# Patient Record
Sex: Female | Born: 1950 | Race: White | Hispanic: No | Marital: Married | Smoking: Former smoker
Health system: Southern US, Community
[De-identification: ages and names within clinical notes are randomized; demographics above are authoritative.]

## PROBLEM LIST (undated history)

## (undated) DIAGNOSIS — K649 Unspecified hemorrhoids: Secondary | ICD-10-CM

## (undated) DIAGNOSIS — K635 Polyp of colon: Secondary | ICD-10-CM

## (undated) DIAGNOSIS — E042 Nontoxic multinodular goiter: Secondary | ICD-10-CM

## (undated) DIAGNOSIS — F32A Depression, unspecified: Secondary | ICD-10-CM

## (undated) DIAGNOSIS — F419 Anxiety disorder, unspecified: Secondary | ICD-10-CM

## (undated) DIAGNOSIS — M159 Polyosteoarthritis, unspecified: Secondary | ICD-10-CM

## (undated) DIAGNOSIS — J449 Chronic obstructive pulmonary disease, unspecified: Secondary | ICD-10-CM

## (undated) DIAGNOSIS — E785 Hyperlipidemia, unspecified: Secondary | ICD-10-CM

## (undated) DIAGNOSIS — D649 Anemia, unspecified: Secondary | ICD-10-CM

## (undated) DIAGNOSIS — F329 Major depressive disorder, single episode, unspecified: Secondary | ICD-10-CM

## (undated) DIAGNOSIS — K5909 Other constipation: Secondary | ICD-10-CM

## (undated) DIAGNOSIS — K219 Gastro-esophageal reflux disease without esophagitis: Secondary | ICD-10-CM

## (undated) DIAGNOSIS — T4145XA Adverse effect of unspecified anesthetic, initial encounter: Secondary | ICD-10-CM

## (undated) DIAGNOSIS — M5030 Other cervical disc degeneration, unspecified cervical region: Secondary | ICD-10-CM

## (undated) DIAGNOSIS — Z720 Tobacco use: Secondary | ICD-10-CM

## (undated) DIAGNOSIS — N6019 Diffuse cystic mastopathy of unspecified breast: Secondary | ICD-10-CM

## (undated) DIAGNOSIS — Z9889 Other specified postprocedural states: Secondary | ICD-10-CM

## (undated) DIAGNOSIS — E039 Hypothyroidism, unspecified: Secondary | ICD-10-CM

## (undated) DIAGNOSIS — K469 Unspecified abdominal hernia without obstruction or gangrene: Secondary | ICD-10-CM

## (undated) DIAGNOSIS — N809 Endometriosis, unspecified: Secondary | ICD-10-CM

## (undated) DIAGNOSIS — T8859XA Other complications of anesthesia, initial encounter: Secondary | ICD-10-CM

## (undated) DIAGNOSIS — G43909 Migraine, unspecified, not intractable, without status migrainosus: Secondary | ICD-10-CM

## (undated) DIAGNOSIS — I10 Essential (primary) hypertension: Secondary | ICD-10-CM

## (undated) HISTORY — DX: Depression, unspecified: F32.A

## (undated) HISTORY — DX: Anxiety disorder, unspecified: F41.9

## (undated) HISTORY — DX: Hyperlipidemia, unspecified: E78.5

## (undated) HISTORY — PX: CARPAL TUNNEL RELEASE: SHX101

## (undated) HISTORY — DX: Essential (primary) hypertension: I10

## (undated) HISTORY — DX: Endometriosis, unspecified: N80.9

## (undated) HISTORY — DX: Migraine, unspecified, not intractable, without status migrainosus: G43.909

## (undated) HISTORY — DX: Polyosteoarthritis, unspecified: M15.9

## (undated) HISTORY — DX: Polyp of colon: K63.5

## (undated) HISTORY — PX: COLONOSCOPY: SHX174

## (undated) HISTORY — DX: Tobacco use: Z72.0

## (undated) HISTORY — PX: POLYPECTOMY: SHX149

## (undated) HISTORY — PX: APPENDECTOMY: SHX54

## (undated) HISTORY — PX: FOOT SURGERY: SHX648

## (undated) HISTORY — PX: HERNIA REPAIR: SHX51

## (undated) HISTORY — DX: Unspecified hemorrhoids: K64.9

## (undated) HISTORY — DX: Gastro-esophageal reflux disease without esophagitis: K21.9

## (undated) HISTORY — DX: Nontoxic multinodular goiter: E04.2

## (undated) HISTORY — PX: TONSILLECTOMY: SUR1361

## (undated) HISTORY — DX: Major depressive disorder, single episode, unspecified: F32.9

## (undated) HISTORY — DX: Diffuse cystic mastopathy of unspecified breast: N60.19

## (undated) HISTORY — DX: Unspecified abdominal hernia without obstruction or gangrene: K46.9

## (undated) HISTORY — PX: ROTATOR CUFF REPAIR: SHX139

## (undated) HISTORY — PX: ANTERIOR CERVICAL DECOMP/DISCECTOMY FUSION: SHX1161

## (undated) HISTORY — PX: ESOPHAGOGASTRODUODENOSCOPY: SHX1529

## (undated) HISTORY — PX: OTHER SURGICAL HISTORY: SHX169

## (undated) HISTORY — PX: ABDOMINAL HYSTERECTOMY: SHX81

## (undated) HISTORY — DX: Other constipation: K59.09

## (undated) HISTORY — DX: Hypothyroidism, unspecified: E03.9

## (undated) HISTORY — PX: TRIGGER FINGER RELEASE: SHX641

## (undated) HISTORY — DX: Other cervical disc degeneration, unspecified cervical region: M50.30

## (undated) HISTORY — DX: Anemia, unspecified: D64.9

## (undated) SURGERY — Surgical Case
Anesthesia: *Unknown

---
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
## 2005-04-17 ENCOUNTER — Encounter: Payer: Self-pay | Admitting: General Practice

## 2005-05-08 ENCOUNTER — Encounter: Payer: Self-pay | Admitting: General Practice

## 2005-06-13 ENCOUNTER — Ambulatory Visit: Payer: Self-pay | Admitting: Internal Medicine

## 2006-02-24 ENCOUNTER — Ambulatory Visit: Payer: Self-pay | Admitting: Otolaryngology

## 2006-04-06 ENCOUNTER — Ambulatory Visit: Payer: Self-pay | Admitting: General Practice

## 2006-04-21 ENCOUNTER — Encounter: Payer: Self-pay | Admitting: General Practice

## 2006-05-08 ENCOUNTER — Encounter: Payer: Self-pay | Admitting: General Practice

## 2006-06-15 ENCOUNTER — Ambulatory Visit: Payer: Self-pay | Admitting: Internal Medicine

## 2006-11-09 ENCOUNTER — Ambulatory Visit: Payer: Self-pay | Admitting: Internal Medicine

## 2007-06-22 ENCOUNTER — Ambulatory Visit: Payer: Self-pay | Admitting: Internal Medicine

## 2007-11-23 ENCOUNTER — Ambulatory Visit: Payer: Self-pay | Admitting: Internal Medicine

## 2008-06-29 ENCOUNTER — Ambulatory Visit: Payer: Self-pay | Admitting: Internal Medicine

## 2009-07-02 ENCOUNTER — Ambulatory Visit: Payer: Self-pay | Admitting: Internal Medicine

## 2010-02-11 ENCOUNTER — Ambulatory Visit: Payer: Self-pay | Admitting: Unknown Physician Specialty

## 2010-06-18 ENCOUNTER — Ambulatory Visit: Payer: Self-pay | Admitting: Otolaryngology

## 2010-07-04 ENCOUNTER — Ambulatory Visit: Payer: Self-pay | Admitting: Internal Medicine

## 2010-11-17 ENCOUNTER — Inpatient Hospital Stay: Payer: Self-pay | Admitting: Surgery

## 2010-11-23 ENCOUNTER — Emergency Department: Payer: Self-pay | Admitting: Emergency Medicine

## 2011-04-04 ENCOUNTER — Ambulatory Visit: Payer: Self-pay | Admitting: Internal Medicine

## 2011-07-10 ENCOUNTER — Ambulatory Visit: Payer: Self-pay | Admitting: Internal Medicine

## 2011-11-27 ENCOUNTER — Ambulatory Visit: Payer: Self-pay | Admitting: Internal Medicine

## 2012-01-21 HISTORY — PX: JOINT REPLACEMENT: SHX530

## 2012-07-14 ENCOUNTER — Ambulatory Visit: Payer: Self-pay | Admitting: Internal Medicine

## 2013-06-15 ENCOUNTER — Ambulatory Visit: Payer: Self-pay | Admitting: Internal Medicine

## 2013-07-15 ENCOUNTER — Ambulatory Visit: Payer: Self-pay | Admitting: Internal Medicine

## 2013-12-27 ENCOUNTER — Ambulatory Visit: Payer: Self-pay | Admitting: Cardiovascular Disease

## 2014-01-17 ENCOUNTER — Encounter: Payer: Self-pay | Admitting: Cardiovascular Disease

## 2014-01-17 ENCOUNTER — Ambulatory Visit (INDEPENDENT_AMBULATORY_CARE_PROVIDER_SITE_OTHER): Payer: BC Managed Care – PPO | Admitting: Cardiovascular Disease

## 2014-01-17 VITALS — BP 131/85 | HR 58 | Ht 60.0 in | Wt 148.2 lb

## 2014-01-17 DIAGNOSIS — I10 Essential (primary) hypertension: Secondary | ICD-10-CM

## 2014-01-17 DIAGNOSIS — R000 Tachycardia, unspecified: Secondary | ICD-10-CM

## 2014-01-17 DIAGNOSIS — F17200 Nicotine dependence, unspecified, uncomplicated: Secondary | ICD-10-CM

## 2014-01-17 DIAGNOSIS — E785 Hyperlipidemia, unspecified: Secondary | ICD-10-CM

## 2014-01-17 DIAGNOSIS — Z720 Tobacco use: Secondary | ICD-10-CM | POA: Insufficient documentation

## 2014-01-17 DIAGNOSIS — R0789 Other chest pain: Secondary | ICD-10-CM | POA: Insufficient documentation

## 2014-01-17 DIAGNOSIS — R079 Chest pain, unspecified: Secondary | ICD-10-CM

## 2014-01-17 NOTE — Progress Notes (Signed)
Primary care physician: Dr. Doy Hutching  HPI  This is a pleasant 63 year old female who is self-referred for evaluation of chest pain. She has no previous cardiac history. She reports having a normal stress test 2 years ago. She has multiple chronic medical conditions that include hypertension, hyperlipidemia, gastroesophageal reflux disease, tobacco use and COPD. She reports recurrent sporadic episodes of chest discomfort over the last 2 years. Had initial episode happened at night and woke her up from sleep. The discomfort was substernal and lasted for less than 1 minute. Since then, she had multiple brief episodes similar in nature. The pain is described as sharp with no radiation. It has no aggravating factors. All the episodes happened at rest and not with physical activities. She does complain of exertional dyspnea and fatigue and had attributed this to prolonged history of smoking. She has no family history of premature coronary artery disease. She works in a bank.  No Known Allergies   No current outpatient prescriptions on file prior to visit.   No current facility-administered medications on file prior to visit.     Past Medical History  Diagnosis Date  . Nontoxic multinodular goiter   . Osteoarthrosis involving, or with mention of more than one site, but not specified as generalized, multiple sites   . Hypothyroidism   . Chronic airway obstruction, not elsewhere classified   . Tobacco use   . Essential hypertension, benign   . Hyperlipidemia      Past Surgical History  Procedure Laterality Date  . Abdominal hysterectomy    . Hernia repair    . Rotator cuff repair Left   . Carpal tunnel release Bilateral   . Trigger finger release    . Thumb surgery       Family History  Problem Relation Age of Onset  . Hypertension Mother   . Clotting disorder Mother   . Hypertension Father      History   Social History  . Marital Status: Married    Spouse Name: N/A   Number of Children: N/A  . Years of Education: N/A   Occupational History  . Not on file.   Social History Main Topics  . Smoking status: Current Every Day Smoker -- 0.25 packs/day for 40 years    Types: Cigarettes  . Smokeless tobacco: Not on file  . Alcohol Use: Yes     Comment: occassional  . Drug Use: No  . Sexual Activity: Not on file   Other Topics Concern  . Not on file   Social History Narrative  . No narrative on file     ROS A 10 point review of system was performed. It is negative other than that mentioned in the history of present illness.   PHYSICAL EXAM   BP 131/85  Pulse 58  Ht 5' (1.524 m)  Wt 148 lb 4 oz (67.246 kg)  BMI 28.95 kg/m2 Constitutional: She is oriented to person, place, and time. She appears well-developed and well-nourished. No distress.  HENT: No nasal discharge.  Head: Normocephalic and atraumatic.  Eyes: Pupils are equal and round. No discharge.  Neck: Normal range of motion. Neck supple. No JVD present. No thyromegaly present.  Cardiovascular: Normal rate, regular rhythm, normal heart sounds. Exam reveals no gallop and no friction rub. No murmur heard.  Pulmonary/Chest: Effort normal and breath sounds normal. No stridor. No respiratory distress. She has no wheezes. She has no rales. She exhibits no tenderness.  Abdominal: Soft. Bowel sounds are normal. She exhibits  no distension. There is no tenderness. There is no rebound and no guarding.  Musculoskeletal: Normal range of motion. She exhibits no edema and no tenderness.  Neurological: She is alert and oriented to person, place, and time. Coordination normal.  Skin: Skin is warm and dry. No rash noted. She is not diaphoretic. No erythema. No pallor.  Psychiatric: She has a normal mood and affect. Her behavior is normal. Judgment and thought content normal.     EKG: Sinus bradycardia with no significant ST or T wave changes.   ASSESSMENT AND PLAN

## 2014-01-17 NOTE — Assessment & Plan Note (Signed)
She is on pravastatin 40 mg once daily.

## 2014-01-17 NOTE — Assessment & Plan Note (Signed)
I discussed smoking cessation. 

## 2014-01-17 NOTE — Assessment & Plan Note (Signed)
The chest pain is overall atypical and has been sporadic over the last 2 years. Physical exam is unremarkable and baseline EKG is normal. Associated symptoms include exertional dyspnea which is more concerning. She has multiple risk factors for coronary artery disease. I recommend evaluation with a treadmill stress test. I discussed with the patient the importance of lifestyle changes in order to decrease the chance of future coronary artery disease and cardiovascular events. We discussed the importance of controlling risk factors, healthy diet , smoking cessation as well as regular exercise. I also explained to him that a normal stress test does not rule out atherosclerosis.

## 2014-01-17 NOTE — Assessment & Plan Note (Signed)
Blood pressure is controlled on losartan. 

## 2014-01-17 NOTE — Patient Instructions (Signed)
Your physician has requested that you have an exercise tolerance test. For further information please visit [email or web address]. Please also follow instruction sheet, as given.  Follow up as needed.

## 2014-02-02 ENCOUNTER — Encounter: Payer: BC Managed Care – PPO | Admitting: Cardiovascular Disease

## 2014-06-26 DIAGNOSIS — F419 Anxiety disorder, unspecified: Secondary | ICD-10-CM

## 2014-06-26 DIAGNOSIS — E042 Nontoxic multinodular goiter: Secondary | ICD-10-CM | POA: Insufficient documentation

## 2014-06-26 DIAGNOSIS — J449 Chronic obstructive pulmonary disease, unspecified: Secondary | ICD-10-CM | POA: Insufficient documentation

## 2014-06-26 DIAGNOSIS — F329 Major depressive disorder, single episode, unspecified: Secondary | ICD-10-CM | POA: Insufficient documentation

## 2014-06-26 DIAGNOSIS — M5030 Other cervical disc degeneration, unspecified cervical region: Secondary | ICD-10-CM | POA: Insufficient documentation

## 2014-06-26 DIAGNOSIS — E039 Hypothyroidism, unspecified: Secondary | ICD-10-CM | POA: Insufficient documentation

## 2014-06-26 DIAGNOSIS — M1990 Unspecified osteoarthritis, unspecified site: Secondary | ICD-10-CM | POA: Insufficient documentation

## 2014-09-21 ENCOUNTER — Ambulatory Visit: Payer: Self-pay | Admitting: Internal Medicine

## 2014-12-10 ENCOUNTER — Ambulatory Visit: Payer: Self-pay | Admitting: Physician Assistant

## 2015-02-23 DIAGNOSIS — K219 Gastro-esophageal reflux disease without esophagitis: Secondary | ICD-10-CM | POA: Insufficient documentation

## 2015-03-26 ENCOUNTER — Ambulatory Visit
Admit: 2015-03-26 | Disposition: A | Discharge status: Home / Self Care | Payer: Self-pay | Attending: Unknown Physician Specialty | Admitting: Unknown Physician Specialty

## 2015-08-09 ENCOUNTER — Ambulatory Visit: Payer: BLUE CROSS/BLUE SHIELD | Admitting: Urology

## 2015-08-22 ENCOUNTER — Ambulatory Visit: Payer: BLUE CROSS/BLUE SHIELD | Admitting: Urology

## 2015-08-27 ENCOUNTER — Ambulatory Visit: Payer: Self-pay | Admitting: Obstetrics and Gynecology

## 2015-08-28 ENCOUNTER — Ambulatory Visit: Payer: Self-pay | Admitting: Obstetrics and Gynecology

## 2016-01-15 ENCOUNTER — Other Ambulatory Visit: Payer: Self-pay | Admitting: Internal Medicine

## 2016-01-15 DIAGNOSIS — Z1231 Encounter for screening mammogram for malignant neoplasm of breast: Secondary | ICD-10-CM

## 2016-01-17 ENCOUNTER — Ambulatory Visit
Admission: RE | Admit: 2016-01-17 | Discharge: 2016-01-17 | Disposition: A | Discharge status: Home / Self Care | Payer: BLUE CROSS/BLUE SHIELD | Source: Ambulatory Visit | Attending: Internal Medicine | Admitting: Internal Medicine

## 2016-01-17 DIAGNOSIS — Z1231 Encounter for screening mammogram for malignant neoplasm of breast: Secondary | ICD-10-CM

## 2016-04-01 ENCOUNTER — Other Ambulatory Visit: Payer: Self-pay | Admitting: Internal Medicine

## 2016-04-01 DIAGNOSIS — R1084 Generalized abdominal pain: Secondary | ICD-10-CM

## 2016-04-08 ENCOUNTER — Ambulatory Visit: Payer: BLUE CROSS/BLUE SHIELD

## 2016-12-12 ENCOUNTER — Other Ambulatory Visit: Payer: Self-pay | Admitting: Internal Medicine

## 2016-12-12 DIAGNOSIS — Z1231 Encounter for screening mammogram for malignant neoplasm of breast: Secondary | ICD-10-CM

## 2017-01-19 ENCOUNTER — Ambulatory Visit
Admission: RE | Admit: 2017-01-19 | Discharge: 2017-01-19 | Disposition: A | Discharge status: Home / Self Care | Payer: BLUE CROSS/BLUE SHIELD | Source: Ambulatory Visit | Attending: Internal Medicine | Admitting: Internal Medicine

## 2017-01-19 DIAGNOSIS — Z1231 Encounter for screening mammogram for malignant neoplasm of breast: Secondary | ICD-10-CM | POA: Insufficient documentation

## 2017-02-06 ENCOUNTER — Other Ambulatory Visit: Payer: Self-pay | Admitting: Internal Medicine

## 2017-02-06 DIAGNOSIS — R1310 Dysphagia, unspecified: Secondary | ICD-10-CM

## 2017-02-06 DIAGNOSIS — K219 Gastro-esophageal reflux disease without esophagitis: Secondary | ICD-10-CM

## 2017-02-11 ENCOUNTER — Other Ambulatory Visit: Payer: Self-pay | Admitting: Internal Medicine

## 2017-02-11 DIAGNOSIS — R1084 Generalized abdominal pain: Secondary | ICD-10-CM

## 2017-02-25 ENCOUNTER — Ambulatory Visit
Admission: RE | Admit: 2017-02-25 | Discharge: 2017-02-25 | Disposition: A | Discharge status: Home / Self Care | Payer: BLUE CROSS/BLUE SHIELD | Source: Ambulatory Visit | Attending: Internal Medicine | Admitting: Internal Medicine

## 2017-02-25 DIAGNOSIS — K760 Fatty (change of) liver, not elsewhere classified: Secondary | ICD-10-CM | POA: Insufficient documentation

## 2017-02-25 DIAGNOSIS — M5136 Other intervertebral disc degeneration, lumbar region: Secondary | ICD-10-CM | POA: Diagnosis not present

## 2017-02-25 DIAGNOSIS — R1084 Generalized abdominal pain: Secondary | ICD-10-CM | POA: Insufficient documentation

## 2017-02-25 LAB — POCT I-STAT CREATININE: CREATININE: 0.6 mg/dL (ref 0.44–1.00)

## 2017-02-25 MED ORDER — IOPAMIDOL (ISOVUE-300) INJECTION 61%
100.0000 mL | Freq: Once | INTRAVENOUS | Status: AC | PRN
  Administered 2017-02-25: 100 mL via INTRAVENOUS

## 2017-02-26 ENCOUNTER — Ambulatory Visit: Payer: BLUE CROSS/BLUE SHIELD

## 2017-03-17 DIAGNOSIS — Z9889 Other specified postprocedural states: Secondary | ICD-10-CM

## 2017-03-17 HISTORY — DX: Other specified postprocedural states: Z98.890

## 2017-03-24 ENCOUNTER — Ambulatory Visit
Admission: RE | Admit: 2017-03-24 | Discharge: 2017-03-24 | Disposition: A | Discharge status: Home / Self Care | Payer: BLUE CROSS/BLUE SHIELD | Source: Ambulatory Visit | Attending: Internal Medicine | Admitting: Internal Medicine

## 2017-03-24 DIAGNOSIS — R1310 Dysphagia, unspecified: Secondary | ICD-10-CM

## 2017-03-24 DIAGNOSIS — K5909 Other constipation: Secondary | ICD-10-CM | POA: Diagnosis not present

## 2017-03-24 DIAGNOSIS — K449 Diaphragmatic hernia without obstruction or gangrene: Secondary | ICD-10-CM | POA: Insufficient documentation

## 2017-03-24 DIAGNOSIS — R1084 Generalized abdominal pain: Secondary | ICD-10-CM | POA: Insufficient documentation

## 2017-03-24 DIAGNOSIS — K219 Gastro-esophageal reflux disease without esophagitis: Secondary | ICD-10-CM

## 2017-04-03 ENCOUNTER — Encounter
Admission: RE | Disposition: A | Discharge status: Home / Self Care | Payer: Self-pay | Source: Ambulatory Visit | Attending: Unknown Physician Specialty

## 2017-04-03 ENCOUNTER — Encounter: Payer: Self-pay | Admitting: *Deleted

## 2017-04-03 ENCOUNTER — Ambulatory Visit: Payer: BLUE CROSS/BLUE SHIELD | Admitting: Anesthesiology

## 2017-04-03 ENCOUNTER — Ambulatory Visit
Admission: RE | Admit: 2017-04-03 | Discharge: 2017-04-03 | Disposition: A | Discharge status: Home / Self Care | Payer: BLUE CROSS/BLUE SHIELD | Source: Ambulatory Visit | Attending: Unknown Physician Specialty | Admitting: Unknown Physician Specialty

## 2017-04-03 DIAGNOSIS — Z9049 Acquired absence of other specified parts of digestive tract: Secondary | ICD-10-CM | POA: Insufficient documentation

## 2017-04-03 DIAGNOSIS — E785 Hyperlipidemia, unspecified: Secondary | ICD-10-CM | POA: Diagnosis not present

## 2017-04-03 DIAGNOSIS — Z79890 Hormone replacement therapy (postmenopausal): Secondary | ICD-10-CM | POA: Diagnosis not present

## 2017-04-03 DIAGNOSIS — Z79899 Other long term (current) drug therapy: Secondary | ICD-10-CM | POA: Insufficient documentation

## 2017-04-03 DIAGNOSIS — K219 Gastro-esophageal reflux disease without esophagitis: Secondary | ICD-10-CM | POA: Diagnosis not present

## 2017-04-03 DIAGNOSIS — I10 Essential (primary) hypertension: Secondary | ICD-10-CM | POA: Diagnosis not present

## 2017-04-03 DIAGNOSIS — F329 Major depressive disorder, single episode, unspecified: Secondary | ICD-10-CM | POA: Diagnosis not present

## 2017-04-03 DIAGNOSIS — R1310 Dysphagia, unspecified: Secondary | ICD-10-CM | POA: Diagnosis present

## 2017-04-03 DIAGNOSIS — Z87891 Personal history of nicotine dependence: Secondary | ICD-10-CM | POA: Insufficient documentation

## 2017-04-03 DIAGNOSIS — F419 Anxiety disorder, unspecified: Secondary | ICD-10-CM | POA: Insufficient documentation

## 2017-04-03 HISTORY — PX: ESOPHAGOGASTRODUODENOSCOPY (EGD) WITH PROPOFOL: SHX5813

## 2017-04-03 HISTORY — DX: Other specified postprocedural states: Z98.890

## 2017-04-03 SURGERY — ESOPHAGOGASTRODUODENOSCOPY (EGD) WITH PROPOFOL
Anesthesia: General

## 2017-04-03 MED ORDER — PROPOFOL 10 MG/ML IV BOLUS
INTRAVENOUS | Status: AC
  Filled 2017-04-03: qty 20

## 2017-04-03 MED ORDER — GENTAMICIN SULFATE 40 MG/ML IJ SOLN
70.0000 mg | Freq: Once | INTRAVENOUS | Status: AC
  Administered 2017-04-03: 70 mg via INTRAVENOUS
  Filled 2017-04-03: qty 1.75

## 2017-04-03 MED ORDER — SODIUM CHLORIDE 0.9 % IV SOLN: INTRAVENOUS | Status: DC

## 2017-04-03 MED ORDER — VANCOMYCIN HCL 500 MG IV SOLR
500.0000 mg | Freq: Once | INTRAVENOUS | Status: AC
  Administered 2017-04-03: 500 mg via INTRAVENOUS
  Filled 2017-04-03: qty 500

## 2017-04-03 MED ORDER — PROPOFOL 10 MG/ML IV BOLUS
INTRAVENOUS | Status: DC | PRN
  Administered 2017-04-03 (×2): 30 mg via INTRAVENOUS
  Administered 2017-04-03: 80 mg via INTRAVENOUS
  Administered 2017-04-03 (×2): 20 mg via INTRAVENOUS

## 2017-04-03 MED ORDER — SODIUM CHLORIDE 0.9 % IV SOLN
INTRAVENOUS | Status: DC
  Administered 2017-04-03: 14:00:00 via INTRAVENOUS

## 2017-04-03 NOTE — Transfer of Care (Signed)
Immediate Anesthesia Transfer of Care Note  Patient: Janet Osborne  Procedure(s) Performed: Procedure(s): ESOPHAGOGASTRODUODENOSCOPY (EGD) WITH PROPOFOL (N/A)  Patient Location: PACU and Endoscopy Unit  Anesthesia Type:General  Level of Consciousness: drowsy and patient cooperative  Airway & Oxygen Therapy: Patient Spontanous Breathing and Patient connected to nasal cannula oxygen  Post-op Assessment: Report given to RN and Post -op Vital signs reviewed and stable  Post vital signs: Reviewed and stable  Last Vitals:  Vitals:   04/03/17 1110 04/03/17 1400  BP: (!) 105/42 (!) 98/53  Pulse: (!) 55 (!) 55  Resp: 18 16  Temp: (!) 35.8 C     Last Pain:  Vitals:   04/03/17 1110  TempSrc: Tympanic         Complications: No apparent anesthesia complications

## 2017-04-03 NOTE — Anesthesia Preprocedure Evaluation (Signed)
Anesthesia Evaluation  Patient identified by MRN, date of birth, ID band Patient awake    Reviewed: Allergy & Precautions, NPO status , Patient's Chart, lab work & pertinent test results  History of Anesthesia Complications Negative for: history of anesthetic complications  Airway Mallampati: II  TM Distance: >3 FB Neck ROM: Full    Dental no notable dental hx.    Pulmonary neg sleep apnea, neg COPD, former smoker,    breath sounds clear to auscultation- rhonchi (-) wheezing      Cardiovascular hypertension, Pt. on medications (-) CAD and (-) Past MI  Rhythm:Regular Rate:Normal - Systolic murmurs and - Diastolic murmurs    Neuro/Psych  Headaches, PSYCHIATRIC DISORDERS Anxiety Depression    GI/Hepatic Neg liver ROS, GERD  ,  Endo/Other  neg diabetesHypothyroidism   Renal/GU negative Renal ROS     Musculoskeletal  (+) Arthritis ,   Abdominal (+) - obese,   Peds  Hematology  (+) anemia ,   Anesthesia Other Findings Past Medical History: No date: Abdominal hernia No date: Anemia No date: Anxiety No date: Chronic constipation No date: Colon polyps No date: DDD (degenerative disc disease), cervical No date: Depression No date: Endometriosis No date: Essential hypertension, benign No date: Fibrocystic breast disease No date: GERD (gastroesophageal reflux disease) 03/17/2017: H/O hand surgery No date: Hemorrhoids No date: Hyperlipidemia No date: Migraine No date: Nontoxic multinodular goiter No date: Osteoarthrosis involving, or with mention of m* No date: Tobacco use   Reproductive/Obstetrics                             Anesthesia Physical Anesthesia Plan  ASA: II  Anesthesia Plan: General   Post-op Pain Management:    Induction: Intravenous  Airway Management Planned: Natural Airway  Additional Equipment:   Intra-op Plan:   Post-operative Plan:   Informed Consent: I  have reviewed the patients History and Physical, chart, labs and discussed the procedure including the risks, benefits and alternatives for the proposed anesthesia with the patient or authorized representative who has indicated his/her understanding and acceptance.   Dental advisory given  Plan Discussed with: CRNA and Anesthesiologist  Anesthesia Plan Comments:         Anesthesia Quick Evaluation

## 2017-04-03 NOTE — H&P (Signed)
Primary Care Physician:  Idelle Crouch, MD Primary Gastroenterologist:  Dr. Vira Agar  Pre-Procedure History & Physical: HPI:  Janet Osborne is a 66 y.o. female is here for an endoscopy.   Past Medical History:  Diagnosis Date  . Abdominal hernia   . Anemia   . Anxiety   . Chronic constipation   . Colon polyps   . DDD (degenerative disc disease), cervical   . Depression   . Endometriosis   . Essential hypertension, benign   . Fibrocystic breast disease   . GERD (gastroesophageal reflux disease)   . H/O hand surgery 03/17/2017  . Hemorrhoids   . Hyperlipidemia   . Migraine   . Nontoxic multinodular goiter   . Osteoarthrosis involving, or with mention of more than one site, but not specified as generalized, multiple sites   . Tobacco use     Past Surgical History:  Procedure Laterality Date  . ABDOMINAL HYSTERECTOMY    . ANTERIOR CERVICAL DECOMP/DISCECTOMY FUSION    . APPENDECTOMY    . CARPAL TUNNEL RELEASE Bilateral   . COLONOSCOPY    . ESOPHAGOGASTRODUODENOSCOPY    . HERNIA REPAIR    . POLYPECTOMY    . ROTATOR CUFF REPAIR Left   . thumb surgery    . TONSILLECTOMY    . TRIGGER FINGER RELEASE Left     Prior to Admission medications   Medication Sig Start Date End Date Taking? Authorizing Provider  acetaminophen (TYLENOL) 325 MG tablet Take 650 mg by mouth every 6 (six) hours as needed.   Yes Historical Provider, MD  Calcium Carbonate (CALCIUM 600 PO) Take 2 tablets by mouth daily.   Yes Historical Provider, MD  diazepam (VALIUM) 5 MG tablet Take 5 mg by mouth every 6 (six) hours as needed for anxiety.   Yes Historical Provider, MD  Docusate Calcium (STOOL SOFTENER PO) Take by mouth 2 (two) times daily.   Yes Historical Provider, MD  estrogens, conjugated, (PREMARIN) 0.3 MG tablet TAKE 1 TABLET BY MOUTH EVERY DAY 05/07/15  Yes Historical Provider, MD  gabapentin (NEURONTIN) 300 MG capsule Take 300 mg by mouth at bedtime.  12/26/13  Yes Historical Provider, MD   hydrochlorothiazide (HYDRODIURIL) 25 MG tablet Take by mouth. 02/27/15 04/03/17 Yes Historical Provider, MD  levothyroxine (SYNTHROID, LEVOTHROID) 75 MCG tablet Take 75 mcg by mouth daily before breakfast.   Yes Historical Provider, MD  losartan (COZAAR) 50 MG tablet Take 50 mg by mouth daily.   Yes Historical Provider, MD  Multiple Vitamins-Minerals (EYE VITAMINS PO) Take by mouth daily.   Yes Historical Provider, MD  NEXIUM 40 MG capsule Take 40 mg by mouth daily.    Yes Historical Provider, MD  pravastatin (PRAVACHOL) 40 MG tablet Take 40 mg by mouth as needed.    Yes Historical Provider, MD  SUMAtriptan (IMITREX) 100 MG tablet Take 100 mg by mouth as needed.  11/09/13  Yes Historical Provider, MD  Coenzyme Q10 (CO Q-10) 200 MG CAPS Take by mouth daily.    Historical Provider, MD  DULoxetine (CYMBALTA) 30 MG capsule Take 30 mg by mouth daily.    Historical Provider, MD  guaiFENesin-codeine (ROBITUSSIN AC) 100-10 MG/5ML syrup Take by mouth. 12/12/14   Historical Provider, MD  Melatonin 10 MG CAPS Take by mouth daily.    Historical Provider, MD    Allergies as of 04/02/2017 - Review Complete 04/02/2017  Allergen Reaction Noted  . Bextra [valdecoxib]  04/02/2017  . Macrodantin [nitrofurantoin macrocrystal]  04/02/2017  . Penicillin  v potassium  04/02/2017    Family History  Problem Relation Age of Onset  . Hypertension Mother   . Clotting disorder Mother   . Diabetes Mother   . Dementia Mother   . Osteoarthritis Mother   . Gallbladder disease Mother   . Hypertension Father   . Prostate cancer Father   . COPD Father   . Osteoarthritis Father   . Gallbladder disease Father   . Colon polyps Father   . Osteoarthritis Sister   . Thyroid disease Sister   . HIV Brother   . Breast cancer Neg Hx     Social History   Social History  . Marital status: Married    Spouse name: N/A  . Number of children: N/A  . Years of education: N/A   Occupational History  . Not on file.   Social  History Main Topics  . Smoking status: Former Smoker    Packs/day: 0.25    Years: 40.00    Types: Cigarettes    Quit date: 01/08/2017  . Smokeless tobacco: Never Used     Comment: Uses nicotine patch  . Alcohol use Yes     Comment: occassional  . Drug use: No  . Sexual activity: Not on file   Other Topics Concern  . Not on file   Social History Narrative  . No narrative on file    Review of Systems: See HPI, otherwise negative ROS  Physical Exam: BP (!) 105/42   Pulse (!) 55   Temp (!) 96.5 F (35.8 C) (Tympanic)   Resp 18   Ht 5\' 1"  (1.549 m)   Wt 66.2 kg (146 lb)   SpO2 100%   BMI 27.59 kg/m  General:   Alert,  pleasant and cooperative in NAD Head:  Normocephalic and atraumatic. Neck:  Supple; no masses or thyromegaly. Lungs:  Clear throughout to auscultation.    Heart:  Regular rate and rhythm. Abdomen:  Soft, nontender and nondistended. Normal bowel sounds, without guarding, and without rebound.   Neurologic:  Alert and  oriented x4;  grossly normal neurologically.  Impression/Plan: Janet Osborne is here for an endoscopy to be performed for dysphagia and stricture on UGI  Risks, benefits, limitations, and alternatives regarding  endoscopy have been reviewed with the patient.  Questions have been answered.  All parties agreeable.   Gaylyn Cheers, MD  04/03/2017, 1:41 PM

## 2017-04-03 NOTE — Op Note (Signed)
[facility]astroenterology Patient Name: Janet Osborne Procedure Date: 04/03/2017 1:41 PM MRN: [id number] Account #: [id number] Date of Birth: 10/04/1951 Admit Type: Outpatient Age: 66 Room: [facility] ENDO ROOM 1 Gender: Female Note Status: Finalized Procedure:            Upper GI endoscopy Indications:          Dysphagia, Abnormal UGI series Providers:            Manya Silvas, MD Referring MD:         Leonie Douglas. Doy Hutching, MD (Referring MD) Medicines:            Propofol per Anesthesia Complications:        No immediate complications. Procedure:            Pre-Anesthesia Assessment:                       - After reviewing the risks and benefits, the patient                        was deemed in satisfactory condition to undergo the                        procedure.                       After obtaining informed consent, the endoscope was                        passed under direct vision. Throughout the procedure,                        the patient's blood pressure, pulse, and oxygen                        saturations were monitored continuously. The Endoscope                        was introduced through the mouth, and advanced to the                        second part of duodenum. The upper GI endoscopy was                        accomplished without difficulty. The patient tolerated                        the procedure well. Findings:      The Z-line was irregular and was found 40 cm from the incisors. A slight       hiatal hernia seen. No stricture or tumor or ulcer seen anywhere. After       exam done A guidewire was placed and the scope was withdrawn. Dilation       was performed with a Savary dilator with no resistance at 16 mm.      Patchy mildly erythematous mucosa without bleeding was found in the       gastric antrum. Biopsies were taken with a cold forceps for histology.       Biopsies were taken with a cold forceps for Helicobacter pylori testing.  The examined duodenum was normal. Impression:           -  Z-line irregular, 40 cm from the incisors. Dilated.                       - Erythematous mucosa in the antrum. Biopsied.                       - Normal examined duodenum. Recommendation:       - Await pathology results. Manya Silvas, MD 04/03/2017 2:02:05 PM This report has been signed electronically. Number of Addenda: 0 Note Initiated On: 04/03/2017 1:41 PM      [facility]

## 2017-04-03 NOTE — Anesthesia Postprocedure Evaluation (Signed)
Anesthesia Post Note  Patient: Janet Osborne  Procedure(s) Performed: Procedure(s) (LRB): ESOPHAGOGASTRODUODENOSCOPY (EGD) WITH PROPOFOL (N/A)  Patient location during evaluation: Endoscopy Anesthesia Type: General Level of consciousness: awake and alert and oriented Pain management: pain level controlled Vital Signs Assessment: post-procedure vital signs reviewed and stable Respiratory status: spontaneous breathing, nonlabored ventilation and respiratory function stable Cardiovascular status: blood pressure returned to baseline and stable Postop Assessment: no signs of nausea or vomiting Anesthetic complications: no     Last Vitals:  Vitals:   04/03/17 1400 04/03/17 1410  BP: (!) 98/53 113/67  Pulse: (!) 53 (!) 59  Resp: 18 19  Temp: 36.1 C     Last Pain:  Vitals:   04/03/17 1400  TempSrc: Tympanic                 Erynn Vaca

## 2017-04-03 NOTE — Anesthesia Post-op Follow-up Note (Cosign Needed)
Anesthesia QCDR form completed.        

## 2017-04-06 ENCOUNTER — Encounter: Payer: Self-pay | Admitting: Unknown Physician Specialty

## 2017-04-06 LAB — SURGICAL PATHOLOGY

## 2017-12-30 ENCOUNTER — Ambulatory Visit: Payer: BLUE CROSS/BLUE SHIELD | Attending: Orthopedic Surgery

## 2017-12-30 DIAGNOSIS — M6281 Muscle weakness (generalized): Secondary | ICD-10-CM | POA: Diagnosis present

## 2017-12-30 DIAGNOSIS — M545 Low back pain: Secondary | ICD-10-CM | POA: Insufficient documentation

## 2017-12-30 DIAGNOSIS — G8929 Other chronic pain: Secondary | ICD-10-CM | POA: Diagnosis present

## 2017-12-31 NOTE — Therapy (Signed)
[facility] [address] [place], [place], [address] Phone: [phone]   Fax:  [phone]  Physical Therapy Evaluation  Patient Details  Name: Janet Osborne MRN: [id number] Date of Birth: 1950-12-16 Referring Provider: Carolynn Sayers   Encounter Date: 12/30/2017  PT End of Session - 12/31/17 1832    Visit Number  1    Number of Visits  9    Date for PT Re-Evaluation  02/24/18    PT Start Time  3295    PT Stop Time  1544    PT Time Calculation (min)  68 min    Activity Tolerance  Patient tolerated treatment well    Behavior During Therapy  [facility] for tasks assessed/performed       Past Medical History:  Diagnosis Date  . Abdominal hernia   . Anemia   . Anxiety   . Chronic constipation   . Colon polyps   . DDD (degenerative disc disease), cervical   . Depression   . Endometriosis   . Essential hypertension, benign   . Fibrocystic breast disease   . GERD (gastroesophageal reflux disease)   . H/O hand surgery 03/17/2017  . Hemorrhoids   . Hyperlipidemia   . Migraine   . Nontoxic multinodular goiter   . Osteoarthrosis involving, or with mention of more than one site, but not specified as generalized, multiple sites   . Tobacco use     Past Surgical History:  Procedure Laterality Date  . ABDOMINAL HYSTERECTOMY    . ANTERIOR CERVICAL DECOMP/DISCECTOMY FUSION    . APPENDECTOMY    . CARPAL TUNNEL RELEASE Bilateral   . COLONOSCOPY    . ESOPHAGOGASTRODUODENOSCOPY    . ESOPHAGOGASTRODUODENOSCOPY (EGD) WITH PROPOFOL N/A 04/03/2017   Procedure: ESOPHAGOGASTRODUODENOSCOPY (EGD) WITH PROPOFOL;  Surgeon: Manya Silvas, MD;  Location: [facility] ENDOSCOPY;  Service: Endoscopy;  Laterality: N/A;  . HERNIA REPAIR    . POLYPECTOMY    . ROTATOR CUFF REPAIR Left   . thumb surgery    . TONSILLECTOMY    . TRIGGER FINGER RELEASE Left     There were no vitals filed for this visit.   Subjective Assessment - 12/31/17 0818     Subjective  Pt. is in current pain 3/10 NPS, but able and willing to perform all tasks for evaluation with little increase in pain. Pain is bilateral, but primarily to L side upper glute/lower back that feels "stabbing" and "pinching". Per pt. she is currently using heat, muscle relaxer, tramadol, ibuprofen, and Tylenol for pain. Pain is aggravated by heavy house work and prolonged sitting/standing. Position of comforts is laying on side with pillow to lean onto stomach. Pt. c/c is pain in L glute and radiating pain down legs that feels like pins and needles. Pain is affecting sleep "not good", "can't get comfortable", and "hard to fall asleep because of legs". Pain has been ongoing for "a couple years", but worsened "couple months ago" and "seems to be getting a little worse". Pt. works at [employer] and needs to sit/stand throughout day. Pt. goals are to improve sleep and get back to general exercise/strengthening.     Pertinent History  Pt. reports ACDF in past, and chronicity of LBP with PT in past for LBP.    Limitations  Sitting;Lifting;House hold activities    Patient Stated Goals  improve sleep and increase strength    Currently in Pain?  Yes    Pain Score  3     Pain  Location  Back    Pain Orientation  Left;Posterior;Lower    Pain Descriptors / Indicators  Grimacing;Pins and needles;Radiating;Stabbing    Pain Type  Chronic pain    Pain Radiating Towards  knee    Pain Onset  More than a month ago    Pain Frequency  Constant    Aggravating Factors   heavy house work    Pain Relieving Factors  heat, more comfortable to lay than sit    Effect of Pain on Daily Activities  pain with prolonged sitting/standing at work.         [facility] PT Assessment - 01/01/18 1242      Assessment   Medical Diagnosis  Low back pain    Prior Therapy  yes      Precautions   Precautions  Other (comment)    Precaution Comments  as tolerated      Balance Screen   Has the patient fallen in the past 6 months  No     Has the patient had a decrease in activity level because of a fear of falling?   Yes      Prior Function   Level of Independence  Independent    Vocation  Full time employment      Cognition   Overall Cognitive Status  Within Functional Limits for tasks assessed       MMT: Hip (R/L)- abd (-4/-4)/ add(-4/-4)/ ext(-4/-4)  Special test: Fadir(-)  Decreased trunk rotation with gait. AROM trunk flexion and extension is painless. Limited segmental mobility noted with forward lumbar flexion. Pt reports some pain with OP during lateral trunk flexion in both directions but particularly to the left.   Reviewed spinal anatomy with patient and discuss spondolisthesis as identified on spinal imaging.    Objective measurements completed on examination: See above findings.    TREATMENT  Performed HEP with patient which included hooklying marches, hooklying bridges, seated repeated thoracic extension, sidelying hip abduction, and sidelying hip adduction. Pt provided written handout with education.         PT Education - 12/31/17 1830    Education provided  Yes    Education Details  pt. given HEP    Person(s) Educated  Patient    Methods  Explanation;Verbal cues;Handout    Comprehension  Verbalized understanding;Returned demonstration          PT Long Term Goals - 12/31/17 1857      PT LONG TERM GOAL #1   Title  Pt. will decrease resting pain from 3/10 to 1/10 for 2 weeks to show decrease in pain to improve sleep.    Baseline  on 1/24 resting pain 3/10 and "cant get comfortable at night"    Time  8    Period  Weeks    Status  New    Target Date  02/24/18      PT LONG TERM GOAL #2   Title  Pt. will decrease MODI by 10 points to show a MCID of perceived function to imporve quality of life.    Baseline  1/23: 32%    Time  8    Period  Weeks    Status  New    Target Date  02/24/18      PT LONG TERM GOAL #3   Title  Pt. will increase gross hip strength to 5/5 MMT so  patient feels safe and confident to return to independent exercise.    Time  8    Period  Weeks  Status  New    Target Date  02/24/18      PT LONG TERM GOAL #4   Title  Pt. will decrease worse pain from 5/10 to 3/10 with weighted activity, so pt. can perform "heavy house hold chores" with less pain.    Baseline  1/24: worse pain 5/10 with heavy house hold chores.    Time  8    Period  Weeks    Status  New    Target Date  02/24/18             Plan - 12/31/17 1835    Clinical Impression Statement  Pt. is a 67 y.o. female pleasant demeanor and engaged in evaluation. Pt. suffers from chronic LBP that has worsened in the last couple months. Pt. main concern is pain in the upper glute/lower back area that radiates mainly down L LE to about knee especially at night. Pain makes it difficult for pt. to fall asleep and stay asleep, and therefore pt. main goal is to decrease pain with sleeping. Pt. has not been able to stay as active secondary to pain. Thus, pt.  denies regular exercise which is a goal of pt. to gain strength and get back to regular exercise. AROM with OP testing revealed: flex WNL, ext diminished thoracic, Lat Flex WNL, and rotation hypermobile lumbar L/R. Strength testing hips revealed grossly 4-/5 MMT. Piriformis and hamstring length WNL. Pt. will benefit from neutral core/multifidus strengthening, improved pelvic rotation, and general LE strengthening.    History and Personal Factors relevant to plan of care:  chronicity of LBP and ACDF     Clinical Presentation  Evolving    Clinical Decision Making  Moderate    Rehab Potential  Fair    PT Frequency  1x / week    PT Duration  8 weeks    PT Treatment/Interventions  Aquatic Therapy;Electrical Stimulation;Iontophoresis 4mg /ml Dexamethasone;Moist Heat;Traction;Functional mobility training;Therapeutic activities;Therapeutic exercise;Balance training;Neuromuscular re-education;Patient/family education;Manual techniques    PT Next  Visit Plan  thomas test/ adavnce core strengthening to seated and standing/ pelvic tilting    PT Home Exercise Plan  Sidelying hip adductin and abduction, hooklying bridges, repeated thoracic extension, supine marching    Consulted and Agree with Plan of Care  Patient       Patient will benefit from skilled therapeutic intervention in order to improve the following deficits and impairments:  Improper body mechanics, Pain, Decreased mobility, Hypermobility, Postural dysfunction, Decreased activity tolerance, Decreased range of motion, Decreased strength, Hypomobility, Impaired perceived functional ability, Impaired flexibility  Visit Diagnosis: Chronic bilateral low back pain without sciatica - Plan: PT plan of care cert/re-cert  Muscle weakness (generalized) - Plan: PT plan of care cert/re-cert     Problem List Patient Active Problem List   Diagnosis Date Noted  . Acid reflux 02/23/2015  . Anxiety and depression 06/26/2014  . CAFL (chronic airflow limitation) ([place]) 06/26/2014  . DDD (degenerative disc disease), cervical 06/26/2014  . Goiter, nontoxic, multinodular 06/26/2014  . Adult hypothyroidism 06/26/2014  . Arthritis, degenerative 06/26/2014  . Chest pain 01/17/2014  . Tobacco use   . Essential hypertension, benign   . Hyperlipidemia     This entire session was performed under direct supervision and direction of a licensed therapist/therapist assistant . I have personally read, edited and approve of the note as written.   Rosario Adie SPT Phillips Grout PT, DPT   Huprich,Jason 01/01/2018, 12:50 PM  [facility]  [address] [place] [place], [place], [address] Phone: [phone]   Fax:  [phone]  Name: Janet Osborne MRN: [id number] Date of Birth: Jul 16, 1951

## 2018-01-06 ENCOUNTER — Ambulatory Visit: Payer: BLUE CROSS/BLUE SHIELD | Admitting: Physical Therapy

## 2018-01-06 ENCOUNTER — Encounter: Payer: Self-pay | Admitting: Physical Therapy

## 2018-01-06 DIAGNOSIS — M6281 Muscle weakness (generalized): Secondary | ICD-10-CM

## 2018-01-06 DIAGNOSIS — M545 Low back pain: Principal | ICD-10-CM

## 2018-01-06 DIAGNOSIS — G8929 Other chronic pain: Secondary | ICD-10-CM

## 2018-01-06 NOTE — Therapy (Signed)
[facility] [address] [place], [place], [address] Phone: [phone]   Fax:  [phone]  Physical Therapy Treatment  Patient Details  Name: Janet Osborne MRN: [id number] Date of Birth: 1951-09-27 Referring Provider: Carolynn Sayers   Encounter Date: 01/06/2018  PT End of Session - 01/06/18 1149    Visit Number  2    Number of Visits  9    Date for PT Re-Evaluation  02/24/18    PT Start Time  0818    PT Stop Time  0917    PT Time Calculation (min)  59 min    Activity Tolerance  Patient tolerated treatment well    Behavior During Therapy  [facility] for tasks assessed/performed       Past Medical History:  Diagnosis Date  . Abdominal hernia   . Anemia   . Anxiety   . Chronic constipation   . Colon polyps   . DDD (degenerative disc disease), cervical   . Depression   . Endometriosis   . Essential hypertension, benign   . Fibrocystic breast disease   . GERD (gastroesophageal reflux disease)   . H/O hand surgery 03/17/2017  . Hemorrhoids   . Hyperlipidemia   . Migraine   . Nontoxic multinodular goiter   . Osteoarthrosis involving, or with mention of more than one site, but not specified as generalized, multiple sites   . Tobacco use     Past Surgical History:  Procedure Laterality Date  . ABDOMINAL HYSTERECTOMY    . ANTERIOR CERVICAL DECOMP/DISCECTOMY FUSION    . APPENDECTOMY    . CARPAL TUNNEL RELEASE Bilateral   . COLONOSCOPY    . ESOPHAGOGASTRODUODENOSCOPY    . ESOPHAGOGASTRODUODENOSCOPY (EGD) WITH PROPOFOL N/A 04/03/2017   Procedure: ESOPHAGOGASTRODUODENOSCOPY (EGD) WITH PROPOFOL;  Surgeon: Manya Silvas, MD;  Location: [facility] ENDOSCOPY;  Service: Endoscopy;  Laterality: N/A;  . HERNIA REPAIR    . POLYPECTOMY    . ROTATOR CUFF REPAIR Left   . thumb surgery    . TONSILLECTOMY    . TRIGGER FINGER RELEASE Left     There were no vitals filed for this visit.  Subjective Assessment - 01/06/18 1142    Subjective   Pt. remarks having a bad weekend mainly in the legs, and "feels like back is going to collapse". Pt. states that over the weekend took "pill for restless leg" felt hungover the next day and plans to discontinue use of medication. Pt. states hips burning with exercise in lateral glute area. Pt. states back felt about the same as usual with pinching in upper left glute.    Pertinent History  Pt. reports ACDF in past, and chronicity of LBP with PT in past for LBP.    Limitations  Sitting;Lifting;House hold activities    Patient Stated Goals  improve sleep and increase strength    Currently in Pain?  Yes    Pain Score  -- no pain score taken    Pain Location  Buttocks    Pain Orientation  Left    Pain Descriptors / Indicators  Other (Comment) pinching    Pain Type  Chronic pain    Pain Onset  More than a month ago    Pain Frequency  Constant    Effect of Pain on Daily Activities  minimal         There.exArdeth Sportsman lying stretching for 30 sec hold- trunk rotation, figure-4 Supine static stretch for 30 sec hold- piriformis stretch and  hip flexor(modified Marcello Moores) Hook lying core bracing for 10 sec hold progressed to adding hip flexor marching for 5 sec hold x10 Hook lying pelvic clock ant/post lateral x15 each Hook lying glute bridge with core brace x10 Sit to stand with core bracing x5 See new HEP (handouts provided).      PT Education - 01/06/18 0957    Education provided  Yes    Education Details  See HEP/ hip flexor and Hyman Bower) Educated  Patient    Methods  Explanation;Demonstration;Handout    Comprehension  Verbalized understanding;Returned demonstration          PT Long Term Goals - 12/31/17 1857      PT LONG TERM GOAL #1   Title  Pt. will decrease resting pain from 3/10 to 1/10 for 2 weeks to show decrease in pain to improve sleep.    Baseline  on 1/24 resting pain 3/10 and "cant get comfortable at night"    Time  8    Period  Weeks    Status   New    Target Date  02/24/18      PT LONG TERM GOAL #2   Title  Pt. will decrease MODI by 10 points to show a MCID of perceived function to imporve quality of life.    Baseline  1/23: 32%    Time  8    Period  Weeks    Status  New    Target Date  02/24/18      PT LONG TERM GOAL #3   Title  Pt. will increase gross hip strength to 5/5 MMT so patient feels safe and confident to return to independent exercise.    Time  8    Period  Weeks    Status  New    Target Date  02/24/18      PT LONG TERM GOAL #4   Title  Pt. will decrease worse pain from 5/10 to 3/10 with weighted activity, so pt. can perform "heavy house hold chores" with less pain.    Baseline  1/24: worse pain 5/10 with heavy house hold chores.    Time  8    Period  Weeks    Status  New    Target Date  02/24/18            Plan - 01/06/18 1150    Clinical Impression Statement  Pt. was presenting with excessive posterior tilt with core bracing exercises secondary to confusion with HEP. Thus, PT reeducated on proper TA activation with neutral pelvis, which pt. was able to understand and demonstrate proper form. Thomas test positive for iliopsoas and rectus R/L with slight IR and abd. Piriformis length still limited as well, but gross ROM is WNL. Pt. instructed to change HEP to only stretch hip flexor/piriformis, progress core bracing, and introduce core bracing into ADL. Pt. able to perform core bracing with sit to stand, but very difficult, will progress next visit.    History and Personal Factors relevant to plan of care:  no comorbidities and only one body system involved.     Clinical Presentation  Stable    Clinical Decision Making  Low    Rehab Potential  Fair    PT Frequency  1x / week    PT Duration  8 weeks    PT Treatment/Interventions  Aquatic Therapy;Electrical Stimulation;Iontophoresis 4mg /ml Dexamethasone;Moist Heat;Traction;Functional mobility training;Therapeutic activities;Therapeutic exercise;Balance  training;Neuromuscular re-education;Patient/family education;Manual techniques    PT Next Visit  Plan  Progress core stability to seated/standing position.  Reassess piriformis/ hip flexor length.      PT Home Exercise Plan  Sidelying hip adductin and abduction, hooklying bridges, repeated thoracic extension, supine marching    Consulted and Agree with Plan of Care  Patient       Patient will benefit from skilled therapeutic intervention in order to improve the following deficits and impairments:  Improper body mechanics, Pain, Decreased mobility, Hypermobility, Postural dysfunction, Decreased activity tolerance, Decreased range of motion, Decreased strength, Hypomobility, Impaired perceived functional ability, Impaired flexibility  Visit Diagnosis: Chronic bilateral low back pain without sciatica  Muscle weakness (generalized)     Problem List Patient Active Problem List   Diagnosis Date Noted  . Acid reflux 02/23/2015  . Anxiety and depression 06/26/2014  . CAFL (chronic airflow limitation) ([place]) 06/26/2014  . DDD (degenerative disc disease), cervical 06/26/2014  . Goiter, nontoxic, multinodular 06/26/2014  . Adult hypothyroidism 06/26/2014  . Arthritis, degenerative 06/26/2014  . Chest pain 01/17/2014  . Tobacco use   . Essential hypertension, benign   . Hyperlipidemia    Pura Spice, PT, DPT # [phone] 01/06/2018, 5:27 PM  [facility] [address] [place], [place], [address] Phone: [phone]   Fax:  [phone]  Name: Janet Osborne MRN: [id number] Date of Birth: [birth year]

## 2018-01-13 ENCOUNTER — Encounter: Payer: Self-pay | Admitting: Physical Therapy

## 2018-01-13 ENCOUNTER — Ambulatory Visit: Payer: BLUE CROSS/BLUE SHIELD | Attending: Orthopedic Surgery | Admitting: Physical Therapy

## 2018-01-13 DIAGNOSIS — M6281 Muscle weakness (generalized): Secondary | ICD-10-CM | POA: Diagnosis present

## 2018-01-13 DIAGNOSIS — G8929 Other chronic pain: Secondary | ICD-10-CM | POA: Diagnosis present

## 2018-01-13 DIAGNOSIS — M545 Low back pain: Secondary | ICD-10-CM | POA: Diagnosis present

## 2018-01-13 NOTE — Therapy (Signed)
[facility] [address] [place], [place], [address] Phone: [phone]   Fax:  [phone]  Physical Therapy Treatment  Patient Details  Name: Janet Osborne MRN: [id number] Date of Birth: 04-24-1951 Referring Provider: Carolynn Sayers   Encounter Date: 01/13/2018  PT End of Session - 01/13/18 1735    Visit Number  3    Number of Visits  9    Date for PT Re-Evaluation  02/24/18    PT Start Time  0826    PT Stop Time  0904    PT Time Calculation (min)  38 min    Activity Tolerance  Patient tolerated treatment well    Behavior During Therapy  [facility] for tasks assessed/performed       Past Medical History:  Diagnosis Date  . Abdominal hernia   . Anemia   . Anxiety   . Chronic constipation   . Colon polyps   . DDD (degenerative disc disease), cervical   . Depression   . Endometriosis   . Essential hypertension, benign   . Fibrocystic breast disease   . GERD (gastroesophageal reflux disease)   . H/O hand surgery 03/17/2017  . Hemorrhoids   . Hyperlipidemia   . Migraine   . Nontoxic multinodular goiter   . Osteoarthrosis involving, or with mention of more than one site, but not specified as generalized, multiple sites   . Tobacco use     Past Surgical History:  Procedure Laterality Date  . ABDOMINAL HYSTERECTOMY    . ANTERIOR CERVICAL DECOMP/DISCECTOMY FUSION    . APPENDECTOMY    . CARPAL TUNNEL RELEASE Bilateral   . COLONOSCOPY    . ESOPHAGOGASTRODUODENOSCOPY    . ESOPHAGOGASTRODUODENOSCOPY (EGD) WITH PROPOFOL N/A 04/03/2017   Procedure: ESOPHAGOGASTRODUODENOSCOPY (EGD) WITH PROPOFOL;  Surgeon: Manya Silvas, MD;  Location: [facility] ENDOSCOPY;  Service: Endoscopy;  Laterality: N/A;  . HERNIA REPAIR    . POLYPECTOMY    . ROTATOR CUFF REPAIR Left   . thumb surgery    . TONSILLECTOMY    . TRIGGER FINGER RELEASE Left     There were no vitals filed for this visit.  Subjective Assessment - 01/13/18 1726    Subjective   Pt. states increased pain in upper back that at worst was 6/10, but lower back pain is diminished. Pt. is worried that current HEP is causing upper back pain. Pt. states HEP makes back aggravated in L side during figure four stretch. Pt. remarks no pain current in LB, legs, and upper back while rested in sitting.    Pertinent History  Pt. reports ACDF in past, and chronicity of LBP with PT in past for LBP.    Limitations  Sitting;Lifting;House hold activities    Patient Stated Goals  improve sleep and increase strength    Currently in Pain?  No/denies    Pain Score  0-No pain    Pain Onset  More than a month ago        There.ex:  Supine LE stretching piriformis, lumbar rotation for 30 sec hold b Hooklying pelvic clock ant/post/lateral x15 each Hooklying core bracing for 10 sec hold x20 (empahsis on not rotating pelvis) Prone press up for 30 sec hold   Manual:  Prone STM to thoracic region for 5 min Supine B UE neural flossing for 5 min     PT Long Term Goals - 12/31/17 1857      PT LONG TERM GOAL #1   Title  Pt.  will decrease resting pain from 3/10 to 1/10 for 2 weeks to show decrease in pain to improve sleep.    Baseline  on 1/24 resting pain 3/10 and "cant get comfortable at night"    Time  8    Period  Weeks    Status  New    Target Date  02/24/18      PT LONG TERM GOAL #2   Title  Pt. will decrease MODI by 10 points to show a MCID of perceived function to imporve quality of life.    Baseline  1/23: 32%    Time  8    Period  Weeks    Status  New    Target Date  02/24/18      PT LONG TERM GOAL #3   Title  Pt. will increase gross hip strength to 5/5 MMT so patient feels safe and confident to return to independent exercise.    Time  8    Period  Weeks    Status  New    Target Date  02/24/18      PT LONG TERM GOAL #4   Title  Pt. will decrease worse pain from 5/10 to 3/10 with weighted activity, so pt. can perform "heavy house hold chores" with less pain.     Baseline  1/24: worse pain 5/10 with heavy house hold chores.    Time  8    Period  Weeks    Status  New    Target Date  02/24/18         Plan - 01/13/18 1746    Clinical Impression Statement  Pt. is very pain focused today despite reporting no pain. She is worried that current exercises are exacerbating pain especially in upper back. Pt. continues to improve with lower back pain, but LE still "can't sit still" and bother pt. LE discomfort possibly neural or circulatory in nature will discuss with pt. next visit as long as back pain remains low. Pt. had WNL P to A mobilizations of lumbar and thoracic spine with no increase in concordant pain. Thoracic pain was concordat with STM to middle/lower musculature bilateral. Pt. had increased neural tension B in UE for radial, median, and ulnar. Thus, nerve glides were perforemd by PT, and will reassess next visit.    Clinical Presentation  Stable    Clinical Decision Making  Low    Rehab Potential  Fair    PT Frequency  1x / week    PT Duration  8 weeks    PT Treatment/Interventions  Aquatic Therapy;Electrical Stimulation;Iontophoresis 4mg /ml Dexamethasone;Moist Heat;Traction;Functional mobility training;Therapeutic activities;Therapeutic exercise;Balance training;Neuromuscular re-education;Patient/family education;Manual techniques    PT Next Visit Plan  Progress core stability to seated/standing position.  Reassess piriformis/ hip flexor length.      PT Home Exercise Plan  Sidelying hip adductin and abduction, hooklying bridges, repeated thoracic extension, supine marching    Consulted and Agree with Plan of Care  Patient       Patient will benefit from skilled therapeutic intervention in order to improve the following deficits and impairments:  Improper body mechanics, Pain, Decreased mobility, Hypermobility, Postural dysfunction, Decreased activity tolerance, Decreased range of motion, Decreased strength, Hypomobility, Impaired perceived  functional ability, Impaired flexibility  Visit Diagnosis: Chronic bilateral low back pain without sciatica  Muscle weakness (generalized)     Problem List Patient Active Problem List   Diagnosis Date Noted  . Acid reflux 02/23/2015  . Anxiety and depression 06/26/2014  . CAFL (chronic  airflow limitation) ([place]) 06/26/2014  . DDD (degenerative disc disease), cervical 06/26/2014  . Goiter, nontoxic, multinodular 06/26/2014  . Adult hypothyroidism 06/26/2014  . Arthritis, degenerative 06/26/2014  . Chest pain 01/17/2014  . Tobacco use   . Essential hypertension, benign   . Hyperlipidemia    Pura Spice, PT, DPT # [phone] 01/14/2018, 6:09 PM  [facility] [address] [place], [place], [address] Phone: [phone]   Fax:  [phone]  Name: Janet Osborne MRN: [id number] Date of Birth: December 19, 1950

## 2018-01-20 ENCOUNTER — Encounter: Payer: Self-pay | Admitting: Physical Therapy

## 2018-01-20 ENCOUNTER — Ambulatory Visit: Payer: BLUE CROSS/BLUE SHIELD | Admitting: Physical Therapy

## 2018-01-20 DIAGNOSIS — M545 Low back pain: Secondary | ICD-10-CM | POA: Diagnosis not present

## 2018-01-20 DIAGNOSIS — M6281 Muscle weakness (generalized): Secondary | ICD-10-CM

## 2018-01-20 DIAGNOSIS — G8929 Other chronic pain: Secondary | ICD-10-CM

## 2018-01-20 NOTE — Therapy (Signed)
[facility] [address] [place], [place], [address] Phone: [phone]   Fax:  [phone]  Physical Therapy Treatment  Patient Details  Name: Janet Osborne MRN: [id number] Date of Birth: Nov 02, 1951 Referring Provider: Carolynn Sayers   Encounter Date: 01/20/2018  PT End of Session - 01/20/18 1201    Visit Number  4    Number of Visits  9    Date for PT Re-Evaluation  02/24/18    PT Start Time  0816    PT Stop Time  0905    PT Time Calculation (min)  49 min    Activity Tolerance  Patient tolerated treatment well    Behavior During Therapy  [facility] for tasks assessed/performed       Past Medical History:  Diagnosis Date  . Abdominal hernia   . Anemia   . Anxiety   . Chronic constipation   . Colon polyps   . DDD (degenerative disc disease), cervical   . Depression   . Endometriosis   . Essential hypertension, benign   . Fibrocystic breast disease   . GERD (gastroesophageal reflux disease)   . H/O hand surgery 03/17/2017  . Hemorrhoids   . Hyperlipidemia   . Migraine   . Nontoxic multinodular goiter   . Osteoarthrosis involving, or with mention of more than one site, but not specified as generalized, multiple sites   . Tobacco use     Past Surgical History:  Procedure Laterality Date  . ABDOMINAL HYSTERECTOMY    . ANTERIOR CERVICAL DECOMP/DISCECTOMY FUSION    . APPENDECTOMY    . CARPAL TUNNEL RELEASE Bilateral   . COLONOSCOPY    . ESOPHAGOGASTRODUODENOSCOPY    . ESOPHAGOGASTRODUODENOSCOPY (EGD) WITH PROPOFOL N/A 04/03/2017   Procedure: ESOPHAGOGASTRODUODENOSCOPY (EGD) WITH PROPOFOL;  Surgeon: Manya Silvas, MD;  Location: [facility] ENDOSCOPY;  Service: Endoscopy;  Laterality: N/A;  . HERNIA REPAIR    . POLYPECTOMY    . ROTATOR CUFF REPAIR Left   . thumb surgery    . TONSILLECTOMY    . TRIGGER FINGER RELEASE Left     There were no vitals filed for this visit.  Subjective Assessment - 01/20/18 1133    Subjective   Pt. states "It's ok" in reference to back pain, and she was sore last night. Pt. remarks having a "rough week" and "bad sleep over the weekend" which she contributes to leg discomfort. Pt. is not overly concerned with back pain today, stating that her legs have been the main problem. The legs are not painful but agitated/discomfort that persist day and night. Pt. states compliance with HEP and having no issues with HEP.    Pertinent History  Pt. reports ACDF in past, and chronicity of LBP with PT in past for LBP.    Limitations  Sitting;Lifting;House hold activities    Patient Stated Goals  improve sleep and increase strength    Currently in Pain?  Yes    Pain Score  2     Pain Location  Back    Pain Orientation  Left    Pain Type  Chronic pain    Pain Onset  More than a month ago       There.ex: Supine Thomas test stretch for 30 sec hold B x2 Hooklying figure 4 piriformis stretch with opposing hip flexion for 30 sec hold x2 Supine IT band stretch for 30 sec hold B Supine core bracing for 5 sec hold progressed to seated with core braced  progressed to standing with core braced for 10x sit to stand(TA activated) Seated pelvic tilt ant/post x50 with focus on finding neutral       PT Education - 01/20/18 1201    Education provided  Yes    Education Details  Pt. given updated stretch routine to do before bed to aid with sleeping    Person(s) Educated  Patient    Methods  Explanation;Demonstration;Handout;Tactile cues;Verbal cues    Comprehension  Verbalized understanding;Returned demonstration          PT Long Term Goals - 12/31/17 1857      PT LONG TERM GOAL #1   Title  Pt. will decrease resting pain from 3/10 to 1/10 for 2 weeks to show decrease in pain to improve sleep.    Baseline  on 1/24 resting pain 3/10 and "cant get comfortable at night"    Time  8    Period  Weeks    Status  New    Target Date  02/24/18      PT LONG TERM GOAL #2   Title  Pt. will decrease MODI by  10 points to show a MCID of perceived function to imporve quality of life.    Baseline  1/23: 32%    Time  8    Period  Weeks    Status  New    Target Date  02/24/18      PT LONG TERM GOAL #3   Title  Pt. will increase gross hip strength to 5/5 MMT so patient feels safe and confident to return to independent exercise.    Time  8    Period  Weeks    Status  New    Target Date  02/24/18      PT LONG TERM GOAL #4   Title  Pt. will decrease worse pain from 5/10 to 3/10 with weighted activity, so pt. can perform "heavy house hold chores" with less pain.    Baseline  1/24: worse pain 5/10 with heavy house hold chores.    Time  8    Period  Weeks    Status  New    Target Date  02/24/18            Plan - 01/20/18 1202    Clinical Impression Statement  Pt. reports decreased back pain today, but mainly concerned with discomfort in legs. Pt. has been given medication for "restless leg", but she is not taking because of side effects. Pt. is very fidgety during treatment which is baseline, and she reports having issue at night with same fidgeting. Thomas stretch and piriformis are restricted B and pt. feels pull with stretch. Pt. is able to reduce "pull" in back today with core bracing, with stretching. Pt. was able to progress core bracing to sit-to-stand exercise today, but this was a difficult task for pt. Likewise, pelvic tilting seated on mat table was difficult for pt. and she noted that her "back feels tight".     Clinical Presentation  Stable    Clinical Decision Making  Low    Rehab Potential  Fair    PT Frequency  1x / week    PT Duration  8 weeks    PT Treatment/Interventions  Aquatic Therapy;Electrical Stimulation;Iontophoresis 4mg /ml Dexamethasone;Moist Heat;Traction;Functional mobility training;Therapeutic activities;Therapeutic exercise;Balance training;Neuromuscular re-education;Patient/family education;Manual techniques    PT Next Visit Plan  Progress core stability to  seated/standing position.  Reassess piriformis/ hip flexor length.  Glute strengthening in ext/abd/add    PT  Home Exercise Plan  Sidelying hip adductin and abduction, hooklying bridges, repeated thoracic extension, supine marching    Consulted and Agree with Plan of Care  Patient       Patient will benefit from skilled therapeutic intervention in order to improve the following deficits and impairments:  Improper body mechanics, Pain, Decreased mobility, Hypermobility, Postural dysfunction, Decreased activity tolerance, Decreased range of motion, Decreased strength, Hypomobility, Impaired perceived functional ability, Impaired flexibility  Visit Diagnosis: Chronic bilateral low back pain without sciatica  Muscle weakness (generalized)     Problem List Patient Active Problem List   Diagnosis Date Noted  . Acid reflux 02/23/2015  . Anxiety and depression 06/26/2014  . CAFL (chronic airflow limitation) ([place]) 06/26/2014  . DDD (degenerative disc disease), cervical 06/26/2014  . Goiter, nontoxic, multinodular 06/26/2014  . Adult hypothyroidism 06/26/2014  . Arthritis, degenerative 06/26/2014  . Chest pain 01/17/2014  . Tobacco use   . Essential hypertension, benign   . Hyperlipidemia    Pura Spice, PT, DPT # [phone] 01/20/2018, 5:54 PM  [facility] [address] [place], [place], [address] Phone: [phone]   Fax:  [phone]  Name: Janet Osborne MRN: [id number] Date of Birth: 11/04/1951

## 2018-01-27 ENCOUNTER — Encounter: Payer: BLUE CROSS/BLUE SHIELD | Admitting: Physical Therapy

## 2018-02-01 ENCOUNTER — Ambulatory Visit: Payer: BLUE CROSS/BLUE SHIELD | Admitting: Physical Therapy

## 2018-02-01 DIAGNOSIS — M545 Low back pain, unspecified: Secondary | ICD-10-CM

## 2018-02-01 DIAGNOSIS — G8929 Other chronic pain: Secondary | ICD-10-CM

## 2018-02-01 DIAGNOSIS — M6281 Muscle weakness (generalized): Secondary | ICD-10-CM

## 2018-02-03 ENCOUNTER — Encounter: Payer: Self-pay | Admitting: Physical Therapy

## 2018-02-03 ENCOUNTER — Ambulatory Visit: Payer: BLUE CROSS/BLUE SHIELD | Admitting: Physical Therapy

## 2018-02-03 DIAGNOSIS — G8929 Other chronic pain: Secondary | ICD-10-CM

## 2018-02-03 DIAGNOSIS — M545 Low back pain: Secondary | ICD-10-CM | POA: Diagnosis not present

## 2018-02-03 DIAGNOSIS — M6281 Muscle weakness (generalized): Secondary | ICD-10-CM

## 2018-02-03 NOTE — Therapy (Signed)
[facility] [address] [place], [place], [address] Phone: [phone]   Fax:  [phone]  Physical Therapy Treatment  Patient Details  Name: Janet Osborne MRN: [id number] Date of Birth: [birth year] Referring Provider: Carolynn Sayers   Encounter Date: 02/01/2018  PT End of Session - 02/03/18 0655    Visit Number  5    Number of Visits  9    Date for PT Re-Evaluation  02/24/18    PT Start Time  0102    PT Stop Time  1611    PT Time Calculation (min)  37 min    Activity Tolerance  Patient tolerated treatment well    Behavior During Therapy  [facility] for tasks assessed/performed       Past Medical History:  Diagnosis Date  . Abdominal hernia   . Anemia   . Anxiety   . Chronic constipation   . Colon polyps   . DDD (degenerative disc disease), cervical   . Depression   . Endometriosis   . Essential hypertension, benign   . Fibrocystic breast disease   . GERD (gastroesophageal reflux disease)   . H/O hand surgery 03/17/2017  . Hemorrhoids   . Hyperlipidemia   . Migraine   . Nontoxic multinodular goiter   . Osteoarthrosis involving, or with mention of more than one site, but not specified as generalized, multiple sites   . Tobacco use     Past Surgical History:  Procedure Laterality Date  . ABDOMINAL HYSTERECTOMY    . ANTERIOR CERVICAL DECOMP/DISCECTOMY FUSION    . APPENDECTOMY    . CARPAL TUNNEL RELEASE Bilateral   . COLONOSCOPY    . ESOPHAGOGASTRODUODENOSCOPY    . ESOPHAGOGASTRODUODENOSCOPY (EGD) WITH PROPOFOL N/A 04/03/2017   Procedure: ESOPHAGOGASTRODUODENOSCOPY (EGD) WITH PROPOFOL;  Surgeon: Manya Silvas, MD;  Location: [facility] ENDOSCOPY;  Service: Endoscopy;  Laterality: N/A;  . HERNIA REPAIR    . POLYPECTOMY    . ROTATOR CUFF REPAIR Left   . thumb surgery    . TONSILLECTOMY    . TRIGGER FINGER RELEASE Left     There were no vitals filed for this visit.  Subjective Assessment - 02/03/18 0650    Subjective   Pt. called PT to report that she has been having several days of increase neck/ back discomfort.  Pt. has tried muscle rubs/ medications but no improvement.  Pt. schedule an appt. for PT today to have muscle pain assessed.     Pertinent History  Pt. reports ACDF in past, and chronicity of LBP with PT in past for LBP.    Limitations  Sitting;Lifting;House hold activities    Patient Stated Goals  improve sleep and increase strength    Currently in Pain?  Yes    Pain Score  4     Pain Location  Neck    Pain Orientation  Right    Pain Descriptors / Indicators  Aching    Pain Type  Chronic pain         Supine cervical ROM assessment.  Prone STM to B UT region and L UT trigger point dry needling (3 needles) to 2 trigger points.  Good tx. Tolerance.    No charge for tx. Visit. Today.      PT Long Term Goals - 12/31/17 1857      PT LONG TERM GOAL #1   Title  Pt. will decrease resting pain from 3/10 to 1/10 for 2 weeks to show decrease in pain  to improve sleep.    Baseline  on 1/24 resting pain 3/10 and "cant get comfortable at night"    Time  8    Period  Weeks    Status  New    Target Date  02/24/18      PT LONG TERM GOAL #2   Title  Pt. will decrease MODI by 10 points to show a MCID of perceived function to imporve quality of life.    Baseline  1/23: 32%    Time  8    Period  Weeks    Status  New    Target Date  02/24/18      PT LONG TERM GOAL #3   Title  Pt. will increase gross hip strength to 5/5 MMT so patient feels safe and confident to return to independent exercise.    Time  8    Period  Weeks    Status  New    Target Date  02/24/18      PT LONG TERM GOAL #4   Title  Pt. will decrease worse pain from 5/10 to 3/10 with weighted activity, so pt. can perform "heavy house hold chores" with less pain.    Baseline  1/24: worse pain 5/10 with heavy house hold chores.    Time  8    Period  Weeks    Status  New    Target Date  02/24/18            Plan - 02/03/18  0701    Clinical Impression Statement  Pt. low back musculature/ pain is doing well today but increase upper back/ L UT trigger points (muscle tightness noted).  Pt. benefited from trigger point dry needling/ STM to L UT region to decrease tightness/ pain with palpation.  Good tx. tolerance with dry needling/ manual tx.  Pt. reports decrease in neck/UT pain after tx. session.      Clinical Presentation  Stable    Clinical Decision Making  Low    Rehab Potential  Fair    PT Frequency  1x / week    PT Duration  8 weeks    PT Treatment/Interventions  Aquatic Therapy;Electrical Stimulation;Iontophoresis 4mg /ml Dexamethasone;Moist Heat;Traction;Functional mobility training;Therapeutic activities;Therapeutic exercise;Balance training;Neuromuscular re-education;Patient/family education;Manual techniques    PT Next Visit Plan  Progress core stability to seated/standing position.  Reassess piriformis/ hip flexor length.  Glute strengthening in ext/abd/add    PT Home Exercise Plan  Sidelying hip adductin and abduction, hooklying bridges, repeated thoracic extension, supine marching       Patient will benefit from skilled therapeutic intervention in order to improve the following deficits and impairments:  Improper body mechanics, Pain, Decreased mobility, Hypermobility, Postural dysfunction, Decreased activity tolerance, Decreased range of motion, Decreased strength, Hypomobility, Impaired perceived functional ability, Impaired flexibility  Visit Diagnosis: Chronic bilateral low back pain without sciatica  Muscle weakness (generalized)     Problem List Patient Active Problem List   Diagnosis Date Noted  . Acid reflux 02/23/2015  . Anxiety and depression 06/26/2014  . CAFL (chronic airflow limitation) ([place]) 06/26/2014  . DDD (degenerative disc disease), cervical 06/26/2014  . Goiter, nontoxic, multinodular 06/26/2014  . Adult hypothyroidism 06/26/2014  . Arthritis, degenerative 06/26/2014  .  Chest pain 01/17/2014  . Tobacco use   . Essential hypertension, benign   . Hyperlipidemia    Pura Spice, PT, DPT # [phone] 02/03/2018, 7:06 AM  [facility] [address] [place], [place], [address]  Phone: [phone]   Fax:  [phone]  Name: Janet Osborne MRN: [id number] Date of Birth: 1951/09/25

## 2018-02-03 NOTE — Therapy (Signed)
[facility] [address] [place], [place], [address] Phone: [phone]   Fax:  [phone]  Physical Therapy Treatment  Patient Details  Name: Janet Osborne MRN: [id number] Date of Birth: Oct 19, 1951 Referring Provider: Carolynn Sayers   Encounter Date: 02/03/2018  PT End of Session - 02/03/18 1811    Visit Number  6    Number of Visits  9    Date for PT Re-Evaluation  02/24/18    PT Start Time  0810    PT Stop Time  0901    PT Time Calculation (min)  51 min    Activity Tolerance  Patient tolerated treatment well    Behavior During Therapy  [facility] for tasks assessed/performed       Past Medical History:  Diagnosis Date  . Abdominal hernia   . Anemia   . Anxiety   . Chronic constipation   . Colon polyps   . DDD (degenerative disc disease), cervical   . Depression   . Endometriosis   . Essential hypertension, benign   . Fibrocystic breast disease   . GERD (gastroesophageal reflux disease)   . H/O hand surgery 03/17/2017  . Hemorrhoids   . Hyperlipidemia   . Migraine   . Nontoxic multinodular goiter   . Osteoarthrosis involving, or with mention of more than one site, but not specified as generalized, multiple sites   . Tobacco use     Past Surgical History:  Procedure Laterality Date  . ABDOMINAL HYSTERECTOMY    . ANTERIOR CERVICAL DECOMP/DISCECTOMY FUSION    . APPENDECTOMY    . CARPAL TUNNEL RELEASE Bilateral   . COLONOSCOPY    . ESOPHAGOGASTRODUODENOSCOPY    . ESOPHAGOGASTRODUODENOSCOPY (EGD) WITH PROPOFOL N/A 04/03/2017   Procedure: ESOPHAGOGASTRODUODENOSCOPY (EGD) WITH PROPOFOL;  Surgeon: Manya Silvas, MD;  Location: [facility] ENDOSCOPY;  Service: Endoscopy;  Laterality: N/A;  . HERNIA REPAIR    . POLYPECTOMY    . ROTATOR CUFF REPAIR Left   . thumb surgery    . TONSILLECTOMY    . TRIGGER FINGER RELEASE Left     There were no vitals filed for this visit.  Subjective Assessment - 02/03/18 1807    Subjective   Pt. has no c/o neck pain today and reports reduced back pain. Pt. main concern is "legs restless" at night. Pt. is compliant with HEP and has tried stretching before bed to reduce leg discomfort with no relief.    Pertinent History  Pt. reports ACDF in past, and chronicity of LBP with PT in past for LBP.    Limitations  Sitting;Lifting;House hold activities    Patient Stated Goals  improve sleep and increase strength    Currently in Pain?  Yes    Pain Score  1     Pain Location  Back    Pain Orientation  Lower;Medial    Pain Onset  More than a month ago       There.ex:  Supine Thomas test stretch for 30 sec hold B x2 Hooklying figure 4 piriformis stretch with opposing hip flexion for 30 sec hold x2 Supine IT band stretch for 30 sec hold B Supine core bracing for 5 sec hold x10 braced progressed to heel march x10 and then to standing 1x Reviewed HEP in depth.  Discussed dry needling from last PT session.     PT Long Term Goals - 12/31/17 1857      PT LONG TERM GOAL #1   Title  Pt. will decrease resting pain from 3/10 to 1/10 for 2 weeks to show decrease in pain to improve sleep.    Baseline  on 1/24 resting pain 3/10 and "cant get comfortable at night"    Time  8    Period  Weeks    Status  New    Target Date  02/24/18      PT LONG TERM GOAL #2   Title  Pt. will decrease MODI by 10 points to show a MCID of perceived function to imporve quality of life.    Baseline  1/23: 32%    Time  8    Period  Weeks    Status  New    Target Date  02/24/18      PT LONG TERM GOAL #3   Title  Pt. will increase gross hip strength to 5/5 MMT so patient feels safe and confident to return to independent exercise.    Time  8    Period  Weeks    Status  New    Target Date  02/24/18      PT LONG TERM GOAL #4   Title  Pt. will decrease worse pain from 5/10 to 3/10 with weighted activity, so pt. can perform "heavy house hold chores" with less pain.    Baseline  1/24: worse pain 5/10 with heavy  house hold chores.    Time  8    Period  Weeks    Status  New    Target Date  02/24/18            Plan - 02/03/18 1811    Clinical Impression Statement  Pt. upper trap neck pain is under control today and reduced back pain. PT marked decreased tension with piriformis stretch and pt. was able to brace core during stretch. PT noted tension with Thomas stretch B with R>L. Pt. continues to benefit from core bracing progression despite having increased difficulty today with drawing in maneuver.     Clinical Presentation  Stable    Clinical Decision Making  Low    Rehab Potential  Fair    PT Frequency  1x / week    PT Duration  8 weeks    PT Treatment/Interventions  Aquatic Therapy;Electrical Stimulation;Iontophoresis 4mg /ml Dexamethasone;Moist Heat;Traction;Functional mobility training;Therapeutic activities;Therapeutic exercise;Balance training;Neuromuscular re-education;Patient/family education;Manual techniques    PT Next Visit Plan  Progress core stability to seated/standing position.  Reassess piriformis/ hip flexor length.  Glute strengthening in ext/abd/add    PT Home Exercise Plan  Sidelying hip adductin and abduction, hooklying bridges, repeated thoracic extension, supine marching       Patient will benefit from skilled therapeutic intervention in order to improve the following deficits and impairments:  Improper body mechanics, Pain, Decreased mobility, Hypermobility, Postural dysfunction, Decreased activity tolerance, Decreased range of motion, Decreased strength, Hypomobility, Impaired perceived functional ability, Impaired flexibility  Visit Diagnosis: Chronic bilateral low back pain without sciatica  Muscle weakness (generalized)     Problem List Patient Active Problem List   Diagnosis Date Noted  . Acid reflux 02/23/2015  . Anxiety and depression 06/26/2014  . CAFL (chronic airflow limitation) ([place]) 06/26/2014  . DDD (degenerative disc disease), cervical 06/26/2014   . Goiter, nontoxic, multinodular 06/26/2014  . Adult hypothyroidism 06/26/2014  . Arthritis, degenerative 06/26/2014  . Chest pain 01/17/2014  . Tobacco use   . Essential hypertension, benign   . Hyperlipidemia    Pura Spice, PT, DPT # [place], SPT 02/03/2018, 6:18 PM  [facility] [address] [place], [place], [address] Phone: [phone]   Fax:  [phone]  Name: Janet Osborne MRN: [id number] Date of Birth: 22-Mar-1951

## 2018-02-10 ENCOUNTER — Ambulatory Visit: Payer: BLUE CROSS/BLUE SHIELD | Attending: Orthopedic Surgery | Admitting: Physical Therapy

## 2018-02-10 DIAGNOSIS — M6281 Muscle weakness (generalized): Secondary | ICD-10-CM | POA: Diagnosis present

## 2018-02-10 DIAGNOSIS — G8929 Other chronic pain: Secondary | ICD-10-CM | POA: Diagnosis present

## 2018-02-10 DIAGNOSIS — M545 Low back pain, unspecified: Secondary | ICD-10-CM

## 2018-02-10 NOTE — Therapy (Signed)
[facility] [address] [place], [place], [address] Phone: [phone]   Fax:  [phone]  Physical Therapy Treatment  Patient Details  Name: Janet Osborne MRN: [id number] Date of Birth: 12-26-1950 Referring Provider: Carolynn Sayers   Encounter Date: 02/10/2018  Treatment 7 of 9.   Recert date: 05/04/40  Past Medical History:  Diagnosis Date  . Abdominal hernia   . Anemia   . Anxiety   . Chronic constipation   . Colon polyps   . DDD (degenerative disc disease), cervical   . Depression   . Endometriosis   . Essential hypertension, benign   . Fibrocystic breast disease   . GERD (gastroesophageal reflux disease)   . H/O hand surgery 03/17/2017  . Hemorrhoids   . Hyperlipidemia   . Migraine   . Nontoxic multinodular goiter   . Osteoarthrosis involving, or with mention of more than one site, but not specified as generalized, multiple sites   . Tobacco use     Past Surgical History:  Procedure Laterality Date  . ABDOMINAL HYSTERECTOMY    . ANTERIOR CERVICAL DECOMP/DISCECTOMY FUSION    . APPENDECTOMY    . CARPAL TUNNEL RELEASE Bilateral   . COLONOSCOPY    . ESOPHAGOGASTRODUODENOSCOPY    . ESOPHAGOGASTRODUODENOSCOPY (EGD) WITH PROPOFOL N/A 04/03/2017   Procedure: ESOPHAGOGASTRODUODENOSCOPY (EGD) WITH PROPOFOL;  Surgeon: Manya Silvas, MD;  Location: [facility] ENDOSCOPY;  Service: Endoscopy;  Laterality: N/A;  . HERNIA REPAIR    . POLYPECTOMY    . ROTATOR CUFF REPAIR Left   . thumb surgery    . TONSILLECTOMY    . TRIGGER FINGER RELEASE Left     There were no vitals filed for this visit.    Pt. reports minimal (1/10) L low back pain currently at rest. Pt. states she is having more discomfort in L UT region.       There.ex:  Supine Thomas test stretch for 30 sec hold B x2 Hooklying figure 4 piriformis stretch with opposing hip flexion for 30 sec hold x2 Supine IT band stretch for 30 sec hold B Supine core bracing:  SLR/ bridging/ trunk rotn./ hip abd. With manual resistance/ bicycle kicks 20x.   Prone hip extension 5x.   Discussed HEP/ Silver Sneakers ex. Program at [facility] lumbar discomfort/ tenderness but no increase c/o pain with core ex.  Pt. progressing well with core stability ex. program.  Increase hamstring/ piriformis/ ITB flexibility during tx. session today.      PT Long Term Goals - 12/31/17 1857      PT LONG TERM GOAL #1   Title  Pt. will decrease resting pain from 3/10 to 1/10 for 2 weeks to show decrease in pain to improve sleep.    Baseline  on 1/24 resting pain 3/10 and "cant get comfortable at night"    Time  8    Period  Weeks    Status  New    Target Date  02/24/18      PT LONG TERM GOAL #2   Title  Pt. will decrease MODI by 10 points to show a MCID of perceived function to imporve quality of life.    Baseline  1/23: 32%    Time  8    Period  Weeks    Status  New    Target Date  02/24/18      PT LONG TERM GOAL #3   Title  Pt.  will increase gross hip strength to 5/5 MMT so patient feels safe and confident to return to independent exercise.    Time  8    Period  Weeks    Status  New    Target Date  02/24/18      PT LONG TERM GOAL #4   Title  Pt. will decrease worse pain from 5/10 to 3/10 with weighted activity, so pt. can perform "heavy house hold chores" with less pain.    Baseline  1/24: worse pain 5/10 with heavy house hold chores.    Time  8    Period  Weeks    Status  New    Target Date  02/24/18            Plan - 02/17/18 0831    Clinical Presentation  Stable    Clinical Decision Making  Low    Rehab Potential  Fair    PT Frequency  1x / week    PT Duration  8 weeks    PT Treatment/Interventions  Aquatic Therapy;Electrical Stimulation;Iontophoresis 4mg /ml Dexamethasone;Moist Heat;Traction;Functional mobility training;Therapeutic activities;Therapeutic exercise;Balance training;Neuromuscular re-education;Patient/family education;Manual  techniques    PT Next Visit Plan  Progress core stability to seated/standing position.  Reassess piriformis/ hip flexor length.  Glute strengthening in ext/abd/add    PT Home Exercise Plan  Sidelying hip adductin and abduction, hooklying bridges, repeated thoracic extension, supine marching       Patient will benefit from skilled therapeutic intervention in order to improve the following deficits and impairments:  Improper body mechanics, Pain, Decreased mobility, Hypermobility, Postural dysfunction, Decreased activity tolerance, Decreased range of motion, Decreased strength, Hypomobility, Impaired perceived functional ability, Impaired flexibility  Visit Diagnosis: Chronic bilateral low back pain without sciatica  Muscle weakness (generalized)     Problem List Patient Active Problem List   Diagnosis Date Noted  . Acid reflux 02/23/2015  . Anxiety and depression 06/26/2014  . CAFL (chronic airflow limitation) ([place]) 06/26/2014  . DDD (degenerative disc disease), cervical 06/26/2014  . Goiter, nontoxic, multinodular 06/26/2014  . Adult hypothyroidism 06/26/2014  . Arthritis, degenerative 06/26/2014  . Chest pain 01/17/2014  . Tobacco use   . Essential hypertension, benign   . Hyperlipidemia    Pura Spice, PT, DPT # [phone] 02/17/2018, 8:33 AM  [facility] [address] [place], [place], [address] Phone: [phone]   Fax:  [phone]  Name: Janet Osborne MRN: [id number] Date of Birth: [birth year]

## 2018-02-17 ENCOUNTER — Encounter: Payer: Self-pay | Admitting: Physical Therapy

## 2018-02-17 ENCOUNTER — Ambulatory Visit: Payer: BLUE CROSS/BLUE SHIELD | Admitting: Physical Therapy

## 2018-02-17 DIAGNOSIS — M6281 Muscle weakness (generalized): Secondary | ICD-10-CM

## 2018-02-17 DIAGNOSIS — M545 Low back pain: Principal | ICD-10-CM

## 2018-02-17 DIAGNOSIS — G8929 Other chronic pain: Secondary | ICD-10-CM

## 2018-02-17 NOTE — Therapy (Signed)
[facility] [address] [place], [place], [address] Phone: [phone]   Fax:  [phone]  Physical Therapy Treatment  Patient Details  Name: Janet Osborne MRN: [id number] Date of Birth: 04-08-1951 Referring Provider: Carolynn Sayers   Encounter Date: 02/17/2018  PT End of Session - 02/17/18 0914    Visit Number  8    Number of Visits  9    Date for PT Re-Evaluation  02/24/18    PT Start Time  0812    PT Stop Time  0913    PT Time Calculation (min)  61 min    Activity Tolerance  Patient tolerated treatment well    Behavior During Therapy  [facility] for tasks assessed/performed       Past Medical History:  Diagnosis Date  . Abdominal hernia   . Anemia   . Anxiety   . Chronic constipation   . Colon polyps   . DDD (degenerative disc disease), cervical   . Depression   . Endometriosis   . Essential hypertension, benign   . Fibrocystic breast disease   . GERD (gastroesophageal reflux disease)   . H/O hand surgery 03/17/2017  . Hemorrhoids   . Hyperlipidemia   . Migraine   . Nontoxic multinodular goiter   . Osteoarthrosis involving, or with mention of more than one site, but not specified as generalized, multiple sites   . Tobacco use     Past Surgical History:  Procedure Laterality Date  . ABDOMINAL HYSTERECTOMY    . ANTERIOR CERVICAL DECOMP/DISCECTOMY FUSION    . APPENDECTOMY    . CARPAL TUNNEL RELEASE Bilateral   . COLONOSCOPY    . ESOPHAGOGASTRODUODENOSCOPY    . ESOPHAGOGASTRODUODENOSCOPY (EGD) WITH PROPOFOL N/A 04/03/2017   Procedure: ESOPHAGOGASTRODUODENOSCOPY (EGD) WITH PROPOFOL;  Surgeon: Manya Silvas, MD;  Location: [facility] ENDOSCOPY;  Service: Endoscopy;  Laterality: N/A;  . HERNIA REPAIR    . POLYPECTOMY    . ROTATOR CUFF REPAIR Left   . thumb surgery    . TONSILLECTOMY    . TRIGGER FINGER RELEASE Left     There were no vitals filed for this visit.  Subjective Assessment - 02/17/18 0910    Subjective   Patient reports "pinch" in lower back, discussed possibility of MRI; patient also reported some shoulder pain and intermittent N/T running down arm triggered by holding cell phone/ipad.    Pertinent History  Pt. reports ACDF in past, and chronicity of LBP with PT in past for LBP.    Limitations  Sitting;Lifting;House hold activities    Patient Stated Goals  improve sleep and increase strength    Currently in Pain?  Yes    Pain Score  2     Pain Location  Back    Pain Orientation  Lower    Pain Descriptors / Indicators  Aching    Pain Type  Chronic pain    Pain Onset  More than a month ago    Pain Frequency  Constant        Patient reported considering MRI; patient educated on utility of MRI; patient expressed desire to avoid sx   Patient complaint of numbness in LUE and expressed concern re: carpal tunnel. ULNTT:  Mild + Median 1, Radial, Ulnar + Phalen's, Reverse Phalen's after 30 sec (mild) - Tinel's at cubital and carpal tunnel  Pt educated on neurodynamic "sliders" x 10 ea.  Piriformis stretch in supine: patient able to achieve figure 4 position indicating good piriforms  length  Thomas test: + for hip flexor tightness on L (-2 deg. ext) - for rectus involvement  Seated pelvic tucks 2 x 10 Pelvic tucks on ball 2 x 10 AP, 2 x 10 lateral 4-way hip kickers 2 x 10 with yellow theraband - added to HEP; patient required mod cues for deep core activation  Manual therapy:  Assessed lumbar spine: mild hypomobility/tenderness to UPAs on R; QL tightness on L  STM to R QL x 3 minutes Grade I-II UPAs to R L5-L1 20 oscillations each   Patient demonstrated reduced tenderness and better core activation following manual therapy.      PT Education - 02/17/18 0913    Education provided  Yes    Education Details  Utility of MRI; HEP hip exercises; importance of core engagement; UE nerve glides    Person(s) Educated  Patient    Methods  Explanation;Demonstration;Handout;Verbal  cues;Tactile cues    Comprehension  Verbalized understanding;Tactile cues required;Returned demonstration;Verbal cues required          PT Long Term Goals - 12/31/17 1857      PT LONG TERM GOAL #1   Title  Pt. will decrease resting pain from 3/10 to 1/10 for 2 weeks to show decrease in pain to improve sleep.    Baseline  on 1/24 resting pain 3/10 and "cant get comfortable at night"    Time  8    Period  Weeks    Status  New    Target Date  02/24/18      PT LONG TERM GOAL #2   Title  Pt. will decrease MODI by 10 points to show a MCID of perceived function to imporve quality of life.    Baseline  1/23: 32%    Time  8    Period  Weeks    Status  New    Target Date  02/24/18      PT LONG TERM GOAL #3   Title  Pt. will increase gross hip strength to 5/5 MMT so patient feels safe and confident to return to independent exercise.    Time  8    Period  Weeks    Status  New    Target Date  02/24/18      PT LONG TERM GOAL #4   Title  Pt. will decrease worse pain from 5/10 to 3/10 with weighted activity, so pt. can perform "heavy house hold chores" with less pain.    Baseline  1/24: worse pain 5/10 with heavy house hold chores.    Time  8    Period  Weeks    Status  New    Target Date  02/24/18            Plan - 02/17/18 0916    Clinical Impression Statement  Pt demonstrates mild neural symptoms in LUE, improved with ulnar, median 1, and radial "slider" neurodynamic exercises. Piriformis length appears normal. Impaired flexibility in LLE (-2 deg. ext in [place] test) and mildly hypomobile/tender to grade I-II R UPAs in lumbar region. Core dynamic stability improving but patient still requires moderate cueing to maintain abdominal core engagement. Patient will continue to benefit from skilled physical therapy to improve core dynamic stability and functional mobility with ADLs.    Clinical Presentation  Stable    Clinical Decision Making  Low    Rehab Potential  Fair    PT  Frequency  1x / week    PT Duration  8 weeks  PT Treatment/Interventions  Aquatic Therapy;Electrical Stimulation;Iontophoresis 4mg /ml Dexamethasone;Moist Heat;Traction;Functional mobility training;Therapeutic activities;Therapeutic exercise;Balance training;Neuromuscular re-education;Patient/family education;Manual techniques    PT Next Visit Plan  Re-cert; progress core stability to seated over ball/standing/qped; progress hip strengthening/stretching as tolerated    PT Home Exercise Plan  Sidelying hip adductin and abduction, hooklying bridges, repeated thoracic extension, supine marching    Consulted and Agree with Plan of Care  Patient       Patient will benefit from skilled therapeutic intervention in order to improve the following deficits and impairments:  Improper body mechanics, Pain, Decreased mobility, Hypermobility, Postural dysfunction, Decreased activity tolerance, Decreased range of motion, Decreased strength, Hypomobility, Impaired perceived functional ability, Impaired flexibility  Visit Diagnosis: Chronic bilateral low back pain without sciatica  Muscle weakness (generalized)     Problem List Patient Active Problem List   Diagnosis Date Noted  . Acid reflux 02/23/2015  . Anxiety and depression 06/26/2014  . CAFL (chronic airflow limitation) ([place]) 06/26/2014  . DDD (degenerative disc disease), cervical 06/26/2014  . Goiter, nontoxic, multinodular 06/26/2014  . Adult hypothyroidism 06/26/2014  . Arthritis, degenerative 06/26/2014  . Chest pain 01/17/2014  . Tobacco use   . Essential hypertension, benign   . Hyperlipidemia    Pura Spice, PT, DPT # [phone] 02/17/2018, 9:58 AM  [facility] [address] [place], [place], [address] Phone: [phone]   Fax:  [phone]  Name: Akirah Storck MRN: [id number] Date of Birth: [birth year]

## 2018-02-24 ENCOUNTER — Encounter: Payer: Self-pay | Admitting: Physical Therapy

## 2018-02-24 ENCOUNTER — Ambulatory Visit: Payer: BLUE CROSS/BLUE SHIELD | Admitting: Physical Therapy

## 2018-02-24 DIAGNOSIS — M545 Low back pain, unspecified: Secondary | ICD-10-CM

## 2018-02-24 DIAGNOSIS — G8929 Other chronic pain: Secondary | ICD-10-CM

## 2018-02-24 DIAGNOSIS — M6281 Muscle weakness (generalized): Secondary | ICD-10-CM

## 2018-02-24 NOTE — Therapy (Signed)
[facility] [address] [place], [place], [address] Phone: [phone]   Fax:  [phone]  Physical Therapy Treatment  Patient Details  Name: Janet Osborne MRN: [id number] Date of Birth: [birth year] Referring Provider: Carolynn Sayers   Encounter Date: 02/24/2018    Treatment 9 of 9.  Discharge: 02/24/18   Past Medical History:  Diagnosis Date  . Abdominal hernia   . Anemia   . Anxiety   . Chronic constipation   . Colon polyps   . DDD (degenerative disc disease), cervical   . Depression   . Endometriosis   . Essential hypertension, benign   . Fibrocystic breast disease   . GERD (gastroesophageal reflux disease)   . H/O hand surgery 03/17/2017  . Hemorrhoids   . Hyperlipidemia   . Migraine   . Nontoxic multinodular goiter   . Osteoarthrosis involving, or with mention of more than one site, but not specified as generalized, multiple sites   . Tobacco use     Past Surgical History:  Procedure Laterality Date  . ABDOMINAL HYSTERECTOMY    . ANTERIOR CERVICAL DECOMP/DISCECTOMY FUSION    . APPENDECTOMY    . CARPAL TUNNEL RELEASE Bilateral   . COLONOSCOPY    . ESOPHAGOGASTRODUODENOSCOPY    . ESOPHAGOGASTRODUODENOSCOPY (EGD) WITH PROPOFOL N/A 04/03/2017   Procedure: ESOPHAGOGASTRODUODENOSCOPY (EGD) WITH PROPOFOL;  Surgeon: Manya Silvas, MD;  Location: [facility] ENDOSCOPY;  Service: Endoscopy;  Laterality: N/A;  . HERNIA REPAIR    . POLYPECTOMY    . ROTATOR CUFF REPAIR Left   . thumb surgery    . TONSILLECTOMY    . TRIGGER FINGER RELEASE Left     There were no vitals filed for this visit.    Pt. reports she has been sleeping much better since Sunday night (pt. taking 2 Gabapentins before bedtime).     There.ex:  Scifit L6 10 min. B UE/LE (warm-up/no charge).   Hooklying figure 4 piriformis stretch with opposing hip flexion for 30 sec hold x2 Supine core bracing: SLR/ bridging/ trunk rotn./ hip abd. With manual  resistance/ bicycle kicks 20x. Prone hip extension 5x.   Discussed HEP/ Silver Sneakers ex. Program at [facility]  Manual tx.:  Supine IT band stretch for 30 sec hold B Supine hamstring/ piriformis/ trunk rotn. Stretches 3x each. Prone STM to low thoracic/lumbar paraspinals/ mob. Reassessment.        Pt. has worked hard with skilled PT services and will benefit from referral to [employer] based ex. program on an independent basis. Pt. has been sleeping better but continues to report chroinc h/o generalized back pain. Pt. understands core progression HEP/ LE stretching ex. program. Discharge from PT at this time.    PT Long Term Goals - 03/03/18 1024      PT LONG TERM GOAL #1   Title  Pt. will decrease resting pain from 3/10 to 1/10 for 2 weeks to show decrease in pain to improve sleep.    Baseline  fluctuating pain depending on activity level.  Pt. sleeping better recently with change in Gabapentin.      Time  8    Period  Weeks    Status  Partially Met    Target Date  02/24/18      PT LONG TERM GOAL #2   Title  Pt. will decrease MODI by 10 points to show a MCID of perceived function to imporve quality of life.    Baseline  1/23: 32%.  3/20: 20%    Time  8    Period  Weeks    Status  Achieved    Target Date  02/24/18      PT LONG TERM GOAL #3   Title  Pt. will increase gross hip strength to 5/5 MMT so patient feels safe and confident to return to independent exercise.    Baseline  B hip flexion 4+/5 MMT    Time  8    Period  Weeks    Status  Partially Met    Target Date  02/24/18      PT LONG TERM GOAL #4   Title  Pt. will decrease worse pain from 5/10 to 3/10 with weighted activity, so pt. can perform "heavy house hold chores" with less pain.    Time  8    Period  Weeks    Status  Partially Met    Target Date  02/24/18         Patient will benefit from skilled therapeutic intervention in order to improve the following deficits and impairments:  Improper  body mechanics, Pain, Decreased mobility, Hypermobility, Postural dysfunction, Decreased activity tolerance, Decreased range of motion, Decreased strength, Hypomobility, Impaired perceived functional ability, Impaired flexibility  Visit Diagnosis: Chronic bilateral low back pain without sciatica  Muscle weakness (generalized)     Problem List Patient Active Problem List   Diagnosis Date Noted  . Acid reflux 02/23/2015  . Anxiety and depression 06/26/2014  . CAFL (chronic airflow limitation) ([place]) 06/26/2014  . DDD (degenerative disc disease), cervical 06/26/2014  . Goiter, nontoxic, multinodular 06/26/2014  . Adult hypothyroidism 06/26/2014  . Arthritis, degenerative 06/26/2014  . Chest pain 01/17/2014  . Tobacco use   . Essential hypertension, benign   . Hyperlipidemia    Pura Spice, PT, DPT # [phone] 03/03/2018, 10:36 AM  [facility] [address] [place], [place], [address] Phone: [phone]   Fax:  [phone]  Name: Janet Osborne MRN: [id number] Date of Birth: 10-11-1951

## 2018-03-24 ENCOUNTER — Ambulatory Visit: Payer: BLUE CROSS/BLUE SHIELD | Admitting: Occupational Therapy

## 2018-06-03 ENCOUNTER — Other Ambulatory Visit: Payer: Self-pay | Admitting: Internal Medicine

## 2018-06-03 DIAGNOSIS — Z1231 Encounter for screening mammogram for malignant neoplasm of breast: Secondary | ICD-10-CM

## 2018-06-30 ENCOUNTER — Ambulatory Visit
Admission: RE | Admit: 2018-06-30 | Discharge: 2018-06-30 | Disposition: A | Discharge status: Home / Self Care | Payer: BLUE CROSS/BLUE SHIELD | Source: Ambulatory Visit | Attending: Internal Medicine | Admitting: Internal Medicine

## 2018-06-30 DIAGNOSIS — Z1231 Encounter for screening mammogram for malignant neoplasm of breast: Secondary | ICD-10-CM | POA: Diagnosis present

## 2018-08-27 ENCOUNTER — Emergency Department
Admission: EM | Admit: 2018-08-27 | Discharge: 2018-08-27 | Disposition: A | Discharge status: Home / Self Care | Payer: BLUE CROSS/BLUE SHIELD | Attending: Student in an Organized Health Care Education/Training Program | Admitting: Student in an Organized Health Care Education/Training Program

## 2018-08-27 ENCOUNTER — Encounter: Payer: Self-pay | Admitting: Emergency Medicine

## 2018-08-27 ENCOUNTER — Emergency Department: Payer: BLUE CROSS/BLUE SHIELD

## 2018-08-27 ENCOUNTER — Other Ambulatory Visit: Payer: Self-pay

## 2018-08-27 DIAGNOSIS — Z79899 Other long term (current) drug therapy: Secondary | ICD-10-CM | POA: Diagnosis not present

## 2018-08-27 DIAGNOSIS — I10 Essential (primary) hypertension: Secondary | ICD-10-CM | POA: Diagnosis not present

## 2018-08-27 DIAGNOSIS — Z87891 Personal history of nicotine dependence: Secondary | ICD-10-CM | POA: Insufficient documentation

## 2018-08-27 DIAGNOSIS — R0789 Other chest pain: Secondary | ICD-10-CM | POA: Insufficient documentation

## 2018-08-27 DIAGNOSIS — R0602 Shortness of breath: Secondary | ICD-10-CM | POA: Insufficient documentation

## 2018-08-27 LAB — CBC
HEMATOCRIT: 42 % (ref 35.0–47.0)
HEMOGLOBIN: 14.7 g/dL (ref 12.0–16.0)
MCH: 32 pg (ref 26.0–34.0)
MCHC: 35 g/dL (ref 32.0–36.0)
MCV: 91.3 fL (ref 80.0–100.0)
Platelets: 269 10*3/uL (ref 150–440)
RBC: 4.6 MIL/uL (ref 3.80–5.20)
RDW: 12.6 % (ref 11.5–14.5)
WBC: 7.8 10*3/uL (ref 3.6–11.0)

## 2018-08-27 LAB — TROPONIN I: Troponin I: <0.03 ng/mL (ref <0.03)

## 2018-08-27 LAB — BASIC METABOLIC PANEL
ANION GAP: 10 (ref 5–15)
BUN: 15 mg/dL (ref 8–23)
CHLORIDE: 105 mmol/L (ref 98–111)
CO2: 24 mmol/L (ref 22–32)
Calcium: 9.7 mg/dL (ref 8.9–10.3)
Creatinine, Ser: 0.65 mg/dL (ref 0.44–1.00)
Glucose, Bld: 117 mg/dL — ABNORMAL HIGH (ref 70–99)
POTASSIUM: 3.9 mmol/L (ref 3.5–5.1)
SODIUM: 139 mmol/L (ref 135–145)

## 2018-08-27 MED ORDER — IPRATROPIUM-ALBUTEROL 0.5-2.5 (3) MG/3ML IN SOLN
3.0000 mL | Freq: Once | RESPIRATORY_TRACT | Status: AC
  Administered 2018-08-27: 3 mL via RESPIRATORY_TRACT
  Filled 2018-08-27: qty 3

## 2018-08-27 MED ORDER — OPTICHAMBER DIAMOND MISC
Status: AC
  Filled 2018-08-27: qty 1

## 2018-08-27 MED ORDER — AEROCHAMBER PLUS FLO-VU MISC
1.0000 | Freq: Once | Status: AC
  Administered 2018-08-27: 1

## 2018-08-27 MED ORDER — PEDIATRIC MEDIUM MASK MISC
Status: AC
  Filled 2018-08-27: qty 1

## 2018-08-27 MED ORDER — PREDNISONE 20 MG PO TABS: 40.0000 mg | ORAL_TABLET | Freq: Every day | ORAL | 0 refills | Status: AC

## 2018-08-27 NOTE — ED Triage Notes (Signed)
C/O chest tightness for the past few weeks.  Patient describes discomfort as "something is stuck in there".  Patient is a former smoker and states she does have emphysema, but has been asymptomatic.  AAOx3.  Skin warm and dry. NAD

## 2018-08-27 NOTE — ED Notes (Signed)
MD at bedside to assess pt. VSS.

## 2018-08-27 NOTE — ED Provider Notes (Signed)
[facility] Emergency Department Provider Note    First MD Initiated Contact with Patient 08/27/18 1300     (approximate)  I have reviewed the triage vital signs and the nursing notes.   HISTORY  Chief Complaint Chest Pain    HPI Jasilyn Holderman is a 67 y.o. female presents the ER for evaluation of chest tightness  and some shortness of breath past several days.  Patient was recently diagnosed with bronchitis and told that she may have emphysema and was given referral to pulmonology clinic.  Denies any chest pain or pressure.  States that she has difficulty getting breath out of her lungs.  Denies any productive cough.  No recent fevers.  No nausea or vomiting.  No lower extremity swelling.   Past Medical History:  Diagnosis Date  . Abdominal hernia   . Anemia   . Anxiety   . Chronic constipation   . Colon polyps   . DDD (degenerative disc disease), cervical   . Depression   . Endometriosis   . Essential hypertension, benign   . Fibrocystic breast disease   . GERD (gastroesophageal reflux disease)   . H/O hand surgery 03/17/2017  . Hemorrhoids   . Hyperlipidemia   . Migraine   . Nontoxic multinodular goiter   . Osteoarthrosis involving, or with mention of more than one site, but not specified as generalized, multiple sites   . Tobacco use    Family History  Problem Relation Age of Onset  . Hypertension Mother   . Clotting disorder Mother   . Diabetes Mother   . Dementia Mother   . Osteoarthritis Mother   . Gallbladder disease Mother   . Hypertension Father   . Prostate cancer Father   . COPD Father   . Osteoarthritis Father   . Gallbladder disease Father   . Colon polyps Father   . Osteoarthritis Sister   . Thyroid disease Sister   . HIV Brother   . Breast cancer Neg Hx    Past Surgical History:  Procedure Laterality Date  . ABDOMINAL HYSTERECTOMY    . ANTERIOR CERVICAL DECOMP/DISCECTOMY FUSION    . APPENDECTOMY    .  CARPAL TUNNEL RELEASE Bilateral   . COLONOSCOPY    . ESOPHAGOGASTRODUODENOSCOPY    . ESOPHAGOGASTRODUODENOSCOPY (EGD) WITH PROPOFOL N/A 04/03/2017   Procedure: ESOPHAGOGASTRODUODENOSCOPY (EGD) WITH PROPOFOL;  Surgeon: Manya Silvas, MD;  Location: [facility] ENDOSCOPY;  Service: Endoscopy;  Laterality: N/A;  . HERNIA REPAIR    . POLYPECTOMY    . ROTATOR CUFF REPAIR Left   . thumb surgery    . TONSILLECTOMY    . TRIGGER FINGER RELEASE Left    Patient Active Problem List   Diagnosis Date Noted  . Acid reflux 02/23/2015  . Anxiety and depression 06/26/2014  . CAFL (chronic airflow limitation) ([place]) 06/26/2014  . DDD (degenerative disc disease), cervical 06/26/2014  . Goiter, nontoxic, multinodular 06/26/2014  . Adult hypothyroidism 06/26/2014  . Arthritis, degenerative 06/26/2014  . Chest pain 01/17/2014  . Tobacco use   . Essential hypertension, benign   . Hyperlipidemia       Prior to Admission medications   Medication Sig Start Date End Date Taking? Authorizing Provider  acetaminophen (TYLENOL) 325 MG tablet Take 650 mg by mouth every 6 (six) hours as needed.    [provider]  Calcium Carbonate (CALCIUM 600 PO) Take 2 tablets by mouth daily.    [provider]  Coenzyme Q10 (CO Q-10) 200  MG CAPS Take by mouth daily.    [provider]  diazepam (VALIUM) 5 MG tablet Take 5 mg by mouth every 6 (six) hours as needed for anxiety.    [provider]  Docusate Calcium (STOOL SOFTENER PO) Take by mouth 2 (two) times daily.    [provider]  DULoxetine (CYMBALTA) 30 MG capsule Take 30 mg by mouth daily.    [provider]  estrogens, conjugated, (PREMARIN) 0.3 MG tablet TAKE 1 TABLET BY MOUTH EVERY DAY 05/07/15   [provider]  gabapentin (NEURONTIN) 300 MG capsule Take 300 mg by mouth at bedtime.  12/26/13   [provider]  guaiFENesin-codeine (ROBITUSSIN AC) 100-10 MG/5ML syrup Take by mouth. 12/12/14   [provider]  hydrochlorothiazide (HYDRODIURIL) 25 MG tablet Take by mouth. 02/27/15 04/03/17  [provider]  levothyroxine (SYNTHROID, LEVOTHROID) 75 MCG tablet Take 75 mcg by mouth daily before breakfast.    [provider]  losartan (COZAAR) 50 MG tablet Take 50 mg by mouth daily.    [provider]  Melatonin 10 MG CAPS Take by mouth daily.    [provider]  methocarbamol (ROBAXIN) 500 MG tablet Take 500 mg by mouth 4 (four) times daily.    [provider]  Multiple Vitamins-Minerals (EYE VITAMINS PO) Take by mouth daily.    [provider]  NEXIUM 40 MG capsule Take 40 mg by mouth daily.     [provider]  pravastatin (PRAVACHOL) 40 MG tablet Take 40 mg by mouth as needed.     [provider]  predniSONE (DELTASONE) 20 MG tablet Take 2 tablets (40 mg total) by mouth daily for 5 days. 08/27/18 09/01/18  Merlyn Lot, MD  SUMAtriptan (IMITREX) 100 MG tablet Take 100 mg by mouth as needed.  11/09/13   [provider]  traMADol (ULTRAM) 50 MG tablet Take by mouth every 6 (six) hours as needed.    [provider]    Allergies Macrodantin [nitrofurantoin macrocrystal] and Penicillin v potassium    Social History Social History   Tobacco Use  . Smoking status: Former Smoker    Packs/day: 0.25    Years: 40.00    Pack years: 10.00    Types: Cigarettes    Last attempt to quit: 01/08/2017    Years since quitting: 1.6  . Smokeless tobacco: Never Used  . Tobacco comment: Uses nicotine patch  Substance Use Topics  . Alcohol use: Yes    Comment: occassional  . Drug use: No    Review of Systems Patient denies headaches, rhinorrhea, blurry vision, numbness, shortness of breath, chest pain, edema, cough, abdominal pain, nausea, vomiting, diarrhea, dysuria, fevers, rashes or hallucinations unless otherwise stated above in HPI. ____________________________________________   PHYSICAL  EXAM:  VITAL SIGNS: Vitals:   08/27/18 0922 08/27/18 1317  BP: (!) 152/78   Pulse: 76   Resp: 16   Temp: 98.1 F (36.7 C)   SpO2: 95% 97%    Constitutional: Alert and oriented.  Eyes: Conjunctivae are normal.  Head: Atraumatic. Nose: No congestion/rhinnorhea. Mouth/Throat: Mucous membranes are moist.   Neck: No stridor. Painless ROM.  Cardiovascular: Normal rate, regular rhythm. Grossly normal heart sounds.  Good peripheral circulation. Respiratory: Normal respiratory effort.  No retractions. Lungs with scattered faint wheeze and prolonged expiratory phase. Gastrointestinal: Soft and nontender. No distention. No abdominal bruits. No CVA tenderness. Genitourinary:  Musculoskeletal: No lower extremity tenderness nor edema.  No joint effusions. Neurologic:  Normal  speech and language. No gross focal neurologic deficits are appreciated. No facial droop Skin:  Skin is warm, dry and intact. No rash noted. Psychiatric: Mood and affect are normal. Speech and behavior are normal.  ____________________________________________   LABS (all labs ordered are listed, but only abnormal results are displayed)  Results for orders placed or performed during the hospital encounter of 08/27/18 (from the past 24 hour(s))  Basic metabolic panel     Status: Abnormal   Collection Time: 08/27/18  9:43 AM  Result Value Ref Range   Sodium 139 135 - 145 mmol/L   Potassium 3.9 3.5 - 5.1 mmol/L   Chloride 105 98 - 111 mmol/L   CO2 24 22 - 32 mmol/L   Glucose, Bld 117 (H) 70 - 99 mg/dL   BUN 15 8 - 23 mg/dL   Creatinine, Ser 0.65 0.44 - 1.00 mg/dL   Calcium 9.7 8.9 - 10.3 mg/dL   GFR calc non Af Amer >60 >60 mL/min   GFR calc Af Amer >60 >60 mL/min   Anion gap 10 5 - 15  CBC     Status: None   Collection Time: 08/27/18  9:43 AM  Result Value Ref Range   WBC 7.8 3.6 - 11.0 K/uL   RBC 4.60 3.80 - 5.20 MIL/uL   Hemoglobin 14.7 12.0 - 16.0 g/dL   HCT 42.0 35.0 - 47.0 %   MCV 91.3 80.0 - 100.0 fL    MCH 32.0 26.0 - 34.0 pg   MCHC 35.0 32.0 - 36.0 g/dL   RDW 12.6 11.5 - 14.5 %   Platelets 269 150 - 440 K/uL  Troponin I     Status: None   Collection Time: 08/27/18  9:43 AM  Result Value Ref Range   Troponin I <0.03 <0.03 ng/mL   ____________________________________________  EKG My review and personal interpretation at Time: 9:13   Indication: sob  Rate: 70  Rhythm: sinus Axis: normal Other: normal intervals, no stemi ____________________________________________  RADIOLOGY  I personally reviewed all radiographic images ordered to evaluate for the above acute complaints and reviewed radiology reports and findings.  These findings were personally discussed with the patient.  Please see medical record for radiology report.  ____________________________________________   PROCEDURES  Procedure(s) performed:  Procedures    Critical Care performed: no ____________________________________________   INITIAL IMPRESSION / ASSESSMENT AND PLAN / ED COURSE  Pertinent labs & imaging results that were available during my care of the patient were reviewed by me and considered in my medical decision making (see chart for details).   DDX: Asthma, copd, CHF, pna, ptx, malignancy,  anemia   Talaya Lamprecht is a 67 y.o. who presents to the ED with symptoms as described above  Patient is AFVSS in ED. Exam as above. Given current presentation have considered the above differential.  The patient will be placed on continuous pulse oximetry and telemetry for monitoring.  Laboratory evaluation will be sent to evaluate for the above complaints.   CXR ordered to evaluate for the above differential is normal.  Does not have any evidence of consolidation or pneumothorax.  Based on her wheezing clinical exam will try out nebulizer treatment.  Do expect some component of worsening obstructive lung pathology.  Not clinically consistent with PE infectious process.  No evidence of ACS.  Clinical  Course as of Aug 27 1418  Fri Aug 27, 2018  1413 Patient reassessed after nebulizer treatment.  No longer having any wheeze.  Does not have  any pain at this time.  No hypoxia.  Will give another burst of steroids.  She has follow-up with pulmonology on Tuesday.  At this point do believe she stable and appropriate for outpatient follow-up.   [PR]    Clinical Course User Index [PR] Merlyn Lot, MD     As part of my medical decision making, I reviewed the following data within the [place] notes reviewed and incorporated, Labs reviewed, notes from prior ED visits.   ____________________________________________   FINAL CLINICAL IMPRESSION(S) / ED DIAGNOSES  Final diagnoses:  Chest tightness      NEW MEDICATIONS STARTED DURING THIS VISIT:  New Prescriptions   PREDNISONE (DELTASONE) 20 MG TABLET    Take 2 tablets (40 mg total) by mouth daily for 5 days.     Note:  This document was prepared using Dragon voice recognition software and may include unintentional dictation errors.    Merlyn Lot, MD 08/27/18 [phone]

## 2018-08-27 NOTE — Discharge Instructions (Addendum)
Please take your albuterol with the inhaler spacer that we are providing you here in the ER.  Please take this medication every 4-6 hours while awake for the next 3 to 4 days.  Return to the ER if you develop any worsening symptoms including worsening chest pain, shortness of breath, fevers or cough.

## 2018-11-30 IMAGING — CT CT ABD-PELV W/ CM
1 of 3 series · 13 of 32 positions shown, 18 images · IV contrast (APPLIED)
Comparison: CT scan 11/17/10

CLINICAL DATA: Left lower quadrant pain and pelvic pain starting
December 2016, no known injury, history of left inguinal incarcerated
hernia repair in 5425

EXAM:
CT ABDOMEN AND PELVIS WITH CONTRAST
TECHNIQUE: Multidetector CT imaging of the abdomen and pelvis was performed
using the standard protocol following bolus administration of
intravenous contrast.
CONTRAST:  100mL 4OUNHX-GYY IOPAMIDOL (4OUNHX-GYY) INJECTION 61%

[Series 2: axial st · axial · 0.67mm/px · z∈[-1050,-650]mm · 13 of 90 slices shown, 18 images]
[im 5/90  soft-tissue]
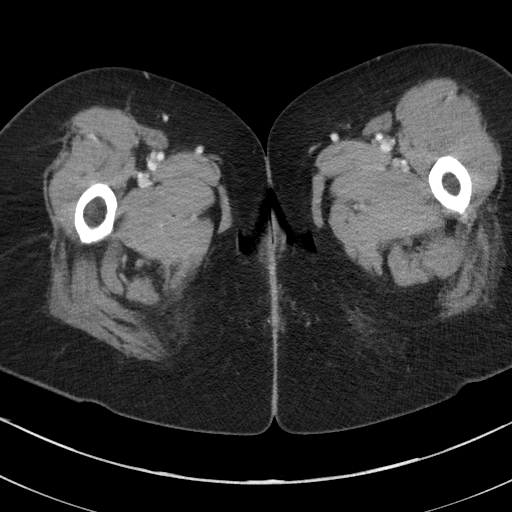
[im 5/90  bone]
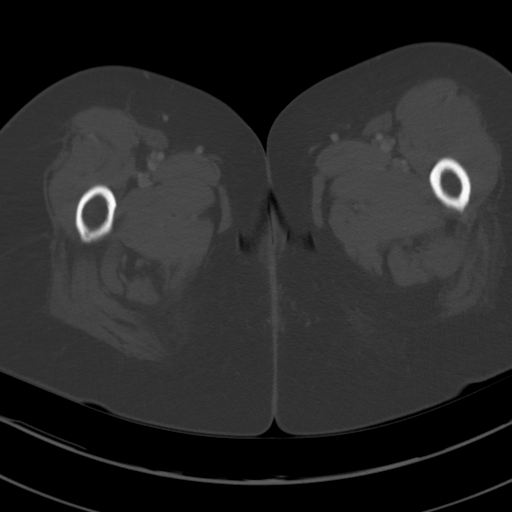
[im 15/90  soft-tissue]
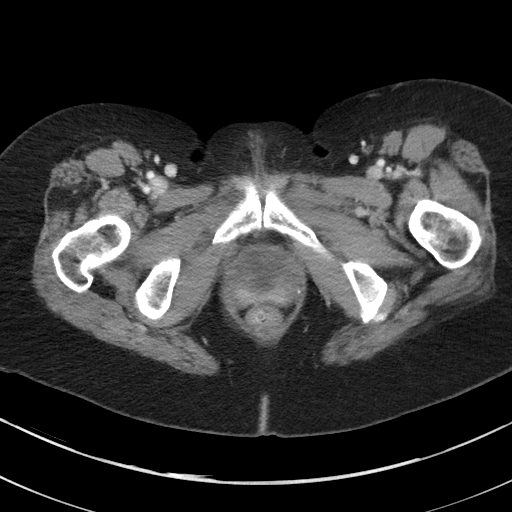
[im 20/90  soft-tissue]
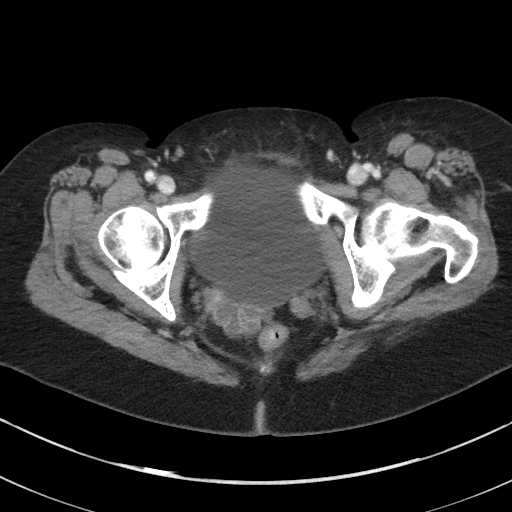
[im 25/90  soft-tissue]
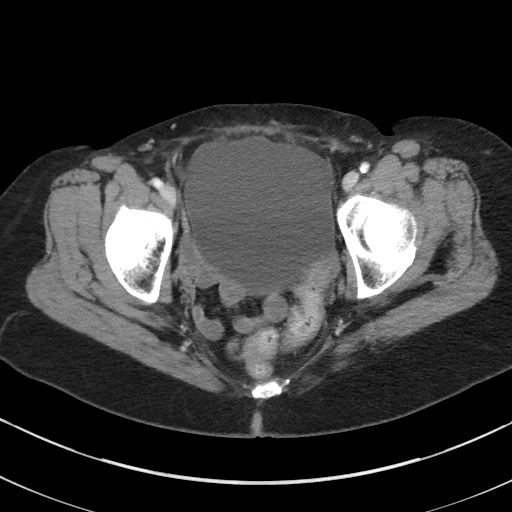
[im 35/90  soft-tissue]
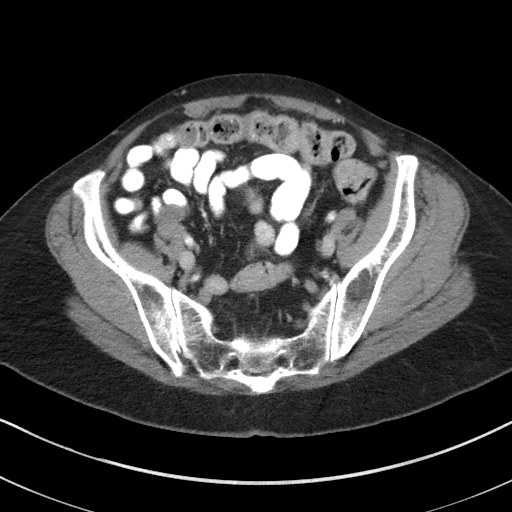
[im 40/90  soft-tissue]
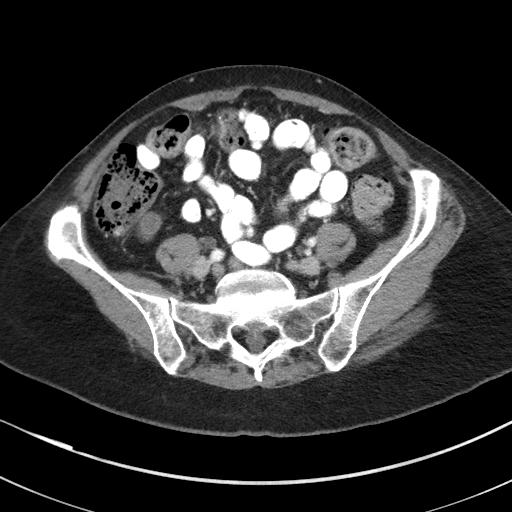
[im 50/90  soft-tissue]
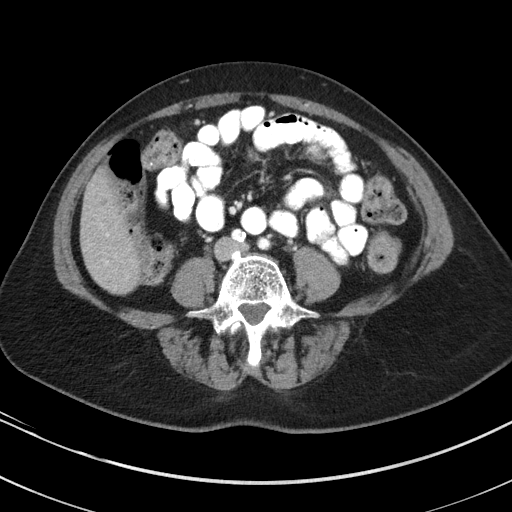
[im 55/90  soft-tissue]
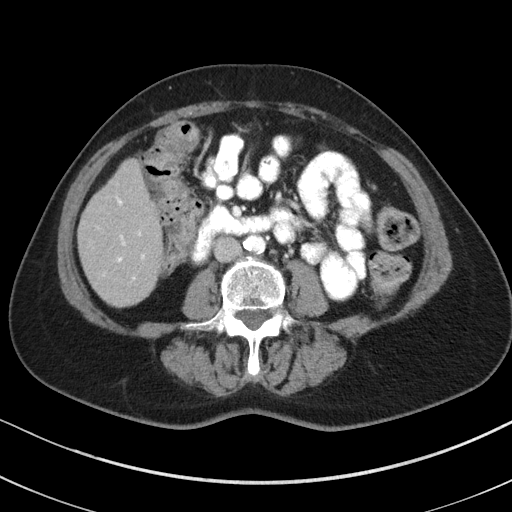
[im 65/90  soft-tissue]
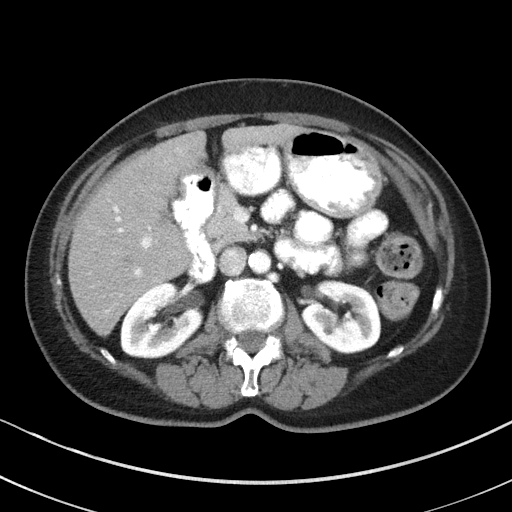
[im 65/90  bone]
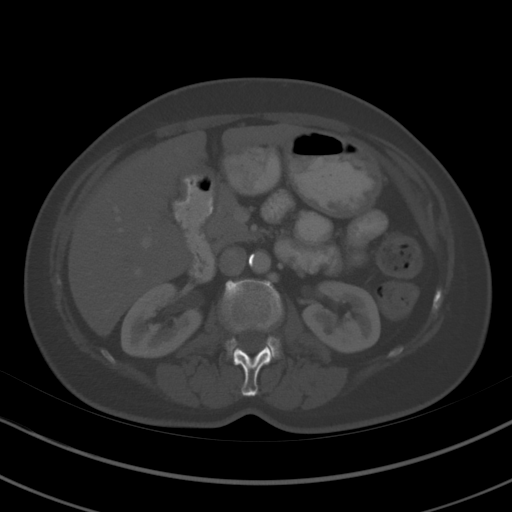
[im 70/90  soft-tissue]
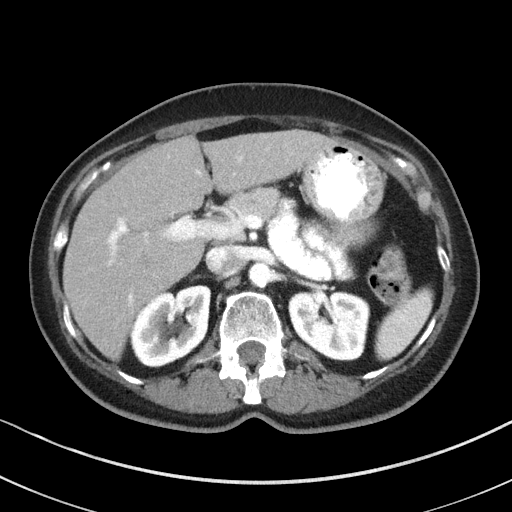
[im 70/90  lung]
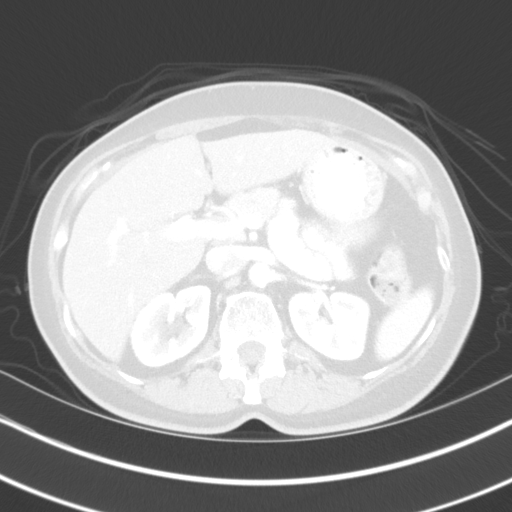
[im 75/90  soft-tissue]
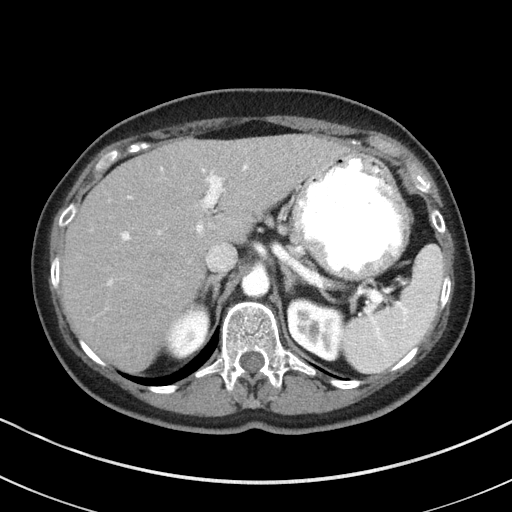
[im 75/90  lung]
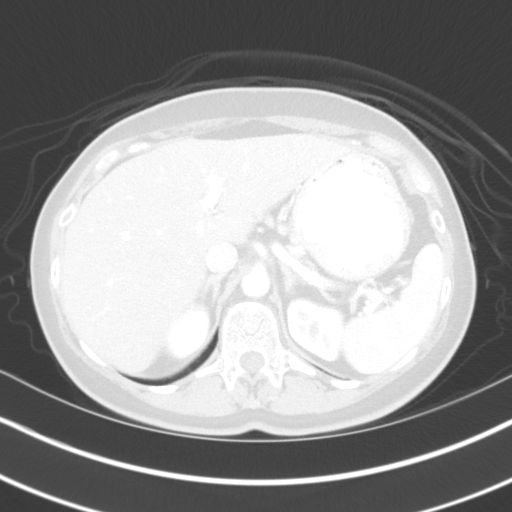
[im 80/90  lung]
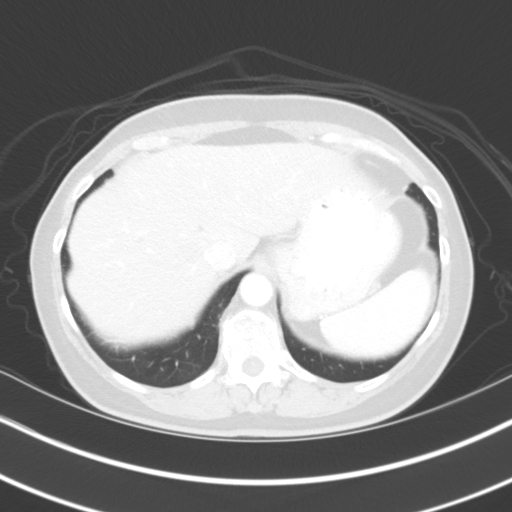
[im 85/90  soft-tissue]
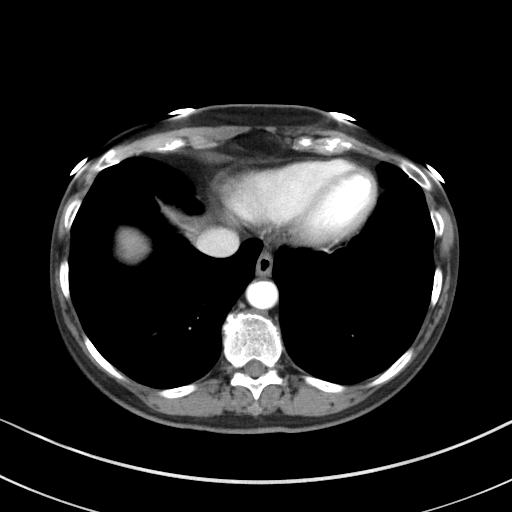
[im 85/90  lung]
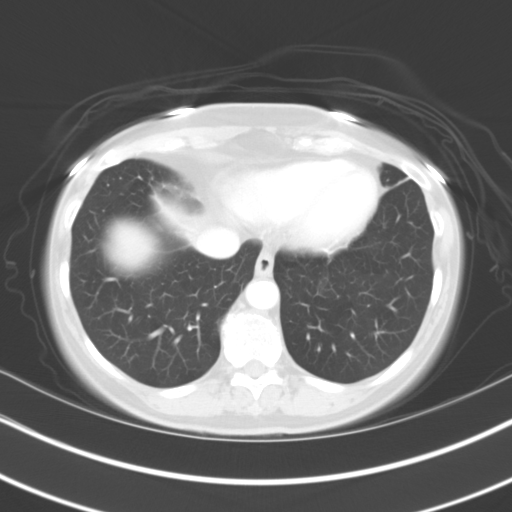

[13 of 32 positions shown; findings below may reference images not displayed]

FINDINGS: Lower chest: No acute findings

Hepatobiliary: There is mild fatty infiltration of the liver. There
is a cyst in lateral segment of the left hepatic lobe measures
cm. No calcified gallstones are noted within gallbladder.

Pancreas: Unremarkable. No pancreatic ductal dilatation or
surrounding inflammatory changes.

Spleen: Normal in size without focal abnormality.

Adrenals/Urinary Tract: No adrenal gland mass. Enhanced kidneys are
symmetrical in size. No hydronephrosis or hydroureter. Delayed renal
images shows bilateral renal symmetrical excretion. Bilateral
visualized proximal ureter is unremarkable. The urinary bladder is
unremarkable.

Stomach/Bowel: Oral contrast material was given to the patient. No
gastric outlet obstruction. No small bowel obstruction. No pericecal
inflammation. The patient is status post appendectomy. The terminal
ileum is unremarkable. Moderate stool noted in right colon
transverse colon and descending colon. Moderate stool noted in
proximal sigmoid colon. There is a redundant distal sigmoid colon.
No distal colitis or diverticulitis.

Vascular/Lymphatic: No aortic aneurysm. Atherosclerotic
calcifications of abdominal aorta and iliac arteries. No
retroperitoneal or mesenteric adenopathy.

Reproductive: The patient is status post hysterectomy.

Other: No ascites or free abdominal air. No evidence of recurrent
inguinal hernia. No inguinal adenopathy.

Musculoskeletal: No destructive bony lesions are noted. Sagittal
images of the spine shows disc space flattening with vacuum disc
phenomenon mild anterior and mild posterior spurring at L4-L5 level.
Bilateral pars defect at L5 level. Mild facet degenerative changes
L4 level.
IMPRESSION: 1. There is no evidence of acute inflammatory process within
abdomen.
2. Mild fatty infiltration of the liver.
3. No hydronephrosis or hydroureter.
4. No pericecal inflammation.  Status post appendectomy.
5. Moderate stool throughout the colon suspicious for mild
constipation.
6. No evidence of distal colitis or diverticulitis. No distal
colonic obstruction. Mild redundant distal sigmoid colon.
7. No evidence of recurrent inguinal hernia. No evidence of inguinal
adenopathy.
8. Degenerative changes lumbar spine as described above.

## 2019-03-29 DIAGNOSIS — M205X1 Other deformities of toe(s) (acquired), right foot: Secondary | ICD-10-CM | POA: Diagnosis not present

## 2019-04-04 ENCOUNTER — Ambulatory Visit: Payer: Self-pay | Admitting: *Deleted

## 2019-04-06 ENCOUNTER — Other Ambulatory Visit: Payer: Self-pay | Admitting: *Deleted

## 2019-04-06 DIAGNOSIS — E039 Hypothyroidism, unspecified: Secondary | ICD-10-CM | POA: Diagnosis not present

## 2019-04-06 DIAGNOSIS — E782 Mixed hyperlipidemia: Secondary | ICD-10-CM | POA: Diagnosis not present

## 2019-04-06 DIAGNOSIS — Z79899 Other long term (current) drug therapy: Secondary | ICD-10-CM | POA: Diagnosis not present

## 2019-04-06 DIAGNOSIS — I10 Essential (primary) hypertension: Secondary | ICD-10-CM | POA: Diagnosis not present

## 2019-04-06 DIAGNOSIS — R739 Hyperglycemia, unspecified: Secondary | ICD-10-CM | POA: Diagnosis not present

## 2019-04-06 NOTE — Patient Outreach (Signed)
[place] [facility]) Care Management  04/06/2019  Chen Holzman [facility] 16-May-1951 [id number]   RN Health coach sent a welcome letter. As a [employer] health plan.  Plan: Next follow up outreach within the month of May.  [place] Care Management [phone]

## 2019-04-13 ENCOUNTER — Other Ambulatory Visit: Payer: Self-pay | Admitting: Internal Medicine

## 2019-04-13 DIAGNOSIS — E039 Hypothyroidism, unspecified: Secondary | ICD-10-CM | POA: Diagnosis not present

## 2019-04-13 DIAGNOSIS — J449 Chronic obstructive pulmonary disease, unspecified: Secondary | ICD-10-CM | POA: Diagnosis not present

## 2019-04-13 DIAGNOSIS — F329 Major depressive disorder, single episode, unspecified: Secondary | ICD-10-CM | POA: Diagnosis not present

## 2019-04-13 DIAGNOSIS — J431 Panlobular emphysema: Secondary | ICD-10-CM | POA: Diagnosis not present

## 2019-04-13 DIAGNOSIS — M7989 Other specified soft tissue disorders: Principal | ICD-10-CM

## 2019-04-13 DIAGNOSIS — Z79899 Other long term (current) drug therapy: Secondary | ICD-10-CM | POA: Diagnosis not present

## 2019-04-13 DIAGNOSIS — Z0000 Encounter for general adult medical examination without abnormal findings: Secondary | ICD-10-CM | POA: Diagnosis not present

## 2019-04-13 DIAGNOSIS — F419 Anxiety disorder, unspecified: Secondary | ICD-10-CM | POA: Diagnosis not present

## 2019-04-13 DIAGNOSIS — R739 Hyperglycemia, unspecified: Secondary | ICD-10-CM | POA: Diagnosis not present

## 2019-04-13 DIAGNOSIS — E782 Mixed hyperlipidemia: Secondary | ICD-10-CM | POA: Diagnosis not present

## 2019-04-13 DIAGNOSIS — R0600 Dyspnea, unspecified: Secondary | ICD-10-CM | POA: Diagnosis not present

## 2019-04-13 DIAGNOSIS — I10 Essential (primary) hypertension: Secondary | ICD-10-CM | POA: Diagnosis not present

## 2019-04-13 DIAGNOSIS — Z1239 Encounter for other screening for malignant neoplasm of breast: Secondary | ICD-10-CM | POA: Diagnosis not present

## 2019-04-13 DIAGNOSIS — M79604 Pain in right leg: Secondary | ICD-10-CM | POA: Diagnosis not present

## 2019-04-13 DIAGNOSIS — M79661 Pain in right lower leg: Secondary | ICD-10-CM

## 2019-04-13 DIAGNOSIS — Z1231 Encounter for screening mammogram for malignant neoplasm of breast: Secondary | ICD-10-CM

## 2019-04-20 ENCOUNTER — Ambulatory Visit
Admission: RE | Admit: 2019-04-20 | Discharge: 2019-04-20 | Disposition: A | Discharge status: Home / Self Care | Payer: PPO | Source: Ambulatory Visit | Attending: Internal Medicine | Admitting: Internal Medicine

## 2019-04-20 ENCOUNTER — Other Ambulatory Visit: Payer: Self-pay

## 2019-04-20 DIAGNOSIS — M7989 Other specified soft tissue disorders: Secondary | ICD-10-CM | POA: Insufficient documentation

## 2019-04-20 DIAGNOSIS — M79661 Pain in right lower leg: Secondary | ICD-10-CM | POA: Insufficient documentation

## 2019-04-25 ENCOUNTER — Ambulatory Visit: Payer: Self-pay | Admitting: *Deleted

## 2019-04-27 ENCOUNTER — Ambulatory Visit: Payer: Self-pay | Admitting: *Deleted

## 2019-05-06 ENCOUNTER — Other Ambulatory Visit: Payer: Self-pay | Admitting: *Deleted

## 2019-05-06 NOTE — Patient Outreach (Signed)
[name] [facility]) Care Management  05/06/2019  Janet Osborne [facility] [birth year] [phone]   RN Health Coach attempted #! follow up outreach call to patient.  Patient was unavailable. HIPPA compliance voicemail message left with return callback number.  Plan: RN will call patient again within 30 days.  [place] Care Management [phone]

## 2019-05-09 ENCOUNTER — Other Ambulatory Visit: Payer: Self-pay | Admitting: *Deleted

## 2019-05-09 NOTE — Patient Outreach (Signed)
[place] [facility]) Care Management  05/09/2019  Aaylah Pokorny [facility] 1950-12-21 [id number]   RN Health Coach returned  telephone call to patient.  Patient called after hours. Hipaa compliance voicemail left with return call back number.  [place] Care Management [phone]

## 2019-05-11 ENCOUNTER — Other Ambulatory Visit: Payer: Self-pay | Admitting: *Deleted

## 2019-05-11 DIAGNOSIS — M205X1 Other deformities of toe(s) (acquired), right foot: Secondary | ICD-10-CM | POA: Diagnosis not present

## 2019-05-11 NOTE — Patient Outreach (Signed)
[place] [facility]) Care Management  05/11/2019   Clair Bardwell [facility] 02-17-1951 [id number]  RN Health Coach received a return telephone call from patient.  Hipaa compliance verified. Per patient she is doing good. Patient has a symbicort inhaler she is using a prescribed. Patient had PFT test  2019. Per patient she is taking all medications as prescribed. Patient is using safety coronavirus precautions wearing a mask and washing hands. Patient has agreed to follow up outreach falls.    Current Medications:  Current Outpatient Medications  Medication Sig Dispense Refill  . acetaminophen (TYLENOL) 325 MG tablet Take 650 mg by mouth every 6 (six) hours as needed.    . Calcium Carbonate (CALCIUM 600 PO) Take 2 tablets by mouth daily.    . Coenzyme Q10 (CO Q-10) 200 MG CAPS Take by mouth daily.    . diazepam (VALIUM) 5 MG tablet Take 5 mg by mouth every 6 (six) hours as needed for anxiety.    Mariane Baumgarten Calcium (STOOL SOFTENER PO) Take by mouth 2 (two) times daily.    . DULoxetine (CYMBALTA) 30 MG capsule Take 30 mg by mouth daily.    Marland Kitchen estrogens, conjugated, (PREMARIN) 0.3 MG tablet TAKE 1 TABLET BY MOUTH EVERY DAY    . gabapentin (NEURONTIN) 300 MG capsule Take 300 mg by mouth at bedtime.     Marland Kitchen guaiFENesin-codeine (ROBITUSSIN AC) 100-10 MG/5ML syrup Take by mouth.    . hydrochlorothiazide (HYDRODIURIL) 25 MG tablet Take by mouth.    . levothyroxine (SYNTHROID, LEVOTHROID) 75 MCG tablet Take 75 mcg by mouth daily before breakfast.    . losartan (COZAAR) 50 MG tablet Take 50 mg by mouth daily.    . Melatonin 10 MG CAPS Take by mouth daily.    . methocarbamol (ROBAXIN) 500 MG tablet Take 500 mg by mouth 4 (four) times daily.    . Multiple Vitamins-Minerals (EYE VITAMINS PO) Take by mouth daily.    Marland Kitchen NEXIUM 40 MG capsule Take 40 mg by mouth daily.     . pravastatin (PRAVACHOL) 40 MG tablet Take 40 mg by mouth as needed.     . SUMAtriptan (IMITREX) 100 MG tablet Take 100 mg by mouth  as needed.     . traMADol (ULTRAM) 50 MG tablet Take by mouth every 6 (six) hours as needed.     No current facility-administered medications for this visit.     Functional Status:  In your present state of health, do you have any difficulty performing the following activities: 05/11/2019  Hearing? N  Vision? N  Difficulty concentrating or making decisions? N  Walking or climbing stairs? Y  Comment shome shortness of breath on exertion  Dressing or bathing? N  Doing errands, shopping? N  Preparing Food and eating ? N  Using the Toilet? N  In the past six months, have you accidently leaked urine? N  Do you have problems with loss of bowel control? N  Managing your Medications? N  Managing your Finances? N  Housekeeping or managing your Housekeeping? N  Some recent data might be hidden    Fall/Depression Screening: Fall Risk  05/11/2019  Falls in the past year? 0   PHQ 2/9 Scores 05/11/2019  PHQ - 2 Score 0   THN CM Care Plan Problem One     Most Recent Value  Care Plan Problem One  Knowledge deficit in Self Management of COPD  Role Documenting the Problem One  [place] for Problem One  Active  THN Long Term Goal   Patient will not have any admisssions for COPD exacerbation within the next 90 days  THN Long Term Goal Start Date  05/11/19  Interventions for Problem One Long Term Goal  RN discussed medication adherence. RN disucssed using the inhalers correctly. RN sent educational material on COPD exacerbation. RN will follow up with fuether discussion  THN CM Short Term Goal #1   Patient will have a better understanding of COPD and Physical activity within the next 30 days  THN CM Short Term Goal #1 Start Date  05/11/19  Interventions for Short Term Goal #1  RN sent educational material on exercise and  physical activity for COPD patients. RN will follow up with further discussion  THN CM Short Term Goal #2   Patient will have a better understanding of AN eating plan  for COPD   THN CM Short Term Goal #2 Start Date  05/11/19  Interventions for Short Term Goal #2  Patient will understand that small frequent meals are better that large meals for COPD patients. RN sent educational material. RN will follow up with further discussion  THN CM Short Term Goal #3  Patient willl have a better understanding of ways to prevent COPD exacerbations within the next 30 days  THN CM Short Term Goal #3 Start Date  05/11/19  Interventions for Short Tern Goal #3  RN discussed the impotance of using inhalers as directed and using correctly. RN sent educational material on COPD exacerbation . RN sent action form for COPD. RN will follow up with further discussion and teach back      Assessment:  Patient is using inhalers as directed Patient is on low sodium heart healthy diet Patient is using safety precautions during pandemic Patient will benefit from [place] telephonic outreach for education and support for COPD self management.  Plan:  RN discussed Coronavirus safety precautions RN sent Clinical Key on COPD Exacerbation RN sent Clinical Key education on AN eating plan for COPD patients RN sent Clinical key education on COPD and physicia;l activity RN discussed medication adherence RN sent barriers and assessment letter to PCP RN will follow up within the month of August  [place] Management [phone]

## 2019-05-23 ENCOUNTER — Other Ambulatory Visit: Payer: Self-pay | Admitting: Podiatry

## 2019-05-24 ENCOUNTER — Other Ambulatory Visit: Payer: Self-pay

## 2019-05-24 ENCOUNTER — Encounter
Admission: RE | Admit: 2019-05-24 | Discharge: 2019-05-24 | Disposition: A | Discharge status: Home / Self Care | Payer: PPO | Source: Ambulatory Visit | Attending: Podiatry | Admitting: Podiatry

## 2019-05-24 DIAGNOSIS — K219 Gastro-esophageal reflux disease without esophagitis: Secondary | ICD-10-CM | POA: Diagnosis not present

## 2019-05-24 DIAGNOSIS — R001 Bradycardia, unspecified: Secondary | ICD-10-CM | POA: Insufficient documentation

## 2019-05-24 DIAGNOSIS — F418 Other specified anxiety disorders: Secondary | ICD-10-CM | POA: Diagnosis not present

## 2019-05-24 DIAGNOSIS — Z1159 Encounter for screening for other viral diseases: Secondary | ICD-10-CM | POA: Insufficient documentation

## 2019-05-24 DIAGNOSIS — E039 Hypothyroidism, unspecified: Secondary | ICD-10-CM | POA: Diagnosis not present

## 2019-05-24 DIAGNOSIS — M205X1 Other deformities of toe(s) (acquired), right foot: Secondary | ICD-10-CM | POA: Diagnosis not present

## 2019-05-24 DIAGNOSIS — J449 Chronic obstructive pulmonary disease, unspecified: Secondary | ICD-10-CM | POA: Diagnosis not present

## 2019-05-24 DIAGNOSIS — Z7982 Long term (current) use of aspirin: Secondary | ICD-10-CM | POA: Diagnosis not present

## 2019-05-24 DIAGNOSIS — M899 Disorder of bone, unspecified: Secondary | ICD-10-CM | POA: Diagnosis not present

## 2019-05-24 DIAGNOSIS — Z7951 Long term (current) use of inhaled steroids: Secondary | ICD-10-CM | POA: Diagnosis not present

## 2019-05-24 DIAGNOSIS — Z01812 Encounter for preprocedural laboratory examination: Secondary | ICD-10-CM | POA: Diagnosis not present

## 2019-05-24 DIAGNOSIS — Z01818 Encounter for other preprocedural examination: Secondary | ICD-10-CM | POA: Insufficient documentation

## 2019-05-24 DIAGNOSIS — Z79899 Other long term (current) drug therapy: Secondary | ICD-10-CM | POA: Diagnosis not present

## 2019-05-24 DIAGNOSIS — I10 Essential (primary) hypertension: Secondary | ICD-10-CM | POA: Insufficient documentation

## 2019-05-24 DIAGNOSIS — Z79890 Hormone replacement therapy (postmenopausal): Secondary | ICD-10-CM | POA: Diagnosis not present

## 2019-05-24 DIAGNOSIS — Z01810 Encounter for preprocedural cardiovascular examination: Secondary | ICD-10-CM | POA: Diagnosis not present

## 2019-05-24 DIAGNOSIS — M898X9 Other specified disorders of bone, unspecified site: Secondary | ICD-10-CM | POA: Diagnosis not present

## 2019-05-24 HISTORY — DX: Other complications of anesthesia, initial encounter: T88.59XA

## 2019-05-24 HISTORY — DX: Chronic obstructive pulmonary disease, unspecified: J44.9

## 2019-05-24 NOTE — Patient Instructions (Signed)
Your procedure is scheduled on: Friday 05/27/19 Report to [place]. To find out your arrival time please call [phone] between 1PM - 3PM on Thursday 05/26/19.  Remember: Instructions that are not followed completely may result in serious medical risk, up to and including death, or upon the discretion of your surgeon and anesthesiologist your surgery may need to be rescheduled.     _X__ 1. Do not eat food after midnight the night before your procedure.                 No gum chewing or hard candies. You may drink clear liquids up to 2 hours                 before you are scheduled to arrive for your surgery- DO not drink clear                 liquids within 2 hours of the start of your surgery.                 Clear Liquids include:  water, apple juice without pulp, clear carbohydrate                 drink such as Clearfast or Gatorade, Black Coffee or Tea (Do not add                 anything to coffee or tea).  __X__2.  On the morning of surgery brush your teeth with toothpaste and water, you                 may rinse your mouth with mouthwash if you wish.  Do not swallow any              toothpaste of mouthwash.     _X__ 3.  No Alcohol for 24 hours before or after surgery.   _X__ 4.  Do Not Smoke or use e-cigarettes For 24 Hours Prior to Your Surgery.                 Do not use any chewable tobacco products for at least 6 hours prior to                 surgery.  ____  5.  Bring all medications with you on the day of surgery if instructed.   __X__  6.  Notify your doctor if there is any change in your medical condition      (cold, fever, infections).     Do not wear jewelry, make-up, hairpins, clips or nail polish. Do not wear lotions, powders, or perfumes.  Do not shave 48 hours prior to surgery. Men may shave face and neck. Do not bring valuables to the hospital.    [facility] is not responsible for any belongings or  valuables.  Contacts, dentures/partials or body piercings may not be worn into surgery. Bring a case for your contacts, glasses or hearing aids, a denture cup will be supplied. Leave your suitcase in the car. After surgery it may be brought to your room. For patients admitted to the hospital, discharge time is determined by your treatment team.   Patients discharged the day of surgery will not be allowed to drive home.   Please read over the following fact sheets that you were given:   MRSA Information  __X__ Take these medicines the morning of surgery with A SIP OF WATER:  1. esomeprazole (NEXIUM  2. gabapentin (NEURONTIN)   3. levothyroxine (SYNTHROID)  4.  5.  6.  ____ Fleet Enema (as directed)   __X__ Use CHG Soap/SAGE wipes as directed  __X__ Use inhalers on the day of surgery  ____ Stop metformin/Janumet/Farxiga 2 days prior to surgery    ____ Take 1/2 of usual insulin dose the night before surgery. No insulin the morning          of surgery.   ____ Stop Blood Thinners Coumadin/Plavix/Xarelto/Pleta/Pradaxa/Eliquis/Effient/Aspirin  on   Or contact your Surgeon, Cardiologist or Medical Doctor regarding  ability to stop your blood thinners  __X__ Stop Anti-inflammatories 7 days before surgery such as Advil, Ibuprofen, Motrin,  BC or Goodies Powder, Naprosyn, Naproxen, Aleve, Aspirin    __X__ Stop all herbal supplements, fish oil or vitamin E until after surgery.    ____ Bring C-Pap to the hospital.

## 2019-05-25 LAB — NOVEL CORONAVIRUS, NAA (HOSP ORDER, SEND-OUT TO REF LAB; TAT 18-24 HRS): SARS-CoV-2, NAA: NOT DETECTED

## 2019-05-26 MED ORDER — CLINDAMYCIN PHOSPHATE 900 MG/50ML IV SOLN: 900.0000 mg | INTRAVENOUS | Status: DC

## 2019-05-27 ENCOUNTER — Ambulatory Visit: Payer: PPO | Admitting: Anesthesiology

## 2019-05-27 ENCOUNTER — Encounter
Admission: RE | Disposition: A | Discharge status: Home / Self Care | Payer: Self-pay | Source: Home / Self Care | Attending: Podiatry

## 2019-05-27 ENCOUNTER — Other Ambulatory Visit: Payer: Self-pay

## 2019-05-27 ENCOUNTER — Encounter: Payer: Self-pay | Admitting: Emergency Medicine

## 2019-05-27 ENCOUNTER — Ambulatory Visit
Admission: RE | Admit: 2019-05-27 | Discharge: 2019-05-27 | Disposition: A | Discharge status: Home / Self Care | Payer: PPO | Attending: Podiatry | Admitting: Podiatry

## 2019-05-27 DIAGNOSIS — Z7951 Long term (current) use of inhaled steroids: Secondary | ICD-10-CM | POA: Insufficient documentation

## 2019-05-27 DIAGNOSIS — Z01812 Encounter for preprocedural laboratory examination: Secondary | ICD-10-CM | POA: Insufficient documentation

## 2019-05-27 DIAGNOSIS — K219 Gastro-esophageal reflux disease without esophagitis: Secondary | ICD-10-CM | POA: Insufficient documentation

## 2019-05-27 DIAGNOSIS — Z01810 Encounter for preprocedural cardiovascular examination: Secondary | ICD-10-CM | POA: Insufficient documentation

## 2019-05-27 DIAGNOSIS — M205X1 Other deformities of toe(s) (acquired), right foot: Secondary | ICD-10-CM | POA: Diagnosis not present

## 2019-05-27 DIAGNOSIS — E039 Hypothyroidism, unspecified: Secondary | ICD-10-CM | POA: Insufficient documentation

## 2019-05-27 DIAGNOSIS — Z7982 Long term (current) use of aspirin: Secondary | ICD-10-CM | POA: Insufficient documentation

## 2019-05-27 DIAGNOSIS — Z1159 Encounter for screening for other viral diseases: Secondary | ICD-10-CM | POA: Insufficient documentation

## 2019-05-27 DIAGNOSIS — F418 Other specified anxiety disorders: Secondary | ICD-10-CM | POA: Insufficient documentation

## 2019-05-27 DIAGNOSIS — Z79899 Other long term (current) drug therapy: Secondary | ICD-10-CM | POA: Insufficient documentation

## 2019-05-27 DIAGNOSIS — Z79890 Hormone replacement therapy (postmenopausal): Secondary | ICD-10-CM | POA: Insufficient documentation

## 2019-05-27 DIAGNOSIS — M899 Disorder of bone, unspecified: Secondary | ICD-10-CM | POA: Insufficient documentation

## 2019-05-27 DIAGNOSIS — I10 Essential (primary) hypertension: Secondary | ICD-10-CM | POA: Insufficient documentation

## 2019-05-27 DIAGNOSIS — J449 Chronic obstructive pulmonary disease, unspecified: Secondary | ICD-10-CM | POA: Insufficient documentation

## 2019-05-27 HISTORY — PX: CHEILECTOMY: SHX1336

## 2019-05-27 SURGERY — CHEILECTOMY
Anesthesia: General | Laterality: Right

## 2019-05-27 MED ORDER — DEXAMETHASONE SODIUM PHOSPHATE 10 MG/ML IJ SOLN
INTRAMUSCULAR | Status: AC
  Filled 2019-05-27: qty 1

## 2019-05-27 MED ORDER — PROPOFOL 10 MG/ML IV BOLUS
INTRAVENOUS | Status: DC | PRN
  Administered 2019-05-27: 120 mg via INTRAVENOUS

## 2019-05-27 MED ORDER — OXYCODONE HCL 5 MG/5ML PO SOLN: 5.0000 mg | Freq: Once | ORAL | Status: DC | PRN

## 2019-05-27 MED ORDER — LIDOCAINE HCL (PF) 1 % IJ SOLN
INTRAMUSCULAR | Status: AC
  Filled 2019-05-27: qty 30

## 2019-05-27 MED ORDER — LACTATED RINGERS IV SOLN
INTRAVENOUS | Status: DC
  Administered 2019-05-27: 11:00:00 via INTRAVENOUS

## 2019-05-27 MED ORDER — ONDANSETRON HCL 4 MG/2ML IJ SOLN
INTRAMUSCULAR | Status: AC
  Filled 2019-05-27: qty 2

## 2019-05-27 MED ORDER — DEXAMETHASONE SODIUM PHOSPHATE 10 MG/ML IJ SOLN
INTRAMUSCULAR | Status: DC | PRN
  Administered 2019-05-27: 5 mg via INTRAVENOUS

## 2019-05-27 MED ORDER — POVIDONE-IODINE 7.5 % EX SOLN
Freq: Once | CUTANEOUS | Status: DC
  Filled 2019-05-27: qty 118

## 2019-05-27 MED ORDER — LIDOCAINE HCL (CARDIAC) PF 100 MG/5ML IV SOSY
PREFILLED_SYRINGE | INTRAVENOUS | Status: DC | PRN
  Administered 2019-05-27: 80 mg via INTRAVENOUS

## 2019-05-27 MED ORDER — LIDOCAINE HCL (PF) 2 % IJ SOLN
INTRAMUSCULAR | Status: AC
  Filled 2019-05-27: qty 10

## 2019-05-27 MED ORDER — EPHEDRINE SULFATE 50 MG/ML IJ SOLN
INTRAMUSCULAR | Status: AC
  Filled 2019-05-27: qty 1

## 2019-05-27 MED ORDER — FENTANYL CITRATE (PF) 100 MCG/2ML IJ SOLN
INTRAMUSCULAR | Status: AC
  Filled 2019-05-27: qty 2

## 2019-05-27 MED ORDER — CLINDAMYCIN PHOSPHATE 900 MG/50ML IV SOLN
INTRAVENOUS | Status: AC
  Filled 2019-05-27: qty 50

## 2019-05-27 MED ORDER — ONDANSETRON HCL 4 MG/2ML IJ SOLN
INTRAMUSCULAR | Status: DC | PRN
  Administered 2019-05-27: 4 mg via INTRAVENOUS

## 2019-05-27 MED ORDER — BUPIVACAINE HCL (PF) 0.5 % IJ SOLN
INTRAMUSCULAR | Status: AC
  Filled 2019-05-27: qty 30

## 2019-05-27 MED ORDER — MIDAZOLAM HCL 2 MG/2ML IJ SOLN
INTRAMUSCULAR | Status: AC
  Filled 2019-05-27: qty 2

## 2019-05-27 MED ORDER — OXYCODONE HCL 5 MG PO TABS: 5.0000 mg | ORAL_TABLET | ORAL | 0 refills | Status: DC | PRN

## 2019-05-27 MED ORDER — FENTANYL CITRATE (PF) 100 MCG/2ML IJ SOLN: 25.0000 ug | INTRAMUSCULAR | Status: DC | PRN

## 2019-05-27 MED ORDER — EPHEDRINE SULFATE 50 MG/ML IJ SOLN
INTRAMUSCULAR | Status: DC | PRN
  Administered 2019-05-27: 5 mg via INTRAVENOUS

## 2019-05-27 MED ORDER — MIDAZOLAM HCL 2 MG/2ML IJ SOLN
INTRAMUSCULAR | Status: DC | PRN
  Administered 2019-05-27: 2 mg via INTRAVENOUS

## 2019-05-27 MED ORDER — FENTANYL CITRATE (PF) 100 MCG/2ML IJ SOLN
INTRAMUSCULAR | Status: DC | PRN
  Administered 2019-05-27 (×2): 25 ug via INTRAVENOUS

## 2019-05-27 MED ORDER — OXYCODONE HCL 5 MG PO TABS: 5.0000 mg | ORAL_TABLET | Freq: Once | ORAL | Status: DC | PRN

## 2019-05-27 MED ORDER — BUPIVACAINE HCL (PF) 0.5 % IJ SOLN
INTRAMUSCULAR | Status: DC | PRN
  Administered 2019-05-27: 16 mL

## 2019-05-27 MED ORDER — CLINDAMYCIN PHOSPHATE 900 MG/50ML IV SOLN
900.0000 mg | INTRAVENOUS | Status: AC
  Administered 2019-05-27: 900 mg via INTRAVENOUS

## 2019-05-27 MED ORDER — PROPOFOL 10 MG/ML IV BOLUS
INTRAVENOUS | Status: AC
  Filled 2019-05-27: qty 20

## 2019-05-27 SURGICAL SUPPLY — 45 items
BANDAGE ELASTIC 4 LF NS (GAUZE/BANDAGES/DRESSINGS) IMPLANT
BLADE MED AGGRESSIVE (BLADE) ×3 IMPLANT
BLADE SURG 15 STRL LF DISP TIS (BLADE) ×2 IMPLANT
BLADE SURG 15 STRL SS (BLADE) ×4
BLADE SURG MINI STRL (BLADE) ×3 IMPLANT
BNDG CONFORM 2 STRL LF (GAUZE/BANDAGES/DRESSINGS) ×3 IMPLANT
BNDG CONFORM 3 STRL LF (GAUZE/BANDAGES/DRESSINGS) IMPLANT
BNDG ESMARK 4X12 TAN STRL LF (GAUZE/BANDAGES/DRESSINGS) ×3 IMPLANT
BNDG GAUZE 4.5X4.1 6PLY STRL (MISCELLANEOUS) IMPLANT
CLOSURE WOUND 1/4X4 (GAUZE/BANDAGES/DRESSINGS)
COVER WAND RF STERILE (DRAPES) ×3 IMPLANT
CUFF TOURN SGL QUICK 12 (TOURNIQUET CUFF) IMPLANT
CUFF TOURN SGL QUICK 18X4 (TOURNIQUET CUFF) ×3 IMPLANT
DRAPE FLUOR MINI C-ARM 54X84 (DRAPES) ×3 IMPLANT
DURAPREP 26ML APPLICATOR (WOUND CARE) ×3 IMPLANT
ELECT REM PT RETURN 9FT ADLT (ELECTROSURGICAL) ×3
ELECTRODE REM PT RTRN 9FT ADLT (ELECTROSURGICAL) ×1 IMPLANT
GAUZE SPONGE 4X4 12PLY STRL (GAUZE/BANDAGES/DRESSINGS) ×3 IMPLANT
GAUZE XEROFORM 1X8 LF (GAUZE/BANDAGES/DRESSINGS) IMPLANT
GLOVE BIO SURGEON STRL SZ7.5 (GLOVE) ×6 IMPLANT
GLOVE INDICATOR 8.0 STRL GRN (GLOVE) ×6 IMPLANT
GOWN STRL REUS W/ TWL LRG LVL3 (GOWN DISPOSABLE) ×2 IMPLANT
GOWN STRL REUS W/TWL LRG LVL3 (GOWN DISPOSABLE) ×4
KIT TURNOVER KIT A (KITS) ×3 IMPLANT
LABEL OR SOLS (LABEL) ×3 IMPLANT
NEEDLE FILTER BLUNT 18X 1/2SAF (NEEDLE) ×2
NEEDLE FILTER BLUNT 18X1 1/2 (NEEDLE) ×1 IMPLANT
NEEDLE HYPO 25X1 1.5 SAFETY (NEEDLE) ×6 IMPLANT
NS IRRIG 500ML POUR BTL (IV SOLUTION) ×3 IMPLANT
PACK EXTREMITY ARMC (MISCELLANEOUS) ×3 IMPLANT
PAD CAST CTTN 4X4 STRL (SOFTGOODS) IMPLANT
PADDING CAST COTTON 4X4 STRL (SOFTGOODS)
PENCIL ELECTRO HAND CTR (MISCELLANEOUS) ×3 IMPLANT
RASP SM TEAR CROSS CUT (RASP) ×3 IMPLANT
SOL PREP PVP 2OZ (MISCELLANEOUS) ×3
SOLUTION PREP PVP 2OZ (MISCELLANEOUS) ×1 IMPLANT
SPLINT FAST PLASTER 5X30 (CAST SUPPLIES)
SPLINT PLASTER CAST FAST 5X30 (CAST SUPPLIES) IMPLANT
STOCKINETTE STRL 6IN 960660 (GAUZE/BANDAGES/DRESSINGS) ×3 IMPLANT
STRAP SAFETY 5IN WIDE (MISCELLANEOUS) ×3 IMPLANT
STRIP CLOSURE SKIN 1/4X4 (GAUZE/BANDAGES/DRESSINGS) IMPLANT
SUT ETHILON 5-0 FS-2 18 BLK (SUTURE) IMPLANT
SUT VIC AB 4-0 FS2 27 (SUTURE) ×3 IMPLANT
SYR 10ML LL (SYRINGE) ×6 IMPLANT
WIRE Z .045 C-WIRE SPADE TIP (WIRE) ×3 IMPLANT

## 2019-05-27 NOTE — Discharge Instructions (Addendum)
1.  Elevate the right leg on 2 pillows.  2.  Keep the bandage on the right foot clean, dry, and do not remove.  3.  Sponge bathe only right lower extremity.  4.  Wear surgical shoe on the right foot whenever walking or standing.  5.  Take 1 pain pill, oxycodone, every 4 hours only if needed for pain.  AMBULATORY SURGERY  DISCHARGE INSTRUCTIONS   1) The drugs that you were given will stay in your system until tomorrow so for the next 24 hours you should not:  A) Drive an automobile B) Make any legal decisions C) Drink any alcoholic beverage   2) You may resume regular meals tomorrow.  Today it is better to start with liquids and gradually work up to solid foods.  You may eat anything you prefer, but it is better to start with liquids, then soup and crackers, and gradually work up to solid foods.   3) Please notify your doctor immediately if you have any unusual bleeding, trouble breathing, redness and pain at the surgery site, drainage, fever, or pain not relieved by medication.    4) Additional Instructions:        Please contact your physician with any problems or Same Day Surgery at [phone], Monday through Friday 6 am to 4 pm, or [place] [facility] number at [phone].

## 2019-05-27 NOTE — Interval H&P Note (Signed)
History and Physical Interval Note:  05/27/2019 11:32 AM  Janet Osborne  has presented today for surgery, with the diagnosis of Hallux Limitus M20.5X1.  The various methods of treatment have been discussed with the patient and family. After consideration of risks, benefits and other options for treatment, the patient has consented to  Procedure(s): CHEILECTOMY (Right) as a surgical intervention.  The patient's history has been reviewed, patient examined, no change in status, stable for surgery.  I have reviewed the patient's chart and labs.  Questions were answered to the patient's satisfaction.     Durward Fortes

## 2019-05-27 NOTE — Op Note (Signed)
Date of operation: 05/27/2019.  Surgeon: Durward Fortes D.P.M.  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  Postoperative diagnosis: Same.  Procedure: Cheilectomy right great toe joint.  Anesthesia: LMA with local.  Hemostasis: Pneumatic tourniquet right ankle 250 mmHg.  Estimated blood loss: Less than 5 cc.  Pathology: None.  Implants: None.  Complications: None apparent.  Operative indications: This is a 68 year old female with a chief complaint of some chronic painful arthritis in her right great toe joint.  Patient elects for surgical cheilectomy to try to clean the joint up.  Operative procedure: Patient was taken to the operating room and placed on the table in the supine position.  Following satisfactory LMA anesthesia the right foot was anesthetized with 10 cc of 0.5% Marcaine plain.  A pneumatic tourniquet was applied at the level of the right ankle and the foot was prepped and draped in the usual sterile fashion.  The foot was exsanguinated and the tourniquet inflated to 250 mmHg.      Attention was directed to the dorsal aspect of the right foot where an approximate 4 cm linear incision was made coursing proximal to distal over the first metatarsal and metatarsal phalangeal joint.  Incision was deepened down to the level of the joint where a linear capsulotomy was performed.  Significant synovitis was encountered at the dorsal aspect of the joint.  Dorsal and medial bone prominence was noted and the capsular periosteal tissues reflected off of the head of the first metatarsal and the medial and dorsal eminences were resected using a sagittal saw.  Dorsal prominence also resected off of the base of the proximal phalanx.  There was noted to be significant loss of articular cartilage covering the distal aspect of the first metatarsal as well as the base of the proximal phalanx.  Drill holes were then placed into both the head and base using a 0.045  inch K wire.  The edges were rasped smooth and the wound was flushed with copious amounts of sterile saline.  Intraoperative FluoroScan views obtained.  The wound was then closed using 4-0 Vicryl running suture for all layers from capsular and periosteal closure through deep and superficial subcutaneous and followed by skin closure.  Tincture of benzoin Steri-Strips 4 x 4's and con form applied.  Tourniquet released and blood flow noted return immediately to the right foot and all digits.  Kerlix and an Ace wrap applied for compression.  Patient was awakened and transported to the PACU with vital signs stable and in good condition.

## 2019-05-27 NOTE — Anesthesia Post-op Follow-up Note (Signed)
Anesthesia QCDR form completed.        

## 2019-05-27 NOTE — Anesthesia Preprocedure Evaluation (Addendum)
Anesthesia Evaluation  Patient identified by MRN, date of birth, ID band Patient awake    Reviewed: Allergy & Precautions, H&P , NPO status , Patient's Chart, lab work & pertinent test results  Airway Mallampati: II  TM Distance: >3 FB Neck ROM: limited   Comment: H/o cervical fusion Dental  (+) Teeth Intact   Pulmonary COPD, former smoker,           Cardiovascular hypertension,      Neuro/Psych  Headaches, PSYCHIATRIC DISORDERS Anxiety Depression    GI/Hepatic Neg liver ROS, GERD  Controlled,  Endo/Other  Hypothyroidism   Renal/GU      Musculoskeletal   Abdominal   Peds  Hematology negative hematology ROS (+)   Anesthesia Other Findings Past Medical History: No date: Abdominal hernia No date: Anemia No date: Anxiety No date: Chronic constipation No date: Colon polyps No date: Complication of anesthesia     Comment:  coughing episodes after colonoscopy and sugical               procedure 2011 (hx copd) No date: COPD (chronic obstructive pulmonary disease) (HCC) No date: DDD (degenerative disc disease), cervical No date: Depression No date: Endometriosis No date: Essential hypertension, benign No date: Fibrocystic breast disease No date: GERD (gastroesophageal reflux disease) 03/17/2017: H/O hand surgery No date: Hemorrhoids No date: Hyperlipidemia No date: Hypothyroidism No date: Migraine No date: Nontoxic multinodular goiter No date: Osteoarthrosis involving, or with mention of more than one  site, but not specified as generalized, multiple sites No date: Tobacco use  Past Surgical History: No date: ABDOMINAL HYSTERECTOMY No date: ANTERIOR CERVICAL DECOMP/DISCECTOMY FUSION No date: APPENDECTOMY No date: CARPAL TUNNEL RELEASE; Bilateral No date: COLONOSCOPY No date: ESOPHAGOGASTRODUODENOSCOPY 04/03/2017: ESOPHAGOGASTRODUODENOSCOPY (EGD) WITH PROPOFOL; N/A     Comment:  Procedure:  ESOPHAGOGASTRODUODENOSCOPY (EGD) WITH               PROPOFOL;  Surgeon: Manya Silvas, MD;  Location: [facility];  Service: Endoscopy;  Laterality: N/A; No date: HERNIA REPAIR No date: POLYPECTOMY No date: ROTATOR CUFF REPAIR; Left No date: thumb surgery No date: TONSILLECTOMY No date: TRIGGER FINGER RELEASE; Left     Reproductive/Obstetrics negative OB ROS                            Anesthesia Physical Anesthesia Plan  ASA: II  Anesthesia Plan: General LMA   Post-op Pain Management:    Induction:   PONV Risk Score and Plan: Dexamethasone, Ondansetron, Midazolam and Treatment may vary due to age or medical condition  Airway Management Planned:   Additional Equipment:   Intra-op Plan:   Post-operative Plan:   Informed Consent: I have reviewed the patients History and Physical, chart, labs and discussed the procedure including the risks, benefits and alternatives for the proposed anesthesia with the patient or authorized representative who has indicated his/her understanding and acceptance.     Dental Advisory Given  Plan Discussed with: Anesthesiologist and CRNA  Anesthesia Plan Comments:         Anesthesia Quick Evaluation

## 2019-05-27 NOTE — H&P (Signed)
Subjective: Patient presents for cheilectomy of her right great toe joint.  Objective: Stiff range of motion with palpable exostosis at the right great toe joint.  Pedal pulses and neurologically intact.  Assessment: Hallux limitus right great toe joint.  Plan: Medical history and physical in the chart was reviewed.  Patient stable for surgery.

## 2019-05-27 NOTE — Transfer of Care (Signed)
Immediate Anesthesia Transfer of Care Note  Patient: Janet Osborne  Procedure(s) Performed: CHEILECTOMY (Right )  Patient Location: PACU  Anesthesia Type:General  Level of Consciousness: awake, alert  and oriented  Airway & Oxygen Therapy: Patient Spontanous Breathing and Patient connected to face mask oxygen  Post-op Assessment: Report given to RN and Post -op Vital signs reviewed and stable  Post vital signs: Reviewed and stable  Last Vitals:  Vitals Value Taken Time  BP 123/66 05/27/19 1315  Temp 36.2 C 05/27/19 1315  Pulse 61 05/27/19 1317  Resp 14 05/27/19 1317  SpO2 100 % 05/27/19 1317  Vitals shown include unvalidated device data.  Last Pain:  Vitals:   05/27/19 1315  TempSrc:   PainSc: 0-No pain         Complications: No apparent anesthesia complications

## 2019-05-27 NOTE — Anesthesia Postprocedure Evaluation (Signed)
Anesthesia Post Note  Patient: Janet Osborne  Procedure(s) Performed: CHEILECTOMY (Right )  Patient location during evaluation: PACU Anesthesia Type: General Level of consciousness: awake and alert Pain management: pain level controlled Vital Signs Assessment: post-procedure vital signs reviewed and stable Respiratory status: spontaneous breathing, nonlabored ventilation and respiratory function stable Cardiovascular status: blood pressure returned to baseline and stable Postop Assessment: no apparent nausea or vomiting Anesthetic complications: no     Last Vitals:  Vitals:   05/27/19 1315 05/27/19 1330  BP: 123/66 (!) 138/93  Pulse: 68 64  Resp: 16 18  Temp: (!) 36.2 C   SpO2: 100% 100%    Last Pain:  Vitals:   05/27/19 1330  TempSrc:   PainSc: 0-No pain                 Durenda Hurt

## 2019-05-27 NOTE — Anesthesia Procedure Notes (Addendum)
Procedure Name: General with mask airway Date/Time: 05/27/2019 12:04 PM Performed by: Fredderick Phenix, CRNA Pre-anesthesia Checklist: Patient identified, Emergency Drugs available, Suction available and Patient being monitored Patient Re-evaluated:Patient Re-evaluated prior to induction Oxygen Delivery Method: Circle system utilized Preoxygenation: Pre-oxygenation with 100% oxygen Induction Type: IV induction Ventilation: Mask ventilation without difficulty LMA: LMA inserted LMA Size: 3.5 Number of attempts: 1 Airway Equipment and Method: Oral airway Placement Confirmation: positive ETCO2 and breath sounds checked- equal and bilateral Tube secured with: Tape Dental Injury: Teeth and Oropharynx as per pre-operative assessment

## 2019-05-28 ENCOUNTER — Encounter: Payer: Self-pay | Admitting: Podiatry

## 2019-05-30 ENCOUNTER — Other Ambulatory Visit: Payer: Self-pay | Admitting: *Deleted

## 2019-05-30 NOTE — Progress Notes (Signed)
Patient stated she is using a polar care on her foot. I asked her to call the doctor about pain, ice, and swelling. There is no where in her chart that states she could use the ice.

## 2019-05-30 NOTE — Patient Outreach (Signed)
[facility]) Care Management  05/30/2019  Janet Osborne [facility] 10-06-1951 [id number]  RN Health Coach is closing this program. Consumer is enrolled in [facility] external program.  [place] Care Management [phone]

## 2019-05-31 DIAGNOSIS — M205X1 Other deformities of toe(s) (acquired), right foot: Secondary | ICD-10-CM | POA: Diagnosis not present

## 2019-06-30 ENCOUNTER — Telehealth: Payer: Self-pay | Admitting: Internal Medicine

## 2019-06-30 NOTE — Telephone Encounter (Signed)

## 2019-07-01 ENCOUNTER — Ambulatory Visit: Payer: PPO | Admitting: Internal Medicine

## 2019-07-01 ENCOUNTER — Encounter: Payer: Self-pay | Admitting: Internal Medicine

## 2019-07-01 ENCOUNTER — Other Ambulatory Visit: Payer: Self-pay

## 2019-07-01 VITALS — BP 150/82 | HR 53 | Ht 61.0 in | Wt 159.0 lb

## 2019-07-01 DIAGNOSIS — R0789 Other chest pain: Secondary | ICD-10-CM

## 2019-07-01 DIAGNOSIS — I10 Essential (primary) hypertension: Secondary | ICD-10-CM | POA: Diagnosis not present

## 2019-07-01 DIAGNOSIS — R0609 Other forms of dyspnea: Secondary | ICD-10-CM

## 2019-07-01 DIAGNOSIS — Z01810 Encounter for preprocedural cardiovascular examination: Secondary | ICD-10-CM

## 2019-07-01 NOTE — Progress Notes (Signed)
New Outpatient Visit Date: 07/01/2019  Referring Provider: Idelle Crouch, MD [facility] [address] [place],  [place] [address]  Chief Complaint: Shortness of breath  HPI:  [name] is a 68 y.o. female who is being seen today for the evaluation of shortness of breath. She has a history of hypertension, hyperlipidemia, COPD, and thyroid disease.  [name] reports longstanding shortness of breath, though it worsened significantly after bronchitis last summer.  She was diagnosed with "moderate" COPD, having previously smoked for 45 years.  However, her breathing has continued to become progressively worse since last year.  She is concerned that it could be her heart.  She occasionally has "soreness" under her left breast that is nonexertional and comes and goes randomly.  There are no associated symptoms.  She was relatively active up until about 4 months ago, walking regularly and doing water aerobics.  However, this has decreased with the COVID-19 pandemic as well as foot surgery in June.  She has not sleep well but denies orthopnea.  She has been told by her husband that she snores and sometimes has "funky" breathing.  She has never been evaluated for sleep apnea.  She notes occasional swelling in her left ankle, which seems to be worse at night and resolves by the next morning.  [name] by Dr. Fletcher Anon in 2015 for evaluation of chest pain.  Symptoms were felt to be atypical and exercise tolerance test was recommended.  This was never performed.  [name] underwent exercise stress echocardiogram in 09/2018 at [facility].  This was normal without evidence of inducible wall motion abnormality.  No significant valvular disease was identified.  She denies palpitations and lightheadedness.  --------------------------------------------------------------------------------------------------  Cardiovascular History & Procedures: Cardiovascular Problems:  Dyspnea on  exertion  Atypical chest pain  Risk Factors:  Hypertension, hyperlipidemia, prior tobacco use, and age greater than 73  Cath/PCI:  None  CV Surgery:  None  EP Procedures and Devices:  None  Non-Invasive Evaluation(s):  Exercise stress echocardiogram (09/15/2018, [facility]): Normal baseline LVEF (greater than 55%) with hyperdynamic function with stress.  No regional wall motion abnormality.  Trivial aortic regurgitation.  Mild mitral and tricuspid regurgitation.  Recent CV Pertinent Labs: Lab Results  Component Value Date   K 3.9 08/27/2018   BUN 15 08/27/2018   CREATININE 0.65 08/27/2018    --------------------------------------------------------------------------------------------------  Past Medical History:  Diagnosis Date  . Abdominal hernia   . Anemia   . Anxiety   . Chronic constipation   . Colon polyps   . Complication of anesthesia    coughing episodes after colonoscopy and sugical procedure 2011 (hx copd)  . COPD (chronic obstructive pulmonary disease) ([place])   . DDD (degenerative disc disease), cervical   . Depression   . Endometriosis   . Essential hypertension, benign   . Fibrocystic breast disease   . GERD (gastroesophageal reflux disease)   . H/O hand surgery 03/17/2017  . Hemorrhoids   . Hyperlipidemia   . Hypothyroidism   . Migraine   . Nontoxic multinodular goiter   . Osteoarthrosis involving, or with mention of more than one site, but not specified as generalized, multiple sites   . Tobacco use     Past Surgical History:  Procedure Laterality Date  . ABDOMINAL HYSTERECTOMY    . ANTERIOR CERVICAL DECOMP/DISCECTOMY FUSION    . APPENDECTOMY    . CARPAL TUNNEL RELEASE Bilateral   . CHEILECTOMY Right 05/27/2019   Procedure:  CHEILECTOMY;  Surgeon: Sharlotte Alamo, DPM;  Location: [facility];  Service: Podiatry;  Laterality: Right;  . COLONOSCOPY    . ESOPHAGOGASTRODUODENOSCOPY    . ESOPHAGOGASTRODUODENOSCOPY (EGD) WITH PROPOFOL N/A  04/03/2017   Procedure: ESOPHAGOGASTRODUODENOSCOPY (EGD) WITH PROPOFOL;  Surgeon: Manya Silvas, MD;  Location: [facility] ENDOSCOPY;  Service: Endoscopy;  Laterality: N/A;  . HERNIA REPAIR    . POLYPECTOMY    . ROTATOR CUFF REPAIR Left   . thumb surgery    . TONSILLECTOMY    . TRIGGER FINGER RELEASE Left     Current Meds  Medication Sig  . acetaminophen (TYLENOL) 500 MG tablet Take 500 mg by mouth 2 (two) times a day.  . albuterol (VENTOLIN HFA) 108 (90 Base) MCG/ACT inhaler Inhale 1-2 puffs into the lungs every 6 (six) hours as needed for wheezing or shortness of breath.  Marland Kitchen aspirin 81 MG chewable tablet Chew 81 mg by mouth daily.  . budesonide-formoterol (SYMBICORT) 160-4.5 MCG/ACT inhaler Inhale 2 puffs into the lungs 2 (two) times daily.  . Calcium Carbonate-Vit D-Min (CALTRATE 600+D PLUS MINERALS) 600-800 MG-UNIT CHEW Chew 1 tablet by mouth daily.  . Cholecalciferol (VITAMIN D) 50 MCG (2000 UT) CAPS Take 2,000 Units by mouth at bedtime.  . Cyanocobalamin (VITAMIN B-12) 5000 MCG TBDP Take 5,000 mcg by mouth at bedtime.  . diazepam (VALIUM) 5 MG tablet Take 5 mg by mouth every 6 (six) hours as needed for anxiety.  Marland Kitchen esomeprazole (NEXIUM) 40 MG capsule Take 40 mg by mouth daily at 12 noon.  Marland Kitchen estradiol (ESTRACE) 0.5 MG tablet Take 0.5 mg by mouth at bedtime.  . gabapentin (NEURONTIN) 300 MG capsule Take 300-600 mg by mouth See admin instructions. Take 300mg  in the AM, and 600mg  at bedtime.  . hydrochlorothiazide (HYDRODIURIL) 25 MG tablet Take 25 mg by mouth daily.   Marland Kitchen levothyroxine (SYNTHROID) 100 MCG tablet Take 100 mcg by mouth daily before breakfast.  . loratadine (CLARITIN REDITABS) 10 MG dissolvable tablet Take 10 mg by mouth daily as needed for allergies.  Marland Kitchen losartan (COZAAR) 50 MG tablet Take 50 mg by mouth daily.  . montelukast (SINGULAIR) 10 MG tablet Take 10 mg by mouth at bedtime.  . Multiple Vitamins-Minerals (EYE VITAMINS PO) Take 1 tablet by mouth daily.   . pravastatin  (PRAVACHOL) 40 MG tablet Take 40 mg by mouth every evening.   . sodium chloride (MURO 128) 5 % ophthalmic solution Place 1 drop into the left eye 2 (two) times a day.  . traMADol (ULTRAM) 50 MG tablet Take 50 mg by mouth every 6 (six) hours as needed for moderate pain.     Allergies: Macrodantin [nitrofurantoin macrocrystal] and Penicillins  Social History   Tobacco Use  . Smoking status: Former Smoker    Packs/day: 0.75    Years: 45.00    Pack years: 33.75    Types: Cigarettes    Quit date: 07/2018    Years since quitting: 0.9  . Smokeless tobacco: Never Used  . Tobacco comment: Uses nicotine patch  Substance Use Topics  . Alcohol use: Yes    Comment: occassional  . Drug use: No    Family History  Problem Relation Age of Onset  . Hypertension Mother   . Clotting disorder Mother   . Diabetes Mother   . Dementia Mother   . Osteoarthritis Mother   . Gallbladder disease Mother   . Heart disease Mother   . Hypertension Father   . Prostate cancer Father   .  COPD Father   . Osteoarthritis Father   . Gallbladder disease Father   . Colon polyps Father   . Osteoarthritis Sister   . Thyroid disease Sister   . HIV Brother   . Breast cancer Neg Hx     Review of Systems: A 12-system review of systems was performed and was negative except as noted in the HPI.  --------------------------------------------------------------------------------------------------  Physical Exam: BP (!) 150/82 (BP Location: Left Arm, Patient Position: Sitting, Cuff Size: Normal)   Pulse (!) 53   Ht 5\' 1"  (1.549 m)   Wt 159 lb (72.1 kg)   BMI 30.04 kg/m   General: NAD. HEENT: No conjunctival pallor or scleral icterus.  Facemask in place Neck: Supple without lymphadenopathy, thyromegaly, JVD, or HJR. No carotid bruit. Lungs: Normal work of breathing. Clear to auscultation bilaterally without wheezes or crackles. Heart: Regular rate and rhythm without murmurs, rubs, or gallops. Non-displaced  PMI. Abd: Bowel sounds present. Soft, NT/ND without hepatosplenomegaly Ext: No lower extremity edema. Radial, PT, and DP pulses are 2+ bilaterally Skin: Warm and dry without rash. Neuro: CNIII-XII intact. Strength and fine-touch sensation intact in upper and lower extremities bilaterally. Psych: Normal mood and affect.  EKG: Sinus bradycardia (heart rate 53 bpm) with low voltage.  No significant abnormality.  Lab Results  Component Value Date   WBC 7.8 08/27/2018   HGB 14.7 08/27/2018   HCT 42.0 08/27/2018   MCV 91.3 08/27/2018   PLT 269 08/27/2018    Lab Results  Component Value Date   NA 139 08/27/2018   K 3.9 08/27/2018   CL 105 08/27/2018   CO2 24 08/27/2018   BUN 15 08/27/2018   CREATININE 0.65 08/27/2018   GLUCOSE 117 (H) 08/27/2018    No results found for: CHOL, HDL, LDLCALC, LDLDIRECT, TRIG, CHOLHDL   --------------------------------------------------------------------------------------------------  ASSESSMENT AND PLAN: Dyspnea on exertion and atypical chest pain: Symptoms have been longstanding, though dyspnea on exertion seems to have worsened after episode of bronchitis last summer.  I suspect underlying COPD is the driving factor.  Exercise stress echocardiogram last year showed no evidence of ischemia.  Mild valvular disease was noted, unlikely to explain her symptoms.  Cardiac risk factors include hypertension, hyperlipidemia, age, and prior tobacco use.  Given that symptoms have worsened despite treatment of COPD, we have agreed to obtain a cardiac CTA to further evaluate for CAD as well as parenchymal lung disease.  Will not make any medication changes today.  We will also refer [name] to pulmonary for further evaluation of COPD and possible sleep apnea (reports of snoring and "funky" breathing at night).  Hypertension: Blood pressure mildly elevated today but typically better when seen by her PCP.  Defer medication changes today.  Follow-up: Return to  clinic in 6 weeks.  [name], MD 07/01/2019 9:16 AM

## 2019-07-01 NOTE — Patient Instructions (Signed)
Medication Instructions:  Your physician recommends that you continue on your current medications as directed. Please refer to the Current Medication list given to you today.  If you need a refill on your cardiac medications before your next appointment, please call your pharmacy.   Lab work: Your physician recommends that you return for lab work in: [facility] - BMET.  If you have labs (blood work) drawn today and your tests are completely normal, you will receive your results only by: Marland Kitchen MyChart Message (if you have MyChart) OR . A paper copy in the mail If you have any lab test that is abnormal or we need to change your treatment, we will call you to review the results.  Testing/Procedures: Your physician has requested that you have cardiac CT. Cardiac computed tomography (CT) is a painless test that uses an x-ray machine to take clear, detailed pictures of your heart.   Please arrive at the [facility] main entrance of [facility] at xx:xx AM (30-45 minutes prior to test start time)  [facility] [place], [place] [address] [phone]  Proceed to the [facility] Radiology Department (First Floor).  Please follow these instructions carefully (unless otherwise directed):   On the Night Before the Test: . Be sure to Drink plenty of water. . Do not consume any caffeinated/decaffeinated beverages or chocolate 12 hours prior to your test. . Do not take any antihistamines 12 hours prior to your test.   On the Day of the Test: . Drink plenty of water. Do not drink any water within one hour of the test. . Do not eat any food 4 hours prior to the test. . You may take your regular medications prior to the test.        After the Test: . Drink plenty of water. . After receiving IV contrast, you may experience a mild flushed feeling. This is normal. . On occasion, you may experience a mild rash up to 24 hours after the test. This is not dangerous. If  this occurs, you can take Benadryl 25 mg and increase your fluid intake. . If you experience trouble breathing, this can be serious. If it is severe call 911 IMMEDIATELY. If it is mild, please call our office.   Follow-Up: You have been referred to Pulmonology for COPD and sleep evaluation.   At [facility], you and your health needs are our priority.  As part of our continuing mission to provide you with exceptional heart care, we have created designated Provider Care Teams.  These Care Teams include your primary Cardiologist (physician) and Advanced Practice Providers (APPs -  Physician Assistants and Nurse Practitioners) who all work together to provide you with the care you need, when you need it. You will need a follow up appointment in 6 weeks.  Please call our office 2 months in advance to schedule this appointment.  You may see DR Harrell Gave END or one of the following Advanced Practice Providers on your designated Care Team:   Murray Hodgkins, NP Christell Faith, PA-C . Marrianne Mood, PA-C     Cardiac CT Angiogram  A cardiac CT angiogram is a procedure to look at the heart and the area around the heart. It may be done to help find the cause of chest pains or other symptoms of heart disease. During this procedure, a large X-ray machine, called a CT scanner, takes detailed pictures of the heart and the surrounding area after a dye (contrast material)  has been injected into blood vessels in the area. The procedure is also sometimes called a coronary CT angiogram, coronary artery scanning, or CTA. A cardiac CT angiogram allows the health care provider to see how well blood is flowing to and from the heart. The health care provider will be able to see if there are any problems, such as:  Blockage or narrowing of the coronary arteries in the heart.  Fluid around the heart.  Signs of weakness or disease in the muscles, valves, and tissues of the heart. Tell a health care provider  about:  Any allergies you have. This is especially important if you have had a previous allergic reaction to contrast dye.  All medicines you are taking, including vitamins, herbs, eye drops, creams, and over-the-counter medicines.  Any blood disorders you have.  Any surgeries you have had.  Any medical conditions you have.  Whether you are pregnant or may be pregnant.  Any anxiety disorders, chronic pain, or other conditions you have that may increase your stress or prevent you from lying still. What are the risks? Generally, this is a safe procedure. However, problems may occur, including:  Bleeding.  Infection.  Allergic reactions to medicines or dyes.  Damage to other structures or organs.  Kidney damage from the dye or contrast that is used.  Increased risk of cancer from radiation exposure. This risk is low. Talk with your health care provider about: ? The risks and benefits of testing. ? How you can receive the lowest dose of radiation. What happens before the procedure?  Wear comfortable clothing and remove any jewelry, glasses, dentures, and hearing aids.  Follow instructions from your health care provider about eating and drinking. This may include: ? For 12 hours before the test - avoid caffeine. This includes tea, coffee, soda, energy drinks, and diet pills. Drink plenty of water or other fluids that do not have caffeine in them. Being well-hydrated can prevent complications. ? For 4-6 hours before the test - stop eating and drinking. The contrast dye can cause nausea, but this is less likely if your stomach is empty.  Ask your health care provider about changing or stopping your regular medicines. This is especially important if you are taking diabetes medicines, blood thinners, or medicines to treat erectile dysfunction. What happens during the procedure?  Hair on your chest may need to be removed so that small sticky patches called electrodes can be placed on  your chest. These will transmit information that helps to monitor your heart during the test.  An IV tube will be inserted into one of your veins.  You might be given a medicine to control your heart rate during the test. This will help to ensure that good images are obtained.  You will be asked to lie on an exam table. This table will slide in and out of the CT machine during the procedure.  Contrast dye will be injected into the IV tube. You might feel warm, or you may get a metallic taste in your mouth.  You will be given a medicine (nitroglycerin) to relax (dilate) the arteries in your heart.  The table that you are lying on will move into the CT machine tunnel for the scan.  The person running the machine will give you instructions while the scans are being done. You may be asked to: ? Keep your arms above your head. ? Hold your breath. ? Stay very still, even if the table is moving.  When  the scanning is complete, you will be moved out of the machine.  The IV tube will be removed. The procedure may vary among health care providers and hospitals. What happens after the procedure?  You might feel warm, or you may get a metallic taste in your mouth from the contrast dye.  You may have a headache from the nitroglycerin.  After the procedure, drink water or other fluids to wash (flush) the contrast material out of your body.  Contact a health care provider if you have any symptoms of allergy to the contrast. These symptoms include: ? Shortness of breath. ? Rash or hives. ? A racing heartbeat.  Most people can return to their normal activities right after the procedure. Ask your health care provider what activities are safe for you.  It is up to you to get the results of your procedure. Ask your health care provider, or the department that is doing the procedure, when your results will be ready. Summary  A cardiac CT angiogram is a procedure to look at the heart and the area  around the heart. It may be done to help find the cause of chest pains or other symptoms of heart disease.  During this procedure, a large X-ray machine, called a CT scanner, takes detailed pictures of the heart and the surrounding area after a dye (contrast material) has been injected into blood vessels in the area.  Ask your health care provider about changing or stopping your regular medicines before the procedure. This is especially important if you are taking diabetes medicines, blood thinners, or medicines to treat erectile dysfunction.  After the procedure, drink water or other fluids to wash (flush) the contrast material out of your body. This information is not intended to replace advice given to you by your health care provider. Make sure you discuss any questions you have with your health care provider. Document Released: 11/06/2008 Document Revised: 11/06/2017 Document Reviewed: 10/13/2016 Elsevier Patient Education  2020 [employer].

## 2019-07-02 ENCOUNTER — Encounter: Payer: Self-pay | Admitting: Internal Medicine

## 2019-07-02 DIAGNOSIS — R0602 Shortness of breath: Secondary | ICD-10-CM | POA: Insufficient documentation

## 2019-07-02 LAB — BASIC METABOLIC PANEL
BUN/Creatinine Ratio: 15 (ref 12–28)
BUN: 10 mg/dL (ref 8–27)
CO2: 22 mmol/L (ref 20–29)
Calcium: 9.6 mg/dL (ref 8.7–10.3)
Chloride: 101 mmol/L (ref 96–106)
Creatinine, Ser: 0.67 mg/dL (ref 0.57–1.00)
GFR calc Af Amer: 107 mL/min/{1.73_m2} (ref >59)
GFR calc non Af Amer: 93 mL/min/{1.73_m2} (ref >59)
Glucose: 100 mg/dL — ABNORMAL HIGH (ref 65–99)
Potassium: 4.2 mmol/L (ref 3.5–5.2)
Sodium: 139 mmol/L (ref 134–144)

## 2019-07-04 ENCOUNTER — Ambulatory Visit
Admission: RE | Admit: 2019-07-04 | Discharge: 2019-07-04 | Disposition: A | Discharge status: Home / Self Care | Payer: PPO | Source: Ambulatory Visit | Attending: Internal Medicine | Admitting: Internal Medicine

## 2019-07-04 DIAGNOSIS — Z1231 Encounter for screening mammogram for malignant neoplasm of breast: Secondary | ICD-10-CM | POA: Diagnosis not present

## 2019-07-06 DIAGNOSIS — M205X1 Other deformities of toe(s) (acquired), right foot: Secondary | ICD-10-CM | POA: Diagnosis not present

## 2019-07-07 ENCOUNTER — Ambulatory Visit (INDEPENDENT_AMBULATORY_CARE_PROVIDER_SITE_OTHER): Payer: PPO | Admitting: Internal Medicine

## 2019-07-07 ENCOUNTER — Encounter: Payer: Self-pay | Admitting: Internal Medicine

## 2019-07-07 DIAGNOSIS — J449 Chronic obstructive pulmonary disease, unspecified: Secondary | ICD-10-CM | POA: Diagnosis not present

## 2019-07-07 DIAGNOSIS — G4719 Other hypersomnia: Secondary | ICD-10-CM

## 2019-07-07 NOTE — Patient Instructions (Addendum)
Obtain  PFT's  Check 6 MWT  Check CXR 2 view  Home sleep study to assess for sleep apnea   Continue Your inhalers as prescribed  Lung cancer Screening

## 2019-07-07 NOTE — Progress Notes (Signed)
Name: Darriel Sinquefield MRN: [id number] DOB: 09-05-1951     I connected with the patient by video/telephone enabled telemedicine visit and verified that I am speaking with the correct person using two identifiers.    I discussed the limitations, risks, security and privacy concerns of performing an evaluation and management service by telemedicine and the availability of in-person appointments. I also discussed with the patient that there may be a patient responsible charge related to this service. The patient expressed understanding and agreed to proceed.  PATIENT AGREES AND CONFIRMS -YES   Other persons participating in the visit and their role in the encounter: Patient, nursing   Patient's location: Home Provider's location: Clinic   I discussed the limitations, risks, security and privacy concerns of performing an evaluation and management service by telephone and the availability of in person appointments. I also discussed with the patient that there may be a patient responsible charge related to this service. The patient expressed understanding and agreed to proceed.  This visit type was conducted due to national recommendations for restrictions regarding the COVID-19 Pandemic (e.g. social distancing).  This format is felt to be most appropriate for this patient at this time.  All issues noted in this document were discussed and addressed.       CONSULTATION DATE: 07/07/2019  REFERRING MD : Dr. Saunders Revel  CHIEF COMPLAINT: snoring  HISTORY OF PRESENT ILLNESS: 68 yo WF was referred to Cardiology for extensive sweating with exertion Cardiac work up in progress  Was sick last year 2 bouts of bronchitis Former smoker  Dx fo COPD many years ago Has chronic SOB and DOE Symbicort daily and Albuterol as needed Seems to be helping  Also has allergic rhinitis +allergy to cats   Fam Hx of Pum Fibrosis  No signs of infection at this time No evidence of COPD exacerbation  Patient  is seen today for problems and issues with sleep related to excessive daytime sleepiness Patient  has been having sleep problems for many years Patient has been having excessive daytime sleepiness for a long time Patient has been having extreme fatigue and tiredness, lack of energy +  very Loud snoring every night + struggling breathe at night and gasps for air   Discussed sleep data and reviewed with patient.  Encouraged proper weight management.  Discussed driving precautions and its relationship with hypersomnolence.  Discussed operating dangerous equipment and its relationship with hypersomnolence.  Discussed sleep hygiene, and benefits of a fixed sleep waked time.  The importance of getting eight or more hours of sleep discussed with patient.  Discussed limiting the use of the computer and television before bedtime.  Decrease naps during the day, so night time sleep will become enhanced.  Limit caffeine, and sleep deprivation.  HTN, stroke, and heart failure are potential risk factors.    EPWORTH SLEEP SCORE 10    PAST MEDICAL HISTORY :   has a past medical history of Abdominal hernia, Anemia, Anxiety, Chronic constipation, Colon polyps, Complication of anesthesia, COPD (chronic obstructive pulmonary disease) ([place]), DDD (degenerative disc disease), cervical, Depression, Endometriosis, Essential hypertension, benign, Fibrocystic breast disease, GERD (gastroesophageal reflux disease), H/O hand surgery (03/17/2017), Hemorrhoids, Hyperlipidemia, Hypothyroidism, Migraine, Nontoxic multinodular goiter, Osteoarthrosis involving, or with mention of more than one site, but not specified as generalized, multiple sites, and Tobacco use.  has a past surgical history that includes Abdominal hysterectomy; Hernia repair; Rotator cuff repair (Left); Carpal tunnel release (Bilateral); Trigger finger release (Left); thumb surgery; Polypectomy; Appendectomy; Tonsillectomy;  Esophagogastroduodenoscopy;  Colonoscopy; Anterior cervical decomp/discectomy fusion; Esophagogastroduodenoscopy (egd) with propofol (N/A, 04/03/2017); and Cheilectomy (Right, 05/27/2019). Prior to Admission medications   Medication Sig Start Date End Date Taking? Authorizing Provider  acetaminophen (TYLENOL) 500 MG tablet Take 500 mg by mouth 2 (two) times a day.    [provider]  albuterol (VENTOLIN HFA) 108 (90 Base) MCG/ACT inhaler Inhale 1-2 puffs into the lungs every 6 (six) hours as needed for wheezing or shortness of breath.    [provider]  aspirin 81 MG chewable tablet Chew 81 mg by mouth daily.    [provider]  budesonide-formoterol (SYMBICORT) 160-4.5 MCG/ACT inhaler Inhale 2 puffs into the lungs 2 (two) times daily.    [provider]  Calcium Carbonate-Vit D-Min (CALTRATE 600+D PLUS MINERALS) 600-800 MG-UNIT CHEW Chew 1 tablet by mouth daily.    [provider]  Cholecalciferol (VITAMIN D) 50 MCG (2000 UT) CAPS Take 2,000 Units by mouth at bedtime.    [provider]  Cyanocobalamin (VITAMIN B-12) 5000 MCG TBDP Take 5,000 mcg by mouth at bedtime.    [provider]  diazepam (VALIUM) 5 MG tablet Take 5 mg by mouth every 6 (six) hours as needed for anxiety.    [provider]  esomeprazole (NEXIUM) 40 MG capsule Take 40 mg by mouth daily at 12 noon.    [provider]  estradiol (ESTRACE) 0.5 MG tablet Take 0.5 mg by mouth at bedtime. 04/16/19   [provider]  gabapentin (NEURONTIN) 300 MG capsule Take 300-600 mg by mouth See admin instructions. Take 300mg  in the AM, and 600mg  at bedtime. 12/26/13   [provider]  hydrochlorothiazide (HYDRODIURIL) 25 MG tablet Take 25 mg by mouth daily.  02/27/15 07/01/19  [provider]  levothyroxine (SYNTHROID) 100 MCG tablet Take 100 mcg by mouth daily before breakfast. 04/16/19   [provider]  loratadine (CLARITIN REDITABS) 10 MG dissolvable tablet Take 10  mg by mouth daily as needed for allergies.    [provider]  losartan (COZAAR) 50 MG tablet Take 50 mg by mouth daily.    [provider]  montelukast (SINGULAIR) 10 MG tablet Take 10 mg by mouth at bedtime. 04/08/19   [provider]  Multiple Vitamins-Minerals (EYE VITAMINS PO) Take 1 tablet by mouth daily.     [provider]  oxyCODONE (ROXICODONE) 5 MG immediate release tablet Take 1 tablet (5 mg total) by mouth every 4 (four) hours as needed. 05/27/19 05/26/20  Sharlotte Alamo, DPM  pravastatin (PRAVACHOL) 40 MG tablet Take 40 mg by mouth every evening.     [provider]  Propylene Glycol (SYSTANE COMPLETE OP) Place 1 drop into both eyes 2 (two) times daily as needed (dry eyes).    [provider]  sodium chloride (MURO 128) 5 % ophthalmic solution Place 1 drop into the left eye 2 (two) times a day.    [provider]  traMADol (ULTRAM) 50 MG tablet Take 50 mg by mouth every 6 (six) hours as needed for moderate pain.     [provider]   Allergies  Allergen Reactions  . Macrodantin [Nitrofurantoin Macrocrystal] Hives  . Penicillins Other (See Comments)    Did it involve swelling of the face/tongue/throat, SOB, or low BP? Unknown Did it involve sudden or severe rash/hives, skin peeling, or any reaction on the inside of your mouth or nose? Unknown Did you need to seek medical attention at a hospital or doctor's  office? Unknown When did it last happen? as a child If all above answers are "NO", may proceed with cephalosporin use.     FAMILY HISTORY:  family history includes COPD in her father; Clotting disorder in her mother; Colon polyps in her father; Dementia in her mother; Diabetes in her mother; Gallbladder disease in her father and mother; HIV in her brother; Heart disease in her mother; Hypertension in her father and mother; Osteoarthritis in her father, mother, and sister; Prostate cancer in her father; Thyroid  disease in her sister. SOCIAL HISTORY:  reports that she quit smoking about a year ago. Her smoking use included cigarettes. She has a 33.75 pack-year smoking history. She has never used smokeless tobacco. She reports current alcohol use. She reports that she does not use drugs.  REVIEW OF SYSTEMS:   Constitutional:+sweating  Negative for fever, chills, weight loss, malaise/fatigue and diaphoresis.  HENT: Negative for hearing loss, ear pain, nosebleeds, congestion, sore throat, neck pain, tinnitus and ear discharge.   Eyes: Negative for blurred vision, double vision, photophobia, pain, discharge and redness.  Respiratory: Negative for cough, hemoptysis, sputum production, shortness of breath, wheezing and stridor.   Cardiovascular: Negative for chest pain, palpitations, orthopnea, claudication, leg swelling and PND.  Gastrointestinal: Negative for heartburn, nausea, vomiting, abdominal pain, diarrhea, constipation, blood in stool and melena.  Genitourinary: Negative for dysuria, urgency, frequency, hematuria and flank pain.  Musculoskeletal: Negative for myalgias, back pain, joint pain and falls.  Skin: Negative for itching and rash.  Neurological: Negative for dizziness, tingling, tremors, sensory change, speech change, focal weakness, seizures, loss of consciousness, weakness and headaches.  Endo/Heme/Allergies: Negative for environmental allergies and polydipsia. Does not bruise/bleed easily.      ASSESSMENT AND PLAN SYNOPSIS  68 year old pleasant white female for assessment today for heavy sweating with a diagnosis of COPD with a history of extensive smoking history with signs symptoms of snoring excessive daytime sleepiness which strongly suggest underlying sleep apnea   Chronic shortness of breath and dyspnea on exertion most likely related to deconditioned state and COPD I will need to obtain pulmonary function testing for assessment of her lung function I will also need to  obtain 6-minute walk test to assess for exertional hypoxia Family history of pulmonary fibrosis will obtain 2 view chest x-ray as well   COPD Based on clinical history and former smoker Continue Symbicort as prescribed Albuterol as needed  Signs symptoms of OSA with snoring excessive daytime sleepiness and difficulty breathing at night Patient will need home sleep study to assess for sleep apnea   Former smoker at age 59 1 pack a day for 45 years Patient seems to be a candidate for lung cancer screening referral    COVID-19 EDUCATION: The signs and symptoms of COVID-19 were discussed with the patient and how to seek care for testing.  The importance of social distancing was discussed today. Hand Washing Techniques and avoid touching face was advised.  MEDICATION ADJUSTMENTS/LABS AND TESTS ORDERED: Obtain  PFT's Check 6 MWT Check CXR 2 view Home sleep study to assess for sleep apnea Continue You inhalers as prescribed Lung cancer Screening    CURRENT MEDICATIONS REVIEWED AT LENGTH WITH PATIENT TODAY   Patient satisfied with Plan of action and management. All questions answered  Follow up in 3 months   Shyteria Lewis Patricia Pesa, M.D.  [facility]  Medical Director [place] Director [facility] Cardio-Pulmonary Department

## 2019-07-12 ENCOUNTER — Telehealth (HOSPITAL_COMMUNITY): Payer: Self-pay | Admitting: Emergency Medicine

## 2019-07-12 NOTE — Telephone Encounter (Signed)
Reaching out to patient to offer assistance regarding upcoming cardiac imaging study; pt verbalizes understanding of appt date/time, parking situation and where to check in, pre-test NPO status and medications ordered, and verified current allergies; name and call back number provided for further questions should they arise Victoria Euceda RN Navigator Cardiac Imaging [facility] [phone] office [phone] cell 

## 2019-07-13 ENCOUNTER — Ambulatory Visit: Payer: PPO

## 2019-07-13 ENCOUNTER — Ambulatory Visit: Payer: PPO | Admitting: *Deleted

## 2019-07-13 ENCOUNTER — Other Ambulatory Visit: Payer: Self-pay

## 2019-07-13 ENCOUNTER — Ambulatory Visit
Admission: RE | Admit: 2019-07-13 | Discharge: 2019-07-13 | Disposition: A | Discharge status: Home / Self Care | Payer: PPO | Source: Ambulatory Visit | Attending: Internal Medicine | Admitting: Internal Medicine

## 2019-07-13 DIAGNOSIS — R0609 Other forms of dyspnea: Secondary | ICD-10-CM | POA: Insufficient documentation

## 2019-07-13 DIAGNOSIS — I2510 Atherosclerotic heart disease of native coronary artery without angina pectoris: Secondary | ICD-10-CM | POA: Diagnosis not present

## 2019-07-13 DIAGNOSIS — R0789 Other chest pain: Secondary | ICD-10-CM | POA: Diagnosis not present

## 2019-07-13 DIAGNOSIS — I700 Atherosclerosis of aorta: Secondary | ICD-10-CM | POA: Diagnosis not present

## 2019-07-13 MED ORDER — IOHEXOL 350 MG/ML SOLN
100.0000 mL | Freq: Once | INTRAVENOUS | Status: AC | PRN
  Administered 2019-07-13: 100 mL via INTRAVENOUS

## 2019-07-13 MED ORDER — NITROGLYCERIN 0.4 MG SL SUBL
0.8000 mg | SUBLINGUAL_TABLET | Freq: Once | SUBLINGUAL | Status: AC
  Administered 2019-07-13: 0.8 mg via SUBLINGUAL

## 2019-07-13 NOTE — Progress Notes (Signed)
Patient ID: Janet Osborne, female   DOB: February 25, 1951, 68 y.o.   MRN: [id number]  Pt denies dizziness/lightheadedness; pt given cup of coffee; ambulatory with steady gait noted

## 2019-07-14 ENCOUNTER — Ambulatory Visit
Admission: RE | Admit: 2019-07-14 | Discharge: 2019-07-14 | Disposition: A | Discharge status: Home / Self Care | Payer: PPO | Source: Ambulatory Visit | Attending: Internal Medicine | Admitting: Internal Medicine

## 2019-07-14 ENCOUNTER — Ambulatory Visit: Payer: PPO | Admitting: Internal Medicine

## 2019-07-14 DIAGNOSIS — R0609 Other forms of dyspnea: Secondary | ICD-10-CM

## 2019-07-14 DIAGNOSIS — I700 Atherosclerosis of aorta: Secondary | ICD-10-CM | POA: Diagnosis not present

## 2019-07-14 DIAGNOSIS — R0602 Shortness of breath: Secondary | ICD-10-CM | POA: Diagnosis not present

## 2019-07-14 DIAGNOSIS — J449 Chronic obstructive pulmonary disease, unspecified: Secondary | ICD-10-CM | POA: Diagnosis not present

## 2019-07-14 NOTE — Progress Notes (Signed)
SIX MIN WALK 07/14/2019  Medications Tylenol 500mg , aspirin 81mg , symbicort 160, gabapentin 300mg , hydrodiuril 25mg , synthroid 136mcg and losartan 50mg  all taken at 7:30a  Supplimental Oxygen during Test? (L/min) No  Laps 16  Partial Lap (in Meters) 0  Baseline BP (sitting) 124/70  Baseline Heartrate 66  Baseline Dyspnea (Borg Scale) 0  Baseline Fatigue (Borg Scale) 0.5  Baseline SPO2 95  BP (sitting) 136/80  Heartrate 101  Dyspnea (Borg Scale) 0.5  Fatigue (Borg Scale) 1  SPO2 97  BP (sitting) 132/76  Heartrate 69  SPO2 97  Stopped or Paused before Six Minutes Yes  Distance Completed 544  Tech Comments: pt completed test at moderate pace with no complaints. steady gait.

## 2019-07-15 ENCOUNTER — Telehealth: Payer: Self-pay | Admitting: *Deleted

## 2019-07-15 DIAGNOSIS — Z122 Encounter for screening for malignant neoplasm of respiratory organs: Secondary | ICD-10-CM

## 2019-07-15 DIAGNOSIS — Z87891 Personal history of nicotine dependence: Secondary | ICD-10-CM

## 2019-07-15 DIAGNOSIS — H16002 Unspecified corneal ulcer, left eye: Secondary | ICD-10-CM | POA: Diagnosis not present

## 2019-07-15 NOTE — Telephone Encounter (Signed)
Received referral for initial lung cancer screening scan. Contacted patient and obtained smoking history,(former, quit 07/08/18, 33.75 pack year) as well as answering questions related to screening process. Patient denies signs of lung cancer such as weight loss or hemoptysis. Patient denies comorbidity that would prevent curative treatment if lung cancer were found. Patient is scheduled for shared decision making visit and CT scan on 07/21/19 at 145pm.

## 2019-07-21 ENCOUNTER — Inpatient Hospital Stay: Payer: PPO | Attending: Oncology | Admitting: Nurse Practitioner

## 2019-07-21 ENCOUNTER — Other Ambulatory Visit: Payer: Self-pay

## 2019-07-21 ENCOUNTER — Telehealth: Payer: Self-pay | Admitting: *Deleted

## 2019-07-21 ENCOUNTER — Ambulatory Visit
Admission: RE | Admit: 2019-07-21 | Discharge: 2019-07-21 | Disposition: A | Discharge status: Home / Self Care | Payer: PPO | Source: Ambulatory Visit | Attending: Oncology | Admitting: Oncology

## 2019-07-21 DIAGNOSIS — R0789 Other chest pain: Secondary | ICD-10-CM

## 2019-07-21 DIAGNOSIS — Z122 Encounter for screening for malignant neoplasm of respiratory organs: Secondary | ICD-10-CM | POA: Diagnosis not present

## 2019-07-21 DIAGNOSIS — Z87891 Personal history of nicotine dependence: Secondary | ICD-10-CM | POA: Insufficient documentation

## 2019-07-21 DIAGNOSIS — J439 Emphysema, unspecified: Secondary | ICD-10-CM | POA: Diagnosis not present

## 2019-07-21 DIAGNOSIS — I10 Essential (primary) hypertension: Secondary | ICD-10-CM

## 2019-07-21 DIAGNOSIS — Z13220 Encounter for screening for lipoid disorders: Secondary | ICD-10-CM

## 2019-07-21 MED ORDER — ROSUVASTATIN CALCIUM 20 MG PO TABS: 20.0000 mg | ORAL_TABLET | Freq: Every day | ORAL | 3 refills | Status: DC

## 2019-07-21 NOTE — Progress Notes (Signed)
Virtual Visit via Video Enabled Telemedicine Note   I connected with Cherylyn Sundby Penick on 07/21/19 at 1:30 PM EST by video enabled telemedicine visit and verified that I am speaking with the correct person using two identifiers.   I discussed the limitations, risks, security and privacy concerns of performing an evaluation and management service by telemedicine and the availability of in-person appointments. I also discussed with the patient that there may be a patient responsible charge related to this service. The patient expressed understanding and agreed to proceed.   Other persons participating in the visit and their role in the encounter: Burgess Estelle, RN- checking in patient & navigation  Patient's location: clinic  Provider's location: home  Chief Complaint: Low Dose CT Screening  Patient agreed to evaluation by telemedicine to discuss shared decision making for consideration of low dose CT lung cancer screening.    In accordance with CMS guidelines, patient has met eligibility criteria including age, absence of signs or symptoms of lung cancer.  Social History   Tobacco Use  . Smoking status: Former Smoker    Packs/day: 0.75    Years: 45.00    Pack years: 33.75    Types: Cigarettes    Quit date: 07/2018    Years since quitting: 1.0  . Smokeless tobacco: Never Used  . Tobacco comment: Uses nicotine patch  Substance Use Topics  . Alcohol use: Yes    Comment: occassional     A shared decision-making session was conducted prior to the performance of CT scan. This includes one or more decision aids, includes benefits and harms of screening, follow-up diagnostic testing, over-diagnosis, false positive rate, and total radiation exposure.   Counseling on the importance of adherence to annual lung cancer LDCT screening, impact of co-morbidities, and ability or willingness to undergo diagnosis and treatment is imperative for compliance of the program.   Counseling on the  importance of continued smoking cessation for former smokers; the importance of smoking cessation for current smokers, and information about tobacco cessation interventions have been given to patient including [place] and [employer] [place] programs.   Written order for lung cancer screening with LDCT has been given to the patient and any and all questions have been answered to the best of my abilities.    Yearly follow up will be coordinated by Burgess Estelle, Thoracic Navigator.  I discussed the assessment and treatment plan with the patient. The patient was provided an opportunity to ask questions and all were answered. The patient agreed with the plan and demonstrated an understanding of the instructions.   The patient was advised to call back or seek an in-person evaluation if the symptoms worsen or if the condition fails to improve as anticipated.   I provided 15 minutes of face-to-face video visit time during this encounter, and > 50% was spent counseling as documented under my assessment & plan.   Beckey Rutter, DNP, AGNP-C [place] [facility] [phone] (work cell) [phone] (office)

## 2019-07-21 NOTE — Telephone Encounter (Signed)
Results released to My Chart. Results called to pt. Pt verbalized understanding. She verbalized understanding to stop pravastatin and start rosuvastatin 20 mg daily. She is aware for be fasting and go to the [facility] mid-end of November for repeat labs.  Rx sent to pharmacy.  Med list updated.

## 2019-07-21 NOTE — Telephone Encounter (Signed)
-----   Message from [name], MD sent at 07/15/2019  7:12 AM EDT ----- Please let Ms. Hinnenkamp know that there is moderate narrowing in one of her heart arteries (LAD), which is not significant enough to explain her symptoms.  It is likely that her worsening shortness of breath is due to underlying lung issues.  However, given moderate coronary artery disease, I recommend escalation of her statin therapy from pravastatin to rosuvastatin 20 mg daily (goal LDL < 70).  We will need to repeat a fasting lipid panel and ALT in ~3 months.  I recommend that she continue the remainder of her medications, including aspirin 81 mg daily.

## 2019-07-22 ENCOUNTER — Telehealth: Payer: Self-pay | Admitting: *Deleted

## 2019-07-22 NOTE — Telephone Encounter (Signed)
Notified patient of LDCT lung cancer screening program results with recommendation for 12 month follow up imaging. Also notified of incidental findings noted below and is encouraged to discuss further with PCP who will receive a copy of this note and/or the CT report. Patient verbalizes understanding.   IMPRESSION: 1. Lung-RADS 2, benign appearance or behavior. Continue annual screening with low-dose chest CT without contrast in 12 months. 2.  Emphysema. (ICD10-J43.9) 3.  Aortic Atherosclerois (ICD10-170.0)

## 2019-08-01 ENCOUNTER — Other Ambulatory Visit: Payer: Self-pay

## 2019-08-01 ENCOUNTER — Ambulatory Visit: Payer: PPO

## 2019-08-01 DIAGNOSIS — G4719 Other hypersomnia: Secondary | ICD-10-CM

## 2019-08-01 DIAGNOSIS — G4733 Obstructive sleep apnea (adult) (pediatric): Secondary | ICD-10-CM | POA: Diagnosis not present

## 2019-08-01 NOTE — Telephone Encounter (Signed)
Patient returning call.

## 2019-08-01 NOTE — Telephone Encounter (Signed)
Unless she has worsening symptoms or question/concerns that she wishes to discuss in the office, I think it is fine to push back her f/u visit to ~3 months from her last visit.  Janet Bush, MD [facility] Pager: [phone]

## 2019-08-01 NOTE — Telephone Encounter (Signed)
Attempted to call the patient. No answer- I left a message to please call back.  

## 2019-08-01 NOTE — Telephone Encounter (Signed)
Please advise patient if she needs to keep her appt with Christell Faith, PA on 9/4

## 2019-08-01 NOTE — Telephone Encounter (Signed)
To Dr. Saunders Revel to review his thoughts on her follow up appointment on 9/4 with Thurmond Butts, [place].

## 2019-08-02 ENCOUNTER — Telehealth: Payer: Self-pay | Admitting: Internal Medicine

## 2019-08-02 DIAGNOSIS — G4733 Obstructive sleep apnea (adult) (pediatric): Secondary | ICD-10-CM | POA: Diagnosis not present

## 2019-08-02 NOTE — Telephone Encounter (Signed)
HST performed on 08/01/2019- confirmed moderate OSA with AHI of 18. Recommend auto cpap 5-20cm h2O.  Pt is aware of results and voiced her understanding.  Pt would like to try oral appliance vs cpap.   DK please advise if okay to place referral for oral device? Thanks

## 2019-08-02 NOTE — Telephone Encounter (Addendum)
Spoke with patient and she is agreeable to reschedule. Appointment rescheduled for December and patient verbalized understanding. She denies any symptoms or concerns at this time.

## 2019-08-03 NOTE — Telephone Encounter (Signed)
Called and spoke to pt. Pt stated that she would like to think about oral device and cpap. pt will give our office a call back with an update.

## 2019-08-03 NOTE — Telephone Encounter (Signed)
Ok for oral device...but will need another home sleep study to see if device works

## 2019-08-05 DIAGNOSIS — M25512 Pain in left shoulder: Secondary | ICD-10-CM | POA: Diagnosis not present

## 2019-08-05 DIAGNOSIS — M7522 Bicipital tendinitis, left shoulder: Secondary | ICD-10-CM | POA: Diagnosis not present

## 2019-08-12 ENCOUNTER — Ambulatory Visit: Payer: PPO | Admitting: Physician Assistant

## 2019-08-12 NOTE — Telephone Encounter (Signed)
Left message for update 

## 2019-08-17 ENCOUNTER — Telehealth: Payer: Self-pay | Admitting: Internal Medicine

## 2019-08-17 NOTE — Telephone Encounter (Signed)
Called and spoke to pt, who stated that she would like to discuss this with PCP.  Pt will call back with update.

## 2019-08-17 NOTE — Telephone Encounter (Signed)
Sleep study has been faxed to provided fax number.  Pt is aware.

## 2019-08-25 ENCOUNTER — Telehealth: Payer: Self-pay | Admitting: Internal Medicine

## 2019-08-25 DIAGNOSIS — G4733 Obstructive sleep apnea (adult) (pediatric): Secondary | ICD-10-CM

## 2019-08-25 NOTE — Telephone Encounter (Signed)
Spoke to pt, who stated that she would like to proceed with oral device.  Pt is aware that HST will need to be repeated with oral device.  Pt would like to call her dentist to see if they offer oral device.  She will call back with update.

## 2019-08-25 NOTE — Telephone Encounter (Signed)
Left message for pt for update.

## 2019-08-26 NOTE — Telephone Encounter (Signed)
Please see 08/25/2019 phone note.

## 2019-09-01 NOTE — Telephone Encounter (Signed)
Lm for update.  

## 2019-09-09 NOTE — Telephone Encounter (Signed)
Called and spoke to pt for sleep study.  Pt stated that she would like to proceed with oral device.  Order has been placed for oral device and HST with oral device has been ordered. Nothing further is needed.

## 2019-09-12 ENCOUNTER — Other Ambulatory Visit: Payer: Self-pay

## 2019-09-12 ENCOUNTER — Telehealth: Payer: Self-pay | Admitting: Internal Medicine

## 2019-09-12 NOTE — Telephone Encounter (Signed)
Pt is aware of date/time of covid test.   

## 2019-09-13 ENCOUNTER — Other Ambulatory Visit
Admission: RE | Admit: 2019-09-13 | Discharge: 2019-09-13 | Disposition: A | Discharge status: Home / Self Care | Payer: PPO | Source: Ambulatory Visit | Attending: Internal Medicine | Admitting: Internal Medicine

## 2019-09-13 DIAGNOSIS — Z20828 Contact with and (suspected) exposure to other viral communicable diseases: Secondary | ICD-10-CM | POA: Insufficient documentation

## 2019-09-13 DIAGNOSIS — Z01812 Encounter for preprocedural laboratory examination: Secondary | ICD-10-CM | POA: Insufficient documentation

## 2019-09-13 LAB — SARS CORONAVIRUS 2 (TAT 6-24 HRS): SARS Coronavirus 2: NEGATIVE

## 2019-09-14 ENCOUNTER — Ambulatory Visit: Payer: PPO | Attending: Internal Medicine

## 2019-09-14 ENCOUNTER — Other Ambulatory Visit: Payer: Self-pay

## 2019-09-14 DIAGNOSIS — J449 Chronic obstructive pulmonary disease, unspecified: Secondary | ICD-10-CM | POA: Insufficient documentation

## 2019-09-19 ENCOUNTER — Ambulatory Visit: Payer: PPO | Admitting: Internal Medicine

## 2019-09-20 DIAGNOSIS — E782 Mixed hyperlipidemia: Secondary | ICD-10-CM | POA: Diagnosis not present

## 2019-09-20 DIAGNOSIS — R739 Hyperglycemia, unspecified: Secondary | ICD-10-CM | POA: Diagnosis not present

## 2019-09-20 DIAGNOSIS — Z23 Encounter for immunization: Secondary | ICD-10-CM | POA: Diagnosis not present

## 2019-09-20 DIAGNOSIS — Z0000 Encounter for general adult medical examination without abnormal findings: Secondary | ICD-10-CM | POA: Diagnosis not present

## 2019-09-20 DIAGNOSIS — J431 Panlobular emphysema: Secondary | ICD-10-CM | POA: Diagnosis not present

## 2019-09-20 DIAGNOSIS — I10 Essential (primary) hypertension: Secondary | ICD-10-CM | POA: Diagnosis not present

## 2019-09-20 DIAGNOSIS — E039 Hypothyroidism, unspecified: Secondary | ICD-10-CM | POA: Diagnosis not present

## 2019-09-22 ENCOUNTER — Other Ambulatory Visit: Payer: Self-pay

## 2019-09-22 ENCOUNTER — Encounter: Payer: Self-pay | Admitting: Internal Medicine

## 2019-09-22 ENCOUNTER — Ambulatory Visit (INDEPENDENT_AMBULATORY_CARE_PROVIDER_SITE_OTHER): Payer: PPO | Admitting: Internal Medicine

## 2019-09-22 DIAGNOSIS — J449 Chronic obstructive pulmonary disease, unspecified: Secondary | ICD-10-CM

## 2019-09-22 DIAGNOSIS — G4733 Obstructive sleep apnea (adult) (pediatric): Secondary | ICD-10-CM | POA: Diagnosis not present

## 2019-09-22 NOTE — Patient Instructions (Signed)
Continue inhalers as prescribed Start AUTOCPAP therapy 5-15 cm h20 Follow up lung cnace rscreening program Wean off of Symbicort and assess respiratory status

## 2019-09-22 NOTE — Progress Notes (Signed)
Name: Janet Osborne MRN: [id number] DOB: Apr 20, 1951      I connected with the patient by telephone enabled telemedicine visit and verified that I am speaking with the correct person using two identifiers.    I discussed the limitations, risks, security and privacy concerns of performing an evaluation and management service by telemedicine and the availability of in-person appointments. I also discussed with the patient that there may be a patient responsible charge related to this service. The patient expressed understanding and agreed to proceed.  PATIENT AGREES AND CONFIRMS -YES   Other persons participating in the visit and their role in the encounter: Patient, nursing  This visit type was conducted due to national recommendations for restrictions regarding the COVID-19 Pandemic (e.g. social distancing).  This format is felt to be most appropriate for this patient at this time.  All issues noted in this document were discussed and addressed.      CONSULTATION DATE: 09/22/2019 REFERRING MD : Dr. Saunders Revel   CHIEF COMPLAINT: excessive daytime sleepiness Dx of OSA   HISTORY OF PRESENT ILLNESS: The following results were reviewed with the patient-  CXR 2 View  The CXR was Independently Reviewed By Me Today CXR reviewed-WNL, no pneumonia or fluids   CT chest 07/2019 Independently Reviewed By Me Today CT chest-WNL, no masses, very small lung nodule Need to follow up every year   PFT  09/2019 Moderate restrictive lung disease  MILD FORM OF COPD  Has chronic SOB and DOE Uses Symbicort daily and albuterol as needed   Also has allergic rhinitis +allergy to cats   Sleep Study 07/2019 AHI 19 results reviewed with patient   No  exacerbation at this time No evidence of heart failure at this time No evidence or signs of infection at this time No respiratory distress No fevers, chills, nausea, vomiting, diarrhea No evidence of lower extremity edema No evidence  hemoptysis      PAST MEDICAL HISTORY :   has a past medical history of Abdominal hernia, Anemia, Anxiety, Chronic constipation, Colon polyps, Complication of anesthesia, COPD (chronic obstructive pulmonary disease) ([place]), DDD (degenerative disc disease), cervical, Depression, Endometriosis, Essential hypertension, benign, Fibrocystic breast disease, GERD (gastroesophageal reflux disease), H/O hand surgery (03/17/2017), Hemorrhoids, Hyperlipidemia, Hypothyroidism, Migraine, Nontoxic multinodular goiter, Osteoarthrosis involving, or with mention of more than one site, but not specified as generalized, multiple sites, and Tobacco use.  has a past surgical history that includes Abdominal hysterectomy; Hernia repair; Rotator cuff repair (Left); Carpal tunnel release (Bilateral); Trigger finger release (Left); thumb surgery; Polypectomy; Appendectomy; Tonsillectomy; Esophagogastroduodenoscopy; Colonoscopy; Anterior cervical decomp/discectomy fusion; Esophagogastroduodenoscopy (egd) with propofol (N/A, 04/03/2017); and Cheilectomy (Right, 05/27/2019). Prior to Admission medications   Medication Sig Start Date End Date Taking? Authorizing Provider  acetaminophen (TYLENOL) 500 MG tablet Take 500 mg by mouth 2 (two) times a day.    [provider]  albuterol (VENTOLIN HFA) 108 (90 Base) MCG/ACT inhaler Inhale 1-2 puffs into the lungs every 6 (six) hours as needed for wheezing or shortness of breath.    [provider]  aspirin 81 MG chewable tablet Chew 81 mg by mouth daily.    [provider]  budesonide-formoterol (SYMBICORT) 160-4.5 MCG/ACT inhaler Inhale 2 puffs into the lungs 2 (two) times daily.    [provider]  Calcium Carbonate-Vit D-Min (CALTRATE 600+D PLUS MINERALS) 600-800 MG-UNIT CHEW Chew 1 tablet by mouth daily.    [provider]  Cholecalciferol (VITAMIN D) 50 MCG (2000 UT) CAPS Take 2,000 Units  by mouth at bedtime.    [provider]   Cyanocobalamin (VITAMIN B-12) 5000 MCG TBDP Take 5,000 mcg by mouth at bedtime.    [provider]  diazepam (VALIUM) 5 MG tablet Take 5 mg by mouth every 6 (six) hours as needed for anxiety.    [provider]  esomeprazole (NEXIUM) 40 MG capsule Take 40 mg by mouth daily at 12 noon.    [provider]  estradiol (ESTRACE) 0.5 MG tablet Take 0.5 mg by mouth at bedtime. 04/16/19   [provider]  gabapentin (NEURONTIN) 300 MG capsule Take 300-600 mg by mouth See admin instructions. Take 300mg  in the AM, and 600mg  at bedtime. 12/26/13   [provider]  hydrochlorothiazide (HYDRODIURIL) 25 MG tablet Take 25 mg by mouth daily.  02/27/15 07/01/19  [provider]  levothyroxine (SYNTHROID) 100 MCG tablet Take 100 mcg by mouth daily before breakfast. 04/16/19   [provider]  loratadine (CLARITIN REDITABS) 10 MG dissolvable tablet Take 10 mg by mouth daily as needed for allergies.    [provider]  losartan (COZAAR) 50 MG tablet Take 50 mg by mouth daily.    [provider]  montelukast (SINGULAIR) 10 MG tablet Take 10 mg by mouth at bedtime. 04/08/19   [provider]  Multiple Vitamins-Minerals (EYE VITAMINS PO) Take 1 tablet by mouth daily.     [provider]  oxyCODONE (ROXICODONE) 5 MG immediate release tablet Take 1 tablet (5 mg total) by mouth every 4 (four) hours as needed. 05/27/19 05/26/20  Sharlotte Alamo, DPM  pravastatin (PRAVACHOL) 40 MG tablet Take 40 mg by mouth every evening.     [provider]  Propylene Glycol (SYSTANE COMPLETE OP) Place 1 drop into both eyes 2 (two) times daily as needed (dry eyes).    [provider]  sodium chloride (MURO 128) 5 % ophthalmic solution Place 1 drop into the left eye 2 (two) times a day.    [provider]  traMADol (ULTRAM) 50 MG tablet Take 50 mg by mouth every 6 (six) hours as needed for moderate pain.     [provider]    Allergies  Allergen Reactions  . Macrodantin [Nitrofurantoin Macrocrystal] Hives  . Penicillins Other (See Comments)    Did it involve swelling of the face/tongue/throat, SOB, or low BP? Unknown Did it involve sudden or severe rash/hives, skin peeling, or any reaction on the inside of your mouth or nose? Unknown Did you need to seek medical attention at a hospital or doctor's office? Unknown When did it last happen? as a child If all above answers are "NO", may proceed with cephalosporin use.     FAMILY HISTORY:  family history includes COPD in her father; Clotting disorder in her mother; Colon polyps in her father; Dementia in her mother; Diabetes in her mother; Gallbladder disease in her father and mother; HIV in her brother; Heart disease in her mother; Hypertension in her father and mother; Osteoarthritis in her father, mother, and sister; Prostate cancer in her father; Thyroid disease in her sister. SOCIAL HISTORY:  reports that she quit smoking about 14 months ago. Her smoking use included cigarettes. She has a 33.75 pack-year smoking history. She has never used smokeless tobacco. She reports current alcohol use. She reports that she does not use drugs.   Review of Systems:  Gen:  Denies  fever, sweats, chills weight loss  HEENT: Denies blurred vision, double vision, ear pain, eye pain,  hearing loss, nose bleeds, sore throat Cardiac:  No dizziness, chest pain or heaviness, chest tightness,edema, No JVD Resp:   No cough, -sputum production, -shortness of breath,-wheezing, -hemoptysis,  Gi: Denies swallowing difficulty, stomach pain, nausea or vomiting, diarrhea, constipation, bowel incontinence Gu:  Denies bladder incontinence, burning urine Ext:   Denies Joint pain, stiffness or swelling Skin: Denies  skin rash, easy bruising or bleeding or hives Endoc:  Denies polyuria, polydipsia , polyphagia or weight change Psych:   Denies depression, insomnia or hallucinations  Other:   All other systems negative       ASSESSMENT AND PLAN SYNOPSIS  COPD MILD Continue inhalers as prescribed Will try to wean off of inhalers and assess resp status   Moderate/Severe OSA Will need to Start AUTOCPAP therapy 5-15 cm h20  Former Smoker 1 PPD for 45 years Lung cancer screening program Follow up CT chest annualy     COVID-19 EDUCATION: The signs and symptoms of COVID-19 were discussed with the patient and how to seek care for testing.  The importance of social distancing was discussed today. Hand Washing Techniques and avoid touching face was advised.  MEDICATION ADJUSTMENTS/LABS AND TESTS ORDERED: Continue inhalers as prescribed Start AUTOCPAP therapy Follow up lung cancer screening program Wean off of Symbicort and assess respiratory status   CURRENT MEDICATIONS REVIEWED AT LENGTH WITH PATIENT TODAY   Patient satisfied with Plan of action and management. All questions answered  Follow up in 3 months  Total time spent 28 mins  Maretta Bees Patricia Pesa, M.D.  [facility]  Medical Director [place] Director [facility] Cardio-Pulmonary Department

## 2019-09-22 NOTE — Addendum Note (Signed)
Addended by: Vivia Ewing on: 09/22/2019 12:28 PM   Modules accepted: Orders

## 2019-09-28 DIAGNOSIS — G4733 Obstructive sleep apnea (adult) (pediatric): Secondary | ICD-10-CM | POA: Diagnosis not present

## 2019-10-10 DIAGNOSIS — R739 Hyperglycemia, unspecified: Secondary | ICD-10-CM | POA: Diagnosis not present

## 2019-10-10 DIAGNOSIS — R8290 Unspecified abnormal findings in urine: Secondary | ICD-10-CM | POA: Diagnosis not present

## 2019-10-10 DIAGNOSIS — E782 Mixed hyperlipidemia: Secondary | ICD-10-CM | POA: Diagnosis not present

## 2019-10-10 DIAGNOSIS — I10 Essential (primary) hypertension: Secondary | ICD-10-CM | POA: Diagnosis not present

## 2019-10-10 DIAGNOSIS — Z79899 Other long term (current) drug therapy: Secondary | ICD-10-CM | POA: Diagnosis not present

## 2019-10-10 DIAGNOSIS — E039 Hypothyroidism, unspecified: Secondary | ICD-10-CM | POA: Diagnosis not present

## 2019-10-17 DIAGNOSIS — E782 Mixed hyperlipidemia: Secondary | ICD-10-CM | POA: Diagnosis not present

## 2019-10-17 DIAGNOSIS — F411 Generalized anxiety disorder: Secondary | ICD-10-CM | POA: Diagnosis not present

## 2019-10-17 DIAGNOSIS — Z79899 Other long term (current) drug therapy: Secondary | ICD-10-CM | POA: Diagnosis not present

## 2019-10-17 DIAGNOSIS — E039 Hypothyroidism, unspecified: Secondary | ICD-10-CM | POA: Diagnosis not present

## 2019-10-17 DIAGNOSIS — F419 Anxiety disorder, unspecified: Secondary | ICD-10-CM | POA: Diagnosis not present

## 2019-10-17 DIAGNOSIS — I10 Essential (primary) hypertension: Secondary | ICD-10-CM | POA: Diagnosis not present

## 2019-10-17 DIAGNOSIS — R739 Hyperglycemia, unspecified: Secondary | ICD-10-CM | POA: Diagnosis not present

## 2019-10-17 DIAGNOSIS — F329 Major depressive disorder, single episode, unspecified: Secondary | ICD-10-CM | POA: Diagnosis not present

## 2019-10-29 DIAGNOSIS — G4733 Obstructive sleep apnea (adult) (pediatric): Secondary | ICD-10-CM | POA: Diagnosis not present

## 2019-11-09 ENCOUNTER — Ambulatory Visit: Payer: PPO | Admitting: Internal Medicine

## 2019-11-15 DIAGNOSIS — M7671 Peroneal tendinitis, right leg: Secondary | ICD-10-CM | POA: Diagnosis not present

## 2019-11-15 DIAGNOSIS — L600 Ingrowing nail: Secondary | ICD-10-CM | POA: Diagnosis not present

## 2019-11-15 DIAGNOSIS — M205X1 Other deformities of toe(s) (acquired), right foot: Secondary | ICD-10-CM | POA: Diagnosis not present

## 2019-11-28 DIAGNOSIS — G4733 Obstructive sleep apnea (adult) (pediatric): Secondary | ICD-10-CM | POA: Diagnosis not present

## 2019-12-21 NOTE — Progress Notes (Signed)
Virtual Visit via Telephone Note   This visit type was conducted due to national recommendations for restrictions regarding the COVID-19 Pandemic (e.g. social distancing) in an effort to limit this patient's exposure and mitigate transmission in our community.  Due to her co-morbid illnesses, this patient is at least at moderate risk for complications without adequate follow up.  This format is felt to be most appropriate for this patient at this time.  The patient did not have access to video technology/had technical difficulties with video requiring transitioning to audio format only (telephone).  All issues noted in this document were discussed and addressed.  No physical exam could be performed with this format.  Please refer to the patient's chart for her  consent to telehealth for [facility].   Date:  12/23/2019   ID:  Janet Osborne, Janet Osborne Apr 05, 1951, MRN [id number]  Patient Location: Home Provider Location: Office  PCP:  Idelle Crouch, MD  Cardiologist:  [name], MD Electrophysiologist:  None   Evaluation Performed:  Follow-Up Visit  Chief Complaint: Shortness of breath  History of Present Illness:    Janet Osborne is a 69 y.o. female with history of hypertension, hyperlipidemia, COPD, and thyroid disease.  I met her in July for evaluation of shortness of breath.  She also endorsed occasional "soreness" under her left breast that would come and go randomly without exertion or other precipitants.  Stress echocardiogram through [facility] clinic in 09/2018 was unrevealing.  However, given persistent symptoms, we agreed to perform cardiac CTA.  This showed moderate proximal LAD stenosis that was not hemodynamically significant by CT FFR (0.86).  Today, Ms. Murcia reports that she is feeling relatively well.  She still has some shortness of breath but better since we last spoke.  She was evaluated by Dr. Mortimer Fries in the fall; he recommended trying to wean her off some of  her inhalers.  She continues to have intermittent "soreness" under the left breast that is not exertional.  She has occasional swelling of the right ankle.  She is trying to lose weight, having lost about 3 to 4 pounds over the last few months.  Activity has been limited by foot pain following surgery last summer.  She has not had any palpitations or lightheadedness.  She is trying to use CPAP regularly but has trouble keeping it on for more than 4 hours.  She also recently developed a rash on her face and wonders if the CPAP mask may be exacerbating this.  She does not check her blood pressure regularly at home.  The patient does not have symptoms concerning for COVID-19 infection (fever, chills, cough, or new shortness of breath).    Past Medical History:  Diagnosis Date  . Abdominal hernia   . Anemia   . Anxiety   . Chronic constipation   . Colon polyps   . Complication of anesthesia    coughing episodes after colonoscopy and sugical procedure 2011 (hx copd)  . COPD (chronic obstructive pulmonary disease) ([place])   . DDD (degenerative disc disease), cervical   . Depression   . Endometriosis   . Essential hypertension, benign   . Fibrocystic breast disease   . GERD (gastroesophageal reflux disease)   . H/O hand surgery 03/17/2017  . Hemorrhoids   . Hyperlipidemia   . Hypothyroidism   . Migraine   . Nontoxic multinodular goiter   . Osteoarthrosis involving, or with mention of more than one site, but not specified as generalized, multiple  sites   . Tobacco use    Past Surgical History:  Procedure Laterality Date  . ABDOMINAL HYSTERECTOMY    . ANTERIOR CERVICAL DECOMP/DISCECTOMY FUSION    . APPENDECTOMY    . CARPAL TUNNEL RELEASE Bilateral   . CHEILECTOMY Right 05/27/2019   Procedure: CHEILECTOMY;  Surgeon: Sharlotte Alamo, DPM;  Location: [facility] ORS;  Service: Podiatry;  Laterality: Right;  . COLONOSCOPY    . ESOPHAGOGASTRODUODENOSCOPY    . ESOPHAGOGASTRODUODENOSCOPY (EGD) WITH  PROPOFOL N/A 04/03/2017   Procedure: ESOPHAGOGASTRODUODENOSCOPY (EGD) WITH PROPOFOL;  Surgeon: Manya Silvas, MD;  Location: [facility] ENDOSCOPY;  Service: Endoscopy;  Laterality: N/A;  . HERNIA REPAIR    . POLYPECTOMY    . ROTATOR CUFF REPAIR Left   . thumb surgery    . TONSILLECTOMY    . TRIGGER FINGER RELEASE Left      Current Meds  Medication Sig  . acetaminophen (TYLENOL) 500 MG tablet Take 500 mg by mouth 2 (two) times a day.  . albuterol (VENTOLIN HFA) 108 (90 Base) MCG/ACT inhaler Inhale 1-2 puffs into the lungs every 6 (six) hours as needed for wheezing or shortness of breath.  Marland Kitchen aspirin 81 MG chewable tablet Chew 81 mg by mouth daily.  . budesonide-formoterol (SYMBICORT) 160-4.5 MCG/ACT inhaler Inhale 2 puffs into the lungs 2 (two) times daily.  . Calcium Carbonate-Vit D-Min (CALTRATE 600+D PLUS MINERALS) 600-800 MG-UNIT CHEW Chew 1 tablet by mouth daily.  . Cholecalciferol (VITAMIN D) 50 MCG (2000 UT) CAPS Take 2,000 Units by mouth at bedtime.  . Cyanocobalamin (VITAMIN B-12) 5000 MCG TBDP Take 5,000 mcg by mouth at bedtime.  . diazepam (VALIUM) 5 MG tablet Take 5 mg by mouth every 6 (six) hours as needed for anxiety.  Marland Kitchen esomeprazole (NEXIUM) 40 MG capsule Take 40 mg by mouth daily at 12 noon.  Marland Kitchen estradiol (ESTRACE) 0.5 MG tablet Take 0.5 mg by mouth at bedtime.  . gabapentin (NEURONTIN) 300 MG capsule Take 300 mg by mouth daily. Take 368m in the AM, and 6058mat bedtime.  . hydrochlorothiazide (HYDRODIURIL) 25 MG tablet Take 25 mg by mouth daily.   . Marland Kitchenevothyroxine (SYNTHROID) 100 MCG tablet Take 100 mcg by mouth daily before breakfast.  . loratadine (CLARITIN REDITABS) 10 MG dissolvable tablet Take 10 mg by mouth daily as needed for allergies.  . Marland Kitchenosartan (COZAAR) 50 MG tablet Take 50 mg by mouth daily.  . Multiple Vitamins-Minerals (EYE VITAMINS PO) Take 1 tablet by mouth daily.   . Marland Kitchenropylene Glycol (SYSTANE COMPLETE OP) Place 1 drop into both eyes 2 (two) times daily as needed  (dry eyes).  . rosuvastatin (CRESTOR) 20 MG tablet Take 1 tablet (20 mg total) by mouth daily.  . sodium chloride (MURO 128) 5 % ophthalmic solution Place 1 drop into the left eye 2 (two) times a day.  . traMADol (ULTRAM) 50 MG tablet Take 50 mg by mouth every 6 (six) hours as needed for moderate pain.      Allergies:   Macrodantin [nitrofurantoin macrocrystal] and Penicillins   Social History   Tobacco Use  . Smoking status: Former Smoker    Packs/day: 0.75    Years: 45.00    Pack years: 33.75    Types: Cigarettes    Quit date: 07/2018    Years since quitting: 1.4  . Smokeless tobacco: Never Used  . Tobacco comment: Uses nicotine patch  Substance Use Topics  . Alcohol use: Yes    Comment: occassional  . Drug use: No  Family Hx: The patient's family history includes COPD in her father; Clotting disorder in her mother; Colon polyps in her father; Dementia in her mother; Diabetes in her mother; Gallbladder disease in her father and mother; HIV in her brother; Heart disease in her mother; Hypertension in her father and mother; Osteoarthritis in her father, mother, and sister; Prostate cancer in her father; Thyroid disease in her sister. There is no history of Breast cancer.  ROS:   Please see the history of present illness.   All other systems reviewed and are negative.   Prior CV studies:   The following studies were reviewed today:   Exercise stress echocardiogram (09/15/2018, [facility]): Normal baseline LVEF (greater than 55%) with hyperdynamic function with stress.  No regional wall motion abnormality.  Trivial aortic regurgitation.  Mild mitral and tricuspid regurgitation.  Labs/Other Tests and Data Reviewed:    EKG:  No ECG reviewed.  Recent Labs: 07/01/2019: BUN 10; Creatinine, Ser 0.67; Potassium 4.2; Sodium 139   Recent Lipid Panel No results found for: CHOL, TRIG, HDL, CHOLHDL, LDLCALC, LDLDIRECT  Wt Readings from Last 3 Encounters:  12/23/19 151 lb  (68.5 kg)  07/21/19 154 lb (69.9 kg)  07/01/19 159 lb (72.1 kg)     Objective:    Vital Signs:  Ht _0  (1.549 m)   Wt 151 lb (68.5 kg)   BMI 28.53 kg/m    VITAL SIGNS:  reviewed  ASSESSMENT & PLAN:    Nonobstructive coronary artery disease and atypical chest pain: Ms. Burkes has stable to slightly improved exertional dyspnea and continued atypical chest pain.  Cardiac CTA last summer showed nonobstructive CAD involving the LAD.  Preceding stress echo through [place] [facility] was also without ischemia.  I do not believe that her chest pain is primarily cardiac in nature.  I have asked her to continue to follow-up with pulmonary and to continue her current medications to prevent progression of CAD (particularly aspirin and rosuvastatin).  Hypertension: Ms. Mainer does not check her blood pressure regularly at home, though it was upper normal on last check with Dr. Doy Hutching.  Ms. Porada should continue her current medications and follow-up with Dr. Doy Hutching, as previously discussed.  Hyperlipidemia: LDL at goal on last check by Dr. Doy Hutching in 10/2019 (LDL 48).  Continue rosuvastatin 20 mg daily for target LDL less than 70.  COVID-19 Education: The signs and symptoms of COVID-19 were discussed with the patient and how to seek care for testing (follow up with PCP or arrange E-visit).  The importance of social distancing was discussed today.  Time:   Today, I have spent 15 minutes with the patient with telehealth technology discussing the above problems.     Medication Adjustments/Labs and Tests Ordered: Current medicines are reviewed at length with the patient today.  Concerns regarding medicines are outlined above.   Tests Ordered: None.  Medication Changes: None.  Follow Up:  In Person in 6 month(s)  Signed, [name], MD  12/23/2019 1:07 PM    [facility]

## 2019-12-23 ENCOUNTER — Telehealth (INDEPENDENT_AMBULATORY_CARE_PROVIDER_SITE_OTHER): Payer: PPO | Admitting: Internal Medicine

## 2019-12-23 ENCOUNTER — Other Ambulatory Visit: Payer: Self-pay

## 2019-12-23 VITALS — Ht 61.0 in | Wt 151.0 lb

## 2019-12-23 DIAGNOSIS — I2510 Atherosclerotic heart disease of native coronary artery without angina pectoris: Secondary | ICD-10-CM

## 2019-12-23 DIAGNOSIS — R0789 Other chest pain: Secondary | ICD-10-CM

## 2019-12-23 DIAGNOSIS — I10 Essential (primary) hypertension: Secondary | ICD-10-CM

## 2019-12-23 DIAGNOSIS — E785 Hyperlipidemia, unspecified: Secondary | ICD-10-CM | POA: Diagnosis not present

## 2019-12-23 NOTE — Patient Instructions (Signed)
Medication Instructions:  Your physician recommends that you continue on your current medications as directed. Please refer to the Current Medication list given to you today.  *If you need a refill on your cardiac medications before your next appointment, please call your pharmacy*  Lab Work: none If you have labs (blood work) drawn today and your tests are completely normal, you will receive your results only by: . MyChart Message (if you have MyChart) OR . A paper copy in the mail If you have any lab test that is abnormal or we need to change your treatment, we will call you to review the results.  Testing/Procedures: none  Follow-Up: At [facility], you and your health needs are our priority.  As part of our continuing mission to provide you with exceptional heart care, we have created designated Provider Care Teams.  These Care Teams include your primary Cardiologist (physician) and Advanced Practice Providers (APPs -  Physician Assistants and Nurse Practitioners) who all work together to provide you with the care you need, when you need it.  Your next appointment:   6 months  The format for your next appointment:   In Person  Provider:    You may see DR CHRISTOPHER END or one of the following Advanced Practice Providers on your designated Care Team:    Christopher Berge, NP  Ryan Dunn, PA-C  Jacquelyn Visser, PA-C  

## 2019-12-29 ENCOUNTER — Ambulatory Visit (INDEPENDENT_AMBULATORY_CARE_PROVIDER_SITE_OTHER): Payer: PPO | Admitting: Internal Medicine

## 2019-12-29 ENCOUNTER — Encounter: Payer: Self-pay | Admitting: Internal Medicine

## 2019-12-29 DIAGNOSIS — G4733 Obstructive sleep apnea (adult) (pediatric): Secondary | ICD-10-CM | POA: Diagnosis not present

## 2019-12-29 DIAGNOSIS — J449 Chronic obstructive pulmonary disease, unspecified: Secondary | ICD-10-CM

## 2019-12-29 NOTE — Patient Instructions (Signed)
Inhalers as needed Mask fitting referral

## 2019-12-29 NOTE — Progress Notes (Signed)
Name: Janet Osborne MRN: [id number] DOB: Apr 26, 1951     I connected with the patient by telephone enabled telemedicine visit and verified that I am speaking with the correct person using two identifiers.    I discussed the limitations, risks, security and privacy concerns of performing an evaluation and management service by telemedicine and the availability of in-person appointments. I also discussed with the patient that there may be a patient responsible charge related to this service. The patient expressed understanding and agreed to proceed.  PATIENT AGREES AND CONFIRMS -YES   Other persons participating in the visit and their role in the encounter: Patient, nursing  This visit type was conducted due to national recommendations for restrictions regarding the COVID-19 Pandemic (e.g. social distancing).  This format is felt to be most appropriate for this patient at this time.  All issues noted in this document were discussed and addressed.         CONSULTATION DATE: 12/29/2019 REFERRING MD : Dr. Saunders Revel    CXR 2 View  The CXR was Independently Reviewed By Me  CXR reviewed-WNL, no pneumonia or fluids   CT chest 07/2019 Independently Reviewed By Me Today CT chest-WNL, no masses, very small lung nodule Need to follow up every year   PFT  09/2019 Moderate restrictive lung disease  MILD FORM OF COPD  Sleep Study 07/2019 [facility] 19  CHIEF COMPLAINT:  Follow-up OSA    HPI Patient has chronic shortness of breath and dyspnea on exertion seems to be stable at this time Patient uses Symbicort Use albuterol as needed  Allergic rhinitis Allergy to cats  Noexacerbation at this time No evidence of heart failure at this time No evidence or signs of infection at this time No respiratory distress No fevers, chills, nausea, vomiting, diarrhea No evidence of lower extremity edema No evidence hemoptysis     PAST MEDICAL HISTORY :   has a past medical history of Abdominal  hernia, Anemia, Anxiety, Chronic constipation, Colon polyps, Complication of anesthesia, COPD (chronic obstructive pulmonary disease) ([place]), DDD (degenerative disc disease), cervical, Depression, Endometriosis, Essential hypertension, benign, Fibrocystic breast disease, GERD (gastroesophageal reflux disease), H/O hand surgery (03/17/2017), Hemorrhoids, Hyperlipidemia, Hypothyroidism, Migraine, Nontoxic multinodular goiter, Osteoarthrosis involving, or with mention of more than one site, but not specified as generalized, multiple sites, and Tobacco use.  has a past surgical history that includes Abdominal hysterectomy; Hernia repair; Rotator cuff repair (Left); Carpal tunnel release (Bilateral); Trigger finger release (Left); thumb surgery; Polypectomy; Appendectomy; Tonsillectomy; Esophagogastroduodenoscopy; Colonoscopy; Anterior cervical decomp/discectomy fusion; Esophagogastroduodenoscopy (egd) with propofol (N/A, 04/03/2017); and Cheilectomy (Right, 05/27/2019). Prior to Admission medications   Medication Sig Start Date End Date Taking? Authorizing Provider  acetaminophen (TYLENOL) 500 MG tablet Take 500 mg by mouth 2 (two) times a day.    [provider]  albuterol (VENTOLIN HFA) 108 (90 Base) MCG/ACT inhaler Inhale 1-2 puffs into the lungs every 6 (six) hours as needed for wheezing or shortness of breath.    [provider]  aspirin 81 MG chewable tablet Chew 81 mg by mouth daily.    [provider]  budesonide-formoterol (SYMBICORT) 160-4.5 MCG/ACT inhaler Inhale 2 puffs into the lungs 2 (two) times daily.    [provider]  Calcium Carbonate-Vit D-Min (CALTRATE 600+D PLUS MINERALS) 600-800 MG-UNIT CHEW Chew 1 tablet by mouth daily.    [provider]  Cholecalciferol (VITAMIN D) 50 MCG (2000 UT) CAPS Take 2,000 Units by mouth at bedtime.    [provider]  Cyanocobalamin (VITAMIN B-12) 5000 MCG TBDP Take 5,000 mcg by mouth at bedtime.     [provider]  diazepam (VALIUM) 5 MG tablet Take 5 mg by mouth every 6 (six) hours as needed for anxiety.    [provider]  esomeprazole (NEXIUM) 40 MG capsule Take 40 mg by mouth daily at 12 noon.    [provider]  estradiol (ESTRACE) 0.5 MG tablet Take 0.5 mg by mouth at bedtime. 04/16/19   [provider]  gabapentin (NEURONTIN) 300 MG capsule Take 300-600 mg by mouth See admin instructions. Take 300mg  in the AM, and 600mg  at bedtime. 12/26/13   [provider]  hydrochlorothiazide (HYDRODIURIL) 25 MG tablet Take 25 mg by mouth daily.  02/27/15 07/01/19  [provider]  levothyroxine (SYNTHROID) 100 MCG tablet Take 100 mcg by mouth daily before breakfast. 04/16/19   [provider]  loratadine (CLARITIN REDITABS) 10 MG dissolvable tablet Take 10 mg by mouth daily as needed for allergies.    [provider]  losartan (COZAAR) 50 MG tablet Take 50 mg by mouth daily.    [provider]  montelukast (SINGULAIR) 10 MG tablet Take 10 mg by mouth at bedtime. 04/08/19   [provider]  Multiple Vitamins-Minerals (EYE VITAMINS PO) Take 1 tablet by mouth daily.     [provider]  oxyCODONE (ROXICODONE) 5 MG immediate release tablet Take 1 tablet (5 mg total) by mouth every 4 (four) hours as needed. 05/27/19 05/26/20  Sharlotte Alamo, DPM  pravastatin (PRAVACHOL) 40 MG tablet Take 40 mg by mouth every evening.     [provider]  Propylene Glycol (SYSTANE COMPLETE OP) Place 1 drop into both eyes 2 (two) times daily as needed (dry eyes).    [provider]  sodium chloride (MURO 128) 5 % ophthalmic solution Place 1 drop into the left eye 2 (two) times a day.    [provider]  traMADol (ULTRAM) 50 MG tablet Take 50 mg by mouth every 6 (six) hours as needed for moderate pain.     [provider]   Allergies  Allergen Reactions  . Macrodantin [Nitrofurantoin Macrocrystal] Hives   . Penicillins Other (See Comments)    Did it involve swelling of the face/tongue/throat, SOB, or low BP? Unknown Did it involve sudden or severe rash/hives, skin peeling, or any reaction on the inside of your mouth or nose? Unknown Did you need to seek medical attention at a hospital or doctor's office? Unknown When did it last happen? as a child If all above answers are "NO", may proceed with cephalosporin use.     FAMILY HISTORY:  family history includes COPD in her father; Clotting disorder in her mother; Colon polyps in her father; Dementia in her mother; Diabetes in her mother; Gallbladder disease in her father and mother; HIV in her brother; Heart disease in her mother; Hypertension in her father and mother; Osteoarthritis in her father, mother, and sister; Prostate cancer in her father; Thyroid disease in her sister. SOCIAL HISTORY:  reports that she quit smoking about 17 months ago. Her smoking use included cigarettes. She has a 33.75 pack-year smoking history. She has never used smokeless tobacco. She reports current alcohol use. She reports that she does not use drugs.   Review of Systems:  Gen:  Denies  fever, sweats, chills weight loss  HEENT: Denies blurred vision, double vision, ear pain, eye pain, hearing loss, nose bleeds, sore throat Cardiac:  No dizziness, chest  pain or heaviness, chest tightness,edema, No JVD Resp:   No cough, -sputum production, -shortness of breath,-wheezing, -hemoptysis,  Gi: Denies swallowing difficulty, stomach pain, nausea or vomiting, diarrhea, constipation, bowel incontinence Gu:  Denies bladder incontinence, burning urine Ext:   Denies Joint pain, stiffness or swelling Skin: Denies  skin rash, easy bruising or bleeding or hives Endoc:  Denies polyuria, polydipsia , polyphagia or weight change Psych:   Denies depression, insomnia or hallucinations  Other:  All other systems negative    ASSESSMENT AND PLAN SYNOPSIS  COPD mild Symptoms  under control Continue inhalers as prescribed Uses Symbicort as needed  Moderate/Severe OSA Started AUTOCPAP Doing well Patient using nightly Having problems with mask-uses nasal mask  Former smoker 1 ppd for 45 years Follow up CT chest annually for lung cancer   COVID-19 EDUCATION: The signs and symptoms of COVID-19 were discussed with the patient and how to seek care for testing.  The importance of social distancing was discussed today. Hand Washing Techniques and avoid touching face was advised.     MEDICATION ADJUSTMENTS/LABS AND TESTS ORDERED: Inhalers as needed Mask fitting referral   CURRENT MEDICATIONS REVIEWED AT LENGTH WITH PATIENT TODAY   Patient satisfied with Plan of action and management. All questions answered  Follow up in 6 months  TOTAL TIME SPENT 21 mins  Maretta Bees Patricia Pesa, M.D.  [facility]  Medical Director [place] Director [employer] [facility] Cardio-Pulmonary Department

## 2019-12-29 NOTE — Addendum Note (Signed)
Addended by: Maryanna Shape A on: 12/29/2019 10:57 AM   Modules accepted: Orders

## 2020-01-06 DIAGNOSIS — G4733 Obstructive sleep apnea (adult) (pediatric): Secondary | ICD-10-CM | POA: Diagnosis not present

## 2020-01-11 DIAGNOSIS — E039 Hypothyroidism, unspecified: Secondary | ICD-10-CM | POA: Diagnosis not present

## 2020-01-11 DIAGNOSIS — Z79899 Other long term (current) drug therapy: Secondary | ICD-10-CM | POA: Diagnosis not present

## 2020-01-11 DIAGNOSIS — I10 Essential (primary) hypertension: Secondary | ICD-10-CM | POA: Diagnosis not present

## 2020-01-11 DIAGNOSIS — R739 Hyperglycemia, unspecified: Secondary | ICD-10-CM | POA: Diagnosis not present

## 2020-01-12 ENCOUNTER — Telehealth: Payer: Self-pay | Admitting: Internal Medicine

## 2020-01-12 NOTE — Telephone Encounter (Addendum)
Received call from pt requesting to have covid test at [facility] on 01/21/2020 vs going to [place]. I have spoken to [facility] and requested that covid test for 01/21/2020 in [place] be canceled.  covid test has been arranged at [facility] on 01/20/2020 at [facility] between 8-11a. Pt is aware and voiced her understanding.  Nothing further is needed.

## 2020-01-18 DIAGNOSIS — R739 Hyperglycemia, unspecified: Secondary | ICD-10-CM | POA: Diagnosis not present

## 2020-01-18 DIAGNOSIS — I10 Essential (primary) hypertension: Secondary | ICD-10-CM | POA: Diagnosis not present

## 2020-01-18 DIAGNOSIS — Z1211 Encounter for screening for malignant neoplasm of colon: Secondary | ICD-10-CM | POA: Diagnosis not present

## 2020-01-18 DIAGNOSIS — E782 Mixed hyperlipidemia: Secondary | ICD-10-CM | POA: Diagnosis not present

## 2020-01-18 DIAGNOSIS — J431 Panlobular emphysema: Secondary | ICD-10-CM | POA: Diagnosis not present

## 2020-01-18 DIAGNOSIS — E039 Hypothyroidism, unspecified: Secondary | ICD-10-CM | POA: Diagnosis not present

## 2020-01-18 DIAGNOSIS — Z79899 Other long term (current) drug therapy: Secondary | ICD-10-CM | POA: Diagnosis not present

## 2020-01-20 ENCOUNTER — Other Ambulatory Visit
Admission: RE | Admit: 2020-01-20 | Discharge: 2020-01-20 | Disposition: A | Discharge status: Home / Self Care | Payer: PPO | Source: Ambulatory Visit | Attending: Internal Medicine | Admitting: Internal Medicine

## 2020-01-20 ENCOUNTER — Other Ambulatory Visit: Payer: Self-pay

## 2020-01-20 DIAGNOSIS — Z20822 Contact with and (suspected) exposure to covid-19: Secondary | ICD-10-CM | POA: Insufficient documentation

## 2020-01-20 DIAGNOSIS — Z01812 Encounter for preprocedural laboratory examination: Secondary | ICD-10-CM | POA: Diagnosis not present

## 2020-01-21 ENCOUNTER — Other Ambulatory Visit (HOSPITAL_COMMUNITY): Payer: PPO

## 2020-01-21 LAB — SARS CORONAVIRUS 2 (TAT 6-24 HRS): SARS Coronavirus 2: NEGATIVE

## 2020-01-23 ENCOUNTER — Ambulatory Visit (HOSPITAL_BASED_OUTPATIENT_CLINIC_OR_DEPARTMENT_OTHER): Payer: PPO | Attending: Internal Medicine | Admitting: Internal Medicine

## 2020-01-23 ENCOUNTER — Other Ambulatory Visit: Payer: Self-pay

## 2020-01-23 DIAGNOSIS — G4733 Obstructive sleep apnea (adult) (pediatric): Secondary | ICD-10-CM

## 2020-02-03 DIAGNOSIS — M67441 Ganglion, right hand: Secondary | ICD-10-CM | POA: Diagnosis not present

## 2020-02-05 DIAGNOSIS — G4733 Obstructive sleep apnea (adult) (pediatric): Secondary | ICD-10-CM | POA: Diagnosis not present

## 2020-02-17 ENCOUNTER — Other Ambulatory Visit: Payer: Self-pay | Admitting: Internal Medicine

## 2020-02-17 DIAGNOSIS — G4733 Obstructive sleep apnea (adult) (pediatric): Secondary | ICD-10-CM | POA: Diagnosis not present

## 2020-03-04 DIAGNOSIS — G4733 Obstructive sleep apnea (adult) (pediatric): Secondary | ICD-10-CM | POA: Diagnosis not present

## 2020-03-27 ENCOUNTER — Ambulatory Visit (INDEPENDENT_AMBULATORY_CARE_PROVIDER_SITE_OTHER): Payer: PPO

## 2020-03-27 ENCOUNTER — Other Ambulatory Visit: Payer: Self-pay

## 2020-03-27 ENCOUNTER — Ambulatory Visit: Payer: PPO

## 2020-03-27 ENCOUNTER — Other Ambulatory Visit: Payer: Self-pay | Admitting: Podiatry

## 2020-03-27 ENCOUNTER — Ambulatory Visit: Payer: PPO | Admitting: Podiatry

## 2020-03-27 DIAGNOSIS — M79671 Pain in right foot: Secondary | ICD-10-CM | POA: Diagnosis not present

## 2020-03-27 DIAGNOSIS — M722 Plantar fascial fibromatosis: Secondary | ICD-10-CM | POA: Diagnosis not present

## 2020-03-27 DIAGNOSIS — M19071 Primary osteoarthritis, right ankle and foot: Secondary | ICD-10-CM

## 2020-03-27 DIAGNOSIS — L603 Nail dystrophy: Secondary | ICD-10-CM | POA: Diagnosis not present

## 2020-03-28 ENCOUNTER — Encounter: Payer: Self-pay | Admitting: Podiatry

## 2020-03-28 DIAGNOSIS — K5900 Constipation, unspecified: Secondary | ICD-10-CM | POA: Diagnosis not present

## 2020-03-28 DIAGNOSIS — J431 Panlobular emphysema: Secondary | ICD-10-CM | POA: Diagnosis not present

## 2020-03-28 DIAGNOSIS — Z8371 Family history of colonic polyps: Secondary | ICD-10-CM | POA: Diagnosis not present

## 2020-03-28 DIAGNOSIS — I2510 Atherosclerotic heart disease of native coronary artery without angina pectoris: Secondary | ICD-10-CM | POA: Diagnosis not present

## 2020-03-28 DIAGNOSIS — Z86010 Personal history of colonic polyps: Secondary | ICD-10-CM | POA: Diagnosis not present

## 2020-03-28 DIAGNOSIS — G4733 Obstructive sleep apnea (adult) (pediatric): Secondary | ICD-10-CM | POA: Diagnosis not present

## 2020-03-28 DIAGNOSIS — K219 Gastro-esophageal reflux disease without esophagitis: Secondary | ICD-10-CM | POA: Diagnosis not present

## 2020-03-28 NOTE — Progress Notes (Signed)
Subjective:  Patient ID: Janet Osborne, female    DOB: 1951/04/18,  MRN: [id number]  Chief Complaint  Patient presents with  . Nail Problem    pt is here for right foot pain, located to the medial side of the right foot, pt states that she recently had a procedure done on the right foot medial side, pt states that it is painful when walking on it.    69 y.o. female presents with the above complaint.  Patient presents with right foot pain medial side at the first metatarsophalangeal joint.  Patient had a history of previous cheilectomy that was done by Dr. Caryl Comes last year.  She states that her pain has returned over this past year.  The pain appears to be right in the first metatarsophalangeal joint.  Is painful to walk on wearing shoes.  She states the pain will often very.  Her pain scale 7 out of 10 and is dull achy in nature.  Patient also has secondary complaint of very dystrophic painful right second digit toenail.  She states this painful to debride her down herself.  She has tried self debridement which has not helped.  The deformity the nail is causing her a lot of pain.  She denies any other acute complaints.  She would like to have the toenail removed.   Review of Systems: Negative except as noted in the HPI. Denies N/V/F/Ch.  Past Medical History:  Diagnosis Date  . Abdominal hernia   . Anemia   . Anxiety   . Chronic constipation   . Colon polyps   . Complication of anesthesia    coughing episodes after colonoscopy and sugical procedure 2011 (hx copd)  . COPD (chronic obstructive pulmonary disease) ([place])   . DDD (degenerative disc disease), cervical   . Depression   . Endometriosis   . Essential hypertension, benign   . Fibrocystic breast disease   . GERD (gastroesophageal reflux disease)   . H/O hand surgery 03/17/2017  . Hemorrhoids   . Hyperlipidemia   . Hypothyroidism   . Migraine   . Nontoxic multinodular goiter   . Osteoarthrosis involving, or with mention  of more than one site, but not specified as generalized, multiple sites   . Tobacco use     Current Outpatient Medications:  .  acetaminophen (TYLENOL) 500 MG tablet, Take 500 mg by mouth 2 (two) times a day., Disp: , Rfl:  .  albuterol (VENTOLIN HFA) 108 (90 Base) MCG/ACT inhaler, Inhale 1-2 puffs into the lungs every 6 (six) hours as needed for wheezing or shortness of breath., Disp: , Rfl:  .  aspirin 81 MG chewable tablet, Chew 81 mg by mouth daily., Disp: , Rfl:  .  budesonide-formoterol (SYMBICORT) 160-4.5 MCG/ACT inhaler, Inhale 2 puffs into the lungs 2 (two) times daily., Disp: , Rfl:  .  Calcium Carbonate-Vit D-Min (CALTRATE 600+D PLUS MINERALS) 600-800 MG-UNIT CHEW, Chew 1 tablet by mouth daily., Disp: , Rfl:  .  Cholecalciferol (VITAMIN D) 50 MCG (2000 UT) CAPS, Take 2,000 Units by mouth at bedtime., Disp: , Rfl:  .  Cyanocobalamin (VITAMIN B-12) 5000 MCG TBDP, Take 5,000 mcg by mouth at bedtime., Disp: , Rfl:  .  diazepam (VALIUM) 5 MG tablet, Take 5 mg by mouth every 6 (six) hours as needed for anxiety., Disp: , Rfl:  .  esomeprazole (NEXIUM) 40 MG capsule, Take 40 mg by mouth daily at 12 noon., Disp: , Rfl:  .  estradiol (ESTRACE) 0.5 MG tablet, Take  0.5 mg by mouth at bedtime., Disp: , Rfl:  .  gabapentin (NEURONTIN) 300 MG capsule, Take 300 mg by mouth daily. Take 300mg  in the AM, and 600mg  at bedtime., Disp: , Rfl:  .  levothyroxine (SYNTHROID) 100 MCG tablet, Take 100 mcg by mouth daily before breakfast., Disp: , Rfl:  .  loratadine (CLARITIN REDITABS) 10 MG dissolvable tablet, Take 10 mg by mouth daily as needed for allergies., Disp: , Rfl:  .  losartan (COZAAR) 50 MG tablet, Take 50 mg by mouth daily., Disp: , Rfl:  .  Multiple Vitamins-Minerals (EYE VITAMINS PO), Take 1 tablet by mouth daily. , Disp: , Rfl:  .  Propylene Glycol (SYSTANE COMPLETE OP), Place 1 drop into both eyes 2 (two) times daily as needed (dry eyes)., Disp: , Rfl:  .  rosuvastatin (CRESTOR) 20 MG tablet,  TAKE ONE TABLET BY MOUTH EVERY DAY, Disp: 90 tablet, Rfl: 0 .  sodium chloride (MURO 128) 5 % ophthalmic solution, Place 1 drop into the left eye 2 (two) times a day., Disp: , Rfl:  .  traMADol (ULTRAM) 50 MG tablet, Take 50 mg by mouth every 6 (six) hours as needed for moderate pain. , Disp: , Rfl:  .  hydrochlorothiazide (HYDRODIURIL) 25 MG tablet, Take 25 mg by mouth daily. , Disp: , Rfl:   Social History   Tobacco Use  Smoking Status Former Smoker  . Packs/day: 0.75  . Years: 45.00  . Pack years: 33.75  . Types: Cigarettes  . Quit date: 07/2018  . Years since quitting: 1.7  Smokeless Tobacco Never Used  Tobacco Comment   Uses nicotine patch    Allergies  Allergen Reactions  . Macrodantin [Nitrofurantoin Macrocrystal] Hives  . Penicillins Other (See Comments)    Did it involve swelling of the face/tongue/throat, SOB, or low BP? Unknown Did it involve sudden or severe rash/hives, skin peeling, or any reaction on the inside of your mouth or nose? Unknown Did you need to seek medical attention at a hospital or doctor's office? Unknown When did it last happen? as a child If all above answers are "NO", may proceed with cephalosporin use.    Objective:  There were no vitals filed for this visit. There is no height or weight on file to calculate BMI. Constitutional Well developed. Well nourished.  Vascular Dorsalis pedis pulses palpable bilaterally. Posterior tibial pulses palpable bilaterally. Capillary refill normal to all digits.  No cyanosis or clubbing noted. Pedal hair growth normal.  Neurologic Normal speech. Oriented to person, place, and time. Epicritic sensation to light touch grossly present bilaterally.  Dermatologic Pain on palpation of the entire/total nail on 2nd digit of the right No open wounds. No skin lesions.  Orthopedic:  Pain on palpation to the first metatarsophalangeal joint.  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  Joint  space is still present with very limited range of motion.  Painful with active and passive range of motion.  Intra-articular first metatarsophalangeal joint right foot noted.  Previous surgical scar noted.  No pain with range of motion of the intermetatarsal phalangeal joint of the hallux.  No intra-articular pain noted at the right first IPJ   Radiographs: 3 views of skeletally mature adult right foot: Complete obliteration of the first metatarsophalangeal joint noted.  Severe arthrosis of the first metatarsophalangeal joint noted.  No other arthritic changes noted.  The intermetatarsal phalangeal joint is intact.  No other bony abnormalities identified. Assessment:   1. Foot pain, right  2. Arthritis of first metatarsophalangeal (MTP) joint of right foot   3. Nail dystrophy    Plan:  Patient was evaluated and treated and all questions answered.  Right first metatarsophalangeal joint arthritis severe -I explained to the patient the etiology of arthritis and various treatment options were extensively discussed.  Given the amount of arthritis patient will benefit from fusion of the first metatarsophalangeal joint.  I discussed this with the patient in extensive detail.  I believe given her lifestyle which is sedentary and limited in nature I believe she will benefit from a fusion as opposed to an arthroplasty of the first metatarsophalangeal joint. -However for now I would like to proceed with a steroid injection to help decrease inflammatory pain associated with pain.  She also has custom-made orthotics she had done in the past I have discussed with her to bring those in for me to evaluate.  However I believe she will benefit from custom-made orthotics in the near future with a Morton's extension to take the pressure off of the first metatarsophalangeal joint. -Patient agrees with the plan would like to proceed with a steroid injection.A steroid injection was performed at right first  metatarsophalangeal joint using 1% plain Lidocaine and 10 mg of Kenalog. This was well tolerated. -I will discuss surgical management during next visit.  Nail contusion/dystrophy second digit, right -Patient elects to proceed with minor surgery to remove entire toenail today. Consent reviewed and signed by patient. -Entire/total nail excised. See procedure note. -Educated on post-procedure care including soaking. Written instructions provided and reviewed. -Patient to follow up in 2 weeks for nail check.  Procedure: Excision of entire/total nail  Location: Right 2nd toe digit Anesthesia: Lidocaine 1% plain; 1.5 mL and Marcaine 0.5% plain; 1.5 mL, digital block. Skin Prep: Betadine. Dressing: Silvadene; telfa; dry, sterile, compression dressing. Technique: Following skin prep, the toe was exsanguinated and a tourniquet was secured at the base of the toe. The affected nail border was freed and excised. The tourniquet was then removed and sterile dressing applied. Disposition: Patient tolerated procedure well. Patient to return in 2 weeks for follow-up.   No follow-ups on file.    No follow-ups on file.

## 2020-03-30 ENCOUNTER — Telehealth: Payer: Self-pay | Admitting: Internal Medicine

## 2020-03-30 NOTE — Telephone Encounter (Signed)
Dr. Mortimer Fries, We have received paperwork for procedure clearance for this patient for an upcoming colonoscopy. THe forms have been placed in your folder to be signed.  Thanks

## 2020-04-02 ENCOUNTER — Telehealth: Payer: Self-pay | Admitting: Internal Medicine

## 2020-04-02 NOTE — Telephone Encounter (Signed)
° °  [facility] Pre-operative Risk Assessment    Request for surgical clearance:  1. What type of surgery is being performed?  Colonoscopy    2. When is this surgery scheduled?  07/04/20   3. What type of clearance is required (medical clearance vs. Pharmacy clearance to hold med vs. Both)? both  4. Are there any medications that need to be held prior to surgery and how long? Please advise    5. Practice name and name of physician performing surgery?  [facility]  Michelle Mcghee    6. What is your office phone number [phone]    7.   What is your office fax number [phone]  8.   Anesthesia type (None, local, MAC, general) ?  Monitored    Clarisse Gouge 04/02/2020, 11:42 AM  _________________________________________________________________   (provider comments below)

## 2020-04-03 NOTE — Telephone Encounter (Signed)
Please review chart in June.

## 2020-04-04 DIAGNOSIS — G4733 Obstructive sleep apnea (adult) (pediatric): Secondary | ICD-10-CM | POA: Diagnosis not present

## 2020-04-04 NOTE — Telephone Encounter (Addendum)
   Primary Cardiologist: [name], MD  Chart reviewed as part of pre-operative protocol coverage. Agree with prior APP comment - Given that procedure is over 3 months away, our team will revisit clearance closer to procedure (within 2 months) for a better informed clinical decision. Will send update to requesting office to make them aware.  Charlie Pitter, PA-C 04/04/2020, 11:21 AM

## 2020-04-06 DIAGNOSIS — H0288A Meibomian gland dysfunction right eye, upper and lower eyelids: Secondary | ICD-10-CM | POA: Diagnosis not present

## 2020-04-16 DIAGNOSIS — E782 Mixed hyperlipidemia: Secondary | ICD-10-CM | POA: Diagnosis not present

## 2020-04-16 DIAGNOSIS — R739 Hyperglycemia, unspecified: Secondary | ICD-10-CM | POA: Diagnosis not present

## 2020-04-16 DIAGNOSIS — E039 Hypothyroidism, unspecified: Secondary | ICD-10-CM | POA: Diagnosis not present

## 2020-04-16 DIAGNOSIS — Z79899 Other long term (current) drug therapy: Secondary | ICD-10-CM | POA: Diagnosis not present

## 2020-04-16 NOTE — Telephone Encounter (Signed)
   Primary Cardiologist: [name], MD   Chart revisited for pre-op review. It now appears patient now has an appointment with Dr. Saunders Revel for their regular follow-up on 06/22/20 - this is closer to procedure date and more appropriate to make a better informed decision at that time. Pre-op clearance modifier added to appointment notes. Will route to requesting provider to make them aware. Per office protocol, the provider should assess clearance at time of office visit and should forward their finalized clearance decision to requesting party below. I will remove this message from the pre-op box.   Charlie Pitter, PA-C  04/04/2020, 11:21 AM

## 2020-04-19 NOTE — Telephone Encounter (Signed)
Dr. Mortimer Fries have you seen these forms in your folder. Do you know if they have been completed?

## 2020-04-19 NOTE — Telephone Encounter (Signed)
All paper work was signed 2 days ago. I dont know if this patients was in the folder

## 2020-04-23 ENCOUNTER — Other Ambulatory Visit: Payer: Self-pay | Admitting: Internal Medicine

## 2020-04-23 DIAGNOSIS — I10 Essential (primary) hypertension: Secondary | ICD-10-CM | POA: Diagnosis not present

## 2020-04-23 DIAGNOSIS — J449 Chronic obstructive pulmonary disease, unspecified: Secondary | ICD-10-CM | POA: Diagnosis not present

## 2020-04-23 DIAGNOSIS — Z1231 Encounter for screening mammogram for malignant neoplasm of breast: Secondary | ICD-10-CM

## 2020-04-23 DIAGNOSIS — Z79899 Other long term (current) drug therapy: Secondary | ICD-10-CM | POA: Diagnosis not present

## 2020-04-23 DIAGNOSIS — E782 Mixed hyperlipidemia: Secondary | ICD-10-CM | POA: Diagnosis not present

## 2020-04-23 DIAGNOSIS — J431 Panlobular emphysema: Secondary | ICD-10-CM | POA: Diagnosis not present

## 2020-04-23 DIAGNOSIS — R739 Hyperglycemia, unspecified: Secondary | ICD-10-CM | POA: Diagnosis not present

## 2020-04-23 DIAGNOSIS — E039 Hypothyroidism, unspecified: Secondary | ICD-10-CM | POA: Diagnosis not present

## 2020-04-23 DIAGNOSIS — M5030 Other cervical disc degeneration, unspecified cervical region: Secondary | ICD-10-CM | POA: Diagnosis not present

## 2020-04-23 NOTE — Telephone Encounter (Signed)
Procedure Clearance form has been located I also called [facility] to confirm that they received it. They did confirm. Will be placed in scan folder. Nothing further needed at this time.

## 2020-05-01 ENCOUNTER — Ambulatory Visit: Payer: PPO | Admitting: Podiatry

## 2020-05-01 ENCOUNTER — Other Ambulatory Visit: Payer: Self-pay

## 2020-05-01 DIAGNOSIS — M79671 Pain in right foot: Secondary | ICD-10-CM | POA: Diagnosis not present

## 2020-05-01 DIAGNOSIS — Q666 Other congenital valgus deformities of feet: Secondary | ICD-10-CM

## 2020-05-01 DIAGNOSIS — Z01818 Encounter for other preprocedural examination: Secondary | ICD-10-CM

## 2020-05-01 DIAGNOSIS — M19071 Primary osteoarthritis, right ankle and foot: Secondary | ICD-10-CM

## 2020-05-01 NOTE — Patient Instructions (Signed)
Pre-Operative Instructions  Congratulations, you have decided to take an important step towards improving your quality of life.  You can be assured that the doctors and staff at [facility] will be with you every step of the way.  Here are some important things you should know:  1. Plan to be at the surgery center/hospital at least 1 (one) hour prior to your scheduled time, unless otherwise directed by the surgical center/hospital staff.  You must have a responsible adult accompany you, remain during the surgery and drive you home.  Make sure you have directions to the surgical center/hospital to ensure you arrive on time. 2. If you are having surgery at [facility] or [facility], you will need a copy of your medical history and physical form from your family physician within one month prior to the date of surgery. We will give you a form for your primary physician to complete.  3. We make every effort to accommodate the date you request for surgery.  However, there are times where surgery dates or times have to be moved.  We will contact you as soon as possible if a change in schedule is required.   4. No aspirin/ibuprofen for one week before surgery.  If you are on aspirin, any non-steroidal anti-inflammatory medications (Mobic, Aleve, Ibuprofen) should not be taken seven (7) days prior to your surgery.  You make take Tylenol for pain prior to surgery.  5. Medications - If you are taking daily heart and blood pressure medications, seizure, reflux, allergy, asthma, anxiety, pain or diabetes medications, make sure you notify the surgery center/hospital before the day of surgery so they can tell you which medications you should take or avoid the day of surgery. 6. No food or drink after midnight the night before surgery unless directed otherwise by surgical center/hospital staff. 7. No alcoholic beverages 24-hours prior to surgery.  No smoking 24-hours prior or 24-hours after  surgery. 8. Wear loose pants or shorts. They should be loose enough to fit over bandages, boots, and casts. 9. Don't wear slip-on shoes. Sneakers are preferred. 10. Bring your boot with you to the surgery center/hospital.  Also bring crutches or a walker if your physician has prescribed it for you.  If you do not have this equipment, it will be provided for you after surgery. 11. If you have not been contacted by the surgery center/hospital by the day before your surgery, call to confirm the date and time of your surgery. 12. Leave-time from work may vary depending on the type of surgery you have.  Appropriate arrangements should be made prior to surgery with your employer. 13. Prescriptions will be provided immediately following surgery by your doctor.  Fill these as soon as possible after surgery and take the medication as directed. Pain medications will not be refilled on weekends and must be approved by the doctor. 14. Remove nail polish on the operative foot and avoid getting pedicures prior to surgery. 15. Wash the night before surgery.  The night before surgery wash the foot and leg well with water and the antibacterial soap provided. Be sure to pay special attention to beneath the toenails and in between the toes.  Wash for at least three (3) minutes. Rinse thoroughly with water and dry well with a towel.  Perform this wash unless told not to do so by your physician.  Enclosed: 1 Ice pack (please put in freezer the night before surgery)   1 Hibiclens skin cleaner     Pre-op instructions  If you have any questions regarding the instructions, please do not hesitate to call our office.  [place]: [address], [place], [place] [address] -- [phone]  [place]: [address] [place], [place] [address] -- [phone]  [place]: [address], [place], [place] [address] -- [phone]   Website: [email or web address] 

## 2020-05-04 ENCOUNTER — Encounter: Payer: Self-pay | Admitting: Podiatry

## 2020-05-04 DIAGNOSIS — G4733 Obstructive sleep apnea (adult) (pediatric): Secondary | ICD-10-CM | POA: Diagnosis not present

## 2020-05-04 NOTE — Progress Notes (Signed)
Subjective:  Patient ID: Janet Osborne, female    DOB: October 13, 1951,  MRN: [id number]  Chief Complaint  Patient presents with  . Foot Pain    pt is here for a nail check as well as a f/u for past surgery, pt states that she is doing alot better since the last time she was here, but states that she is looking to discuss surgery.    69 y.o. female presents with the above complaint.  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  Patient states the injection did help for a little bit however she did not get as much relief as she thought.  She states that she has tried all the over-the-counter treatment options for this.  She would like to discuss surgical options at this time.  Patient also brought inserts that she had made over 25 years ago with her to be evaluated.  They appear to be orthotic without full length.  She denies any other acute complaints.  Given that she has failed all conservative therapy including injection modification orthotic modification shoe gear modification I believe she will benefit from a surgical intervention at this time.   Review of Systems: Negative except as noted in the HPI. Denies N/V/F/Ch.  Past Medical History:  Diagnosis Date  . Abdominal hernia   . Anemia   . Anxiety   . Chronic constipation   . Colon polyps   . Complication of anesthesia    coughing episodes after colonoscopy and sugical procedure 2011 (hx copd)  . COPD (chronic obstructive pulmonary disease) ([place])   . DDD (degenerative disc disease), cervical   . Depression   . Endometriosis   . Essential hypertension, benign   . Fibrocystic breast disease   . GERD (gastroesophageal reflux disease)   . H/O hand surgery 03/17/2017  . Hemorrhoids   . Hyperlipidemia   . Hypothyroidism   . Migraine   . Nontoxic multinodular goiter   . Osteoarthrosis involving, or with mention of more than one site, but not specified as generalized, multiple  sites   . Tobacco use     Current Outpatient Medications:  .  acetaminophen (TYLENOL) 500 MG tablet, Take 500 mg by mouth 2 (two) times a day., Disp: , Rfl:  .  albuterol (VENTOLIN HFA) 108 (90 Base) MCG/ACT inhaler, Inhale 1-2 puffs into the lungs every 6 (six) hours as needed for wheezing or shortness of breath., Disp: , Rfl:  .  aspirin 81 MG chewable tablet, Chew 81 mg by mouth daily., Disp: , Rfl:  .  budesonide-formoterol (SYMBICORT) 160-4.5 MCG/ACT inhaler, Inhale 2 puffs into the lungs 2 (two) times daily., Disp: , Rfl:  .  Calcium Carbonate-Vit D-Min (CALTRATE 600+D PLUS MINERALS) 600-800 MG-UNIT CHEW, Chew 1 tablet by mouth daily., Disp: , Rfl:  .  Cholecalciferol (VITAMIN D) 50 MCG (2000 UT) CAPS, Take 2,000 Units by mouth at bedtime., Disp: , Rfl:  .  Cyanocobalamin (VITAMIN B-12) 5000 MCG TBDP, Take 5,000 mcg by mouth at bedtime., Disp: , Rfl:  .  diazepam (VALIUM) 5 MG tablet, Take 5 mg by mouth every 6 (six) hours as needed for anxiety., Disp: , Rfl:  .  Docusate Sodium (DSS) 100 MG CAPS, Take by mouth., Disp: , Rfl:  .  doxycycline (VIBRAMYCIN) 100 MG capsule, Take 100 mg by mouth 2 (two) times daily., Disp: , Rfl:  .  esomeprazole (NEXIUM) 40 MG capsule, Take 40 mg by mouth daily at 12 noon., Disp: ,  Rfl:  .  estradiol (ESTRACE) 0.5 MG tablet, Take 0.5 mg by mouth at bedtime., Disp: , Rfl:  .  gabapentin (NEURONTIN) 300 MG capsule, Take 300 mg by mouth daily. Take 300mg  in the AM, and 600mg  at bedtime., Disp: , Rfl:  .  levothyroxine (SYNTHROID) 100 MCG tablet, Take 100 mcg by mouth daily before breakfast., Disp: , Rfl:  .  loratadine (CLARITIN REDITABS) 10 MG dissolvable tablet, Take 10 mg by mouth daily as needed for allergies., Disp: , Rfl:  .  losartan (COZAAR) 50 MG tablet, Take 50 mg by mouth daily., Disp: , Rfl:  .  Multiple Vitamins-Minerals (EYE VITAMINS PO), Take 1 tablet by mouth daily. , Disp: , Rfl:  .  Propylene Glycol (SYSTANE COMPLETE OP), Place 1 drop into both  eyes 2 (two) times daily as needed (dry eyes)., Disp: , Rfl:  .  rosuvastatin (CRESTOR) 20 MG tablet, TAKE ONE TABLET BY MOUTH EVERY DAY, Disp: 90 tablet, Rfl: 0 .  sodium chloride (MURO 128) 5 % ophthalmic solution, Place 1 drop into the left eye 2 (two) times a day., Disp: , Rfl:  .  traMADol (ULTRAM) 50 MG tablet, Take 50 mg by mouth every 6 (six) hours as needed for moderate pain. , Disp: , Rfl:  .  hydrochlorothiazide (HYDRODIURIL) 25 MG tablet, Take 25 mg by mouth daily. , Disp: , Rfl:   Social History   Tobacco Use  Smoking Status Former Smoker  . Packs/day: 0.75  . Years: 45.00  . Pack years: 33.75  . Types: Cigarettes  . Quit date: 07/2018  . Years since quitting: 1.8  Smokeless Tobacco Never Used  Tobacco Comment   Uses nicotine patch    Allergies  Allergen Reactions  . Macrodantin [Nitrofurantoin Macrocrystal] Hives  . Penicillins Other (See Comments)    Did it involve swelling of the face/tongue/throat, SOB, or low BP? Unknown Did it involve sudden or severe rash/hives, skin peeling, or any reaction on the inside of your mouth or nose? Unknown Did you need to seek medical attention at a hospital or doctor's office? Unknown When did it last happen? as a child If all above answers are "NO", may proceed with cephalosporin use.    Objective:  There were no vitals filed for this visit. There is no height or weight on file to calculate BMI. Constitutional Well developed. Well nourished.  Vascular Dorsalis pedis pulses palpable bilaterally. Posterior tibial pulses palpable bilaterally. Capillary refill normal to all digits.  No cyanosis or clubbing noted. Pedal hair growth normal.  Neurologic Normal speech. Oriented to person, place, and time. Epicritic sensation to light touch grossly present bilaterally.  Dermatologic Pain on palpation of the entire/total nail on 2nd digit of the right No open wounds. No skin lesions.  Orthopedic:  Pain on palpation to the  first metatarsophalangeal joint.  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  Joint space is still present with very limited range of motion.  Painful with active and passive range of motion.  Intra-articular first metatarsophalangeal joint right foot noted.  Previous surgical scar noted.  No pain with range of motion of the intermetatarsal phalangeal joint of the hallux.  No intra-articular pain noted at the right first IPJ  Gait examination showed there is calcaneal eversion on stance weightbearing with too many toe signs partially able to recreate the arch with dorsiflexion of the hallux.  Patient is unable to perform single and double heel raises.   Radiographs: 3 views of skeletally  mature adult right foot: Complete obliteration of the first metatarsophalangeal joint noted.  Severe arthrosis of the first metatarsophalangeal joint noted.  No other arthritic changes noted.  The intermetatarsal phalangeal joint is intact.  No other bony abnormalities identified. Assessment:   1. Arthritis of first metatarsophalangeal (MTP) joint of right foot   2. Pes planovalgus   3. Foot pain, right   4. Preoperative examination    Plan:  Patient was evaluated and treated and all questions answered.  Right first metatarsophalangeal joint arthritis severe -I explained to the patient the etiology of arthritis and various treatment options were extensively discussed.  Given the amount of arthritis patient will benefit from fusion of the first metatarsophalangeal joint.  I discussed this with the patient in extensive detail.  I believe given her lifestyle which is sedentary and limited in nature I believe she will benefit from a fusion as opposed to an arthroplasty of the first metatarsophalangeal joint. -However for now I would like to proceed with a steroid injection to help decrease inflammatory pain associated with pain.  She also has custom-made orthotics she had done in the past I have  discussed with her to bring those in for me to evaluate.  However I believe she will benefit from custom-made orthotics in the near future with a Morton's extension to take the pressure off of the first metatarsophalangeal joint.  -I discussed with the patient in extensive detail the surgical management of right first metatarsophalangeal joint with severe arthritis.  I discussed my radiographic findings with the patient in extensive detail.  Given how severe the arthritic changes I believe she will benefit from a right first metatarsophalangeal joint fusion.  Patient agrees with the plan would like to proceed with the fusion.  I discussed my postop protocol in great length with the patient.   -Informed surgical risk consent was reviewed and read aloud to the patient.  I reviewed the films.  I have discussed my findings with the patient in great detail.  I have discussed all risks including but not limited to infection, stiffness, scarring, limp, disability, deformity, damage to blood vessels and nerves, numbness, poor healing, need for braces, arthritis, chronic pain, amputation, death.  All benefits and realistic expectations discussed in great detail.  I have made no promises as to the outcome.  I have provided realistic expectations.  I have offered the patient a 2nd opinion, which they have declined and assured me they preferred to proceed despite the risks -A total of 37 minutes was spent in direct patient care as well as pre and post patient encounter activities.  This includes documentation as well as reviewing patient chart for labs, imaging, past medical, surgical, social, and family history as documented in the EMR.  I have reviewed medication allergies as documented in EMR.  I discussed the etiology of condition and treatment options from conservative to surgical care.  All risks and benefit of the treatment course was discussed in detail.  All questions were answered and return appointment was  discussed.  Since the visit completed in an ambulatory/outpatient setting, the patient and/or parent/guardian has been advised to contact the providers office for worsening condition and seek medical treatment and/or call 911 if the patient deems either is necessary.   Pes planus semiflexible -I explained the patient the etiology of pes planus deformity and various treatment options were extensively discussed.  Given that patient has severe arthritis in the first metatarsophalangeal joint even after the surgical fusion of the  joint I believe she will benefit from custom-made orthotics with Morton's extension as well as support the arches of the foot and control the hindfoot motion. -Should be scheduled see Rick for custom-made orthotics.  Nail contusion/dystrophy second digit, right -Patient elects to proceed with minor surgery to remove entire toenail today. Consent reviewed and signed by patient. -Entire/total nail excised. See procedure note. -Educated on post-procedure care including soaking. Written instructions provided and reviewed. -Patient to follow up in 2 weeks for nail check.  Procedure: Excision of entire/total nail  Location: Right 2nd toe digit Anesthesia: Lidocaine 1% plain; 1.5 mL and Marcaine 0.5% plain; 1.5 mL, digital block. Skin Prep: Betadine. Dressing: Silvadene; telfa; dry, sterile, compression dressing. Technique: Following skin prep, the toe was exsanguinated and a tourniquet was secured at the base of the toe. The affected nail border was freed and excised. The tourniquet was then removed and sterile dressing applied. Disposition: Patient tolerated procedure well. Patient to return in 2 weeks for follow-up.   Return for See Liliane Channel for Diabetic shoes ASAP.    Return for See Liliane Channel for Diabetic shoes ASAP.

## 2020-05-09 ENCOUNTER — Telehealth: Payer: Self-pay

## 2020-05-09 NOTE — Telephone Encounter (Signed)
DOS 05/21/2020  1ST MPJ FUSION [facility] - [place] [phone] GOOD FROM 05/11/20 - 08/09/20

## 2020-05-11 ENCOUNTER — Ambulatory Visit: Payer: PPO | Admitting: Orthotics

## 2020-05-11 ENCOUNTER — Other Ambulatory Visit: Payer: Self-pay

## 2020-05-11 DIAGNOSIS — Q666 Other congenital valgus deformities of feet: Secondary | ICD-10-CM | POA: Diagnosis not present

## 2020-05-11 DIAGNOSIS — M19071 Primary osteoarthritis, right ankle and foot: Secondary | ICD-10-CM

## 2020-05-11 NOTE — Progress Notes (Signed)
Patient is here today to be evaluated and cast for CMFO.  Patient has hx of functional hallux limitus (FHL) R, and needs a supportive orthoses that will plantarflex first ray in order to lower hinge pin of first MPJ and enhance windless effect.  Plan of deep heel cup, hug arch, and reverse mortons extension.  Richy to fab.

## 2020-05-19 ENCOUNTER — Other Ambulatory Visit: Payer: Self-pay | Admitting: Internal Medicine

## 2020-05-21 ENCOUNTER — Encounter: Payer: Self-pay | Admitting: Podiatry

## 2020-05-21 DIAGNOSIS — M13879 Other specified arthritis, unspecified ankle and foot: Secondary | ICD-10-CM | POA: Diagnosis not present

## 2020-05-21 DIAGNOSIS — M19071 Primary osteoarthritis, right ankle and foot: Secondary | ICD-10-CM | POA: Diagnosis not present

## 2020-05-21 DIAGNOSIS — I10 Essential (primary) hypertension: Secondary | ICD-10-CM | POA: Diagnosis not present

## 2020-05-21 DIAGNOSIS — M25571 Pain in right ankle and joints of right foot: Secondary | ICD-10-CM | POA: Diagnosis not present

## 2020-05-21 DIAGNOSIS — M2021 Hallux rigidus, right foot: Secondary | ICD-10-CM | POA: Diagnosis not present

## 2020-05-21 DIAGNOSIS — M13871 Other specified arthritis, right ankle and foot: Secondary | ICD-10-CM | POA: Diagnosis not present

## 2020-05-21 MED ORDER — IBUPROFEN 800 MG PO TABS: 800.0000 mg | ORAL_TABLET | Freq: Four times a day (QID) | ORAL | 1 refills | Status: DC | PRN

## 2020-05-21 MED ORDER — OXYCODONE-ACETAMINOPHEN 10-325 MG PO TABS: 1.0000 | ORAL_TABLET | Freq: Four times a day (QID) | ORAL | 0 refills | Status: AC | PRN

## 2020-05-21 NOTE — Addendum Note (Signed)
Addended by: Boneta Lucks on: 05/21/2020 07:06 AM   Modules accepted: Orders

## 2020-05-29 ENCOUNTER — Ambulatory Visit (INDEPENDENT_AMBULATORY_CARE_PROVIDER_SITE_OTHER): Payer: PPO | Admitting: Podiatry

## 2020-05-29 ENCOUNTER — Other Ambulatory Visit: Payer: Self-pay

## 2020-05-29 ENCOUNTER — Ambulatory Visit (INDEPENDENT_AMBULATORY_CARE_PROVIDER_SITE_OTHER): Payer: PPO

## 2020-05-29 ENCOUNTER — Encounter: Payer: Self-pay | Admitting: Podiatry

## 2020-05-29 DIAGNOSIS — M19071 Primary osteoarthritis, right ankle and foot: Secondary | ICD-10-CM

## 2020-05-29 DIAGNOSIS — Z9889 Other specified postprocedural states: Secondary | ICD-10-CM

## 2020-05-29 MED ORDER — DOXYCYCLINE HYCLATE 100 MG PO TABS
100.0000 mg | ORAL_TABLET | Freq: Two times a day (BID) | ORAL | 0 refills | Status: DC
Start: 2020-05-29 — End: 2020-07-17

## 2020-05-29 NOTE — Progress Notes (Signed)
Subjective:  Patient ID: Janet Osborne, female    DOB: [birth year],  MRN: [id number]  Chief Complaint  Patient presents with  . Routine Post Op     POV #1 DOS 05/21/2020 RT FIRST MPJ FUSION     69 y.o. female returns for post-op check.  Patient states that she is doing well.  She states the sutures are intact.  Her pain is well controlled.  Patient has stopped taking pain medication.  She denies any other acute complaints.  Patient states that she did have a in the earlier week where she stumbled onto her knee and injured her foot as well.  She states the foot was in a boot.  She does not think she did any damage but she just wanted to make sure on the x-ray.  Review of Systems: Negative except as noted in the HPI. Denies N/V/F/Ch.  Past Medical History:  Diagnosis Date  . Abdominal hernia   . Anemia   . Anxiety   . Chronic constipation   . Colon polyps   . Complication of anesthesia    coughing episodes after colonoscopy and sugical procedure 2011 (hx copd)  . COPD (chronic obstructive pulmonary disease) ([place])   . DDD (degenerative disc disease), cervical   . Depression   . Endometriosis   . Essential hypertension, benign   . Fibrocystic breast disease   . GERD (gastroesophageal reflux disease)   . H/O hand surgery 03/17/2017  . Hemorrhoids   . Hyperlipidemia   . Hypothyroidism   . Migraine   . Nontoxic multinodular goiter   . Osteoarthrosis involving, or with mention of more than one site, but not specified as generalized, multiple sites   . Tobacco use     Current Outpatient Medications:  .  acetaminophen (TYLENOL) 500 MG tablet, Take 500 mg by mouth 2 (two) times a day., Disp: , Rfl:  .  albuterol (VENTOLIN HFA) 108 (90 Base) MCG/ACT inhaler, Inhale 1-2 puffs into the lungs every 6 (six) hours as needed for wheezing or shortness of breath., Disp: , Rfl:  .  aspirin 81 MG chewable tablet, Chew 81 mg by mouth daily., Disp: , Rfl:  .  budesonide-formoterol (SYMBICORT)  160-4.5 MCG/ACT inhaler, Inhale 2 puffs into the lungs 2 (two) times daily., Disp: , Rfl:  .  Calcium Carbonate-Vit D-Min (CALTRATE 600+D PLUS MINERALS) 600-800 MG-UNIT CHEW, Chew 1 tablet by mouth daily., Disp: , Rfl:  .  Cholecalciferol (VITAMIN D) 50 MCG (2000 UT) CAPS, Take 2,000 Units by mouth at bedtime., Disp: , Rfl:  .  Cyanocobalamin (VITAMIN B-12) 5000 MCG TBDP, Take 5,000 mcg by mouth at bedtime., Disp: , Rfl:  .  diazepam (VALIUM) 5 MG tablet, Take 5 mg by mouth every 6 (six) hours as needed for anxiety., Disp: , Rfl:  .  Docusate Sodium (DSS) 100 MG CAPS, Take by mouth., Disp: , Rfl:  .  doxycycline (VIBRAMYCIN) 100 MG capsule, Take 100 mg by mouth 2 (two) times daily., Disp: , Rfl:  .  esomeprazole (NEXIUM) 40 MG capsule, Take 40 mg by mouth daily at 12 noon., Disp: , Rfl:  .  estradiol (ESTRACE) 0.5 MG tablet, Take 0.5 mg by mouth at bedtime., Disp: , Rfl:  .  gabapentin (NEURONTIN) 300 MG capsule, Take 300 mg by mouth daily. Take 300mg  in the AM, and 600mg  at bedtime., Disp: , Rfl:  .  ibuprofen (ADVIL) 800 MG tablet, Take 1 tablet (800 mg total) by mouth every 6 (six) hours as  needed., Disp: 60 tablet, Rfl: 1 .  levothyroxine (SYNTHROID) 100 MCG tablet, Take 100 mcg by mouth daily before breakfast., Disp: , Rfl:  .  loratadine (CLARITIN REDITABS) 10 MG dissolvable tablet, Take 10 mg by mouth daily as needed for allergies., Disp: , Rfl:  .  losartan (COZAAR) 50 MG tablet, Take 50 mg by mouth daily., Disp: , Rfl:  .  Multiple Vitamins-Minerals (EYE VITAMINS PO), Take 1 tablet by mouth daily. , Disp: , Rfl:  .  neomycin-polymyxin b-dexamethasone (MAXITROL) 3.5-10000-0.1 SUSP, INSTILL 1 DROP INTO THE AFFECTED EYE TWICE A DAY FOR 1 WEEK, THEN ONCE DAILY FOR 1 WEEK AS DIRECTED, Disp: , Rfl:  .  oxyCODONE-acetaminophen (PERCOCET) 10-325 MG tablet, Take 1 tablet by mouth every 6 (six) hours as needed for up to 8 days for pain., Disp: 30 tablet, Rfl: 0 .  Propylene Glycol (SYSTANE COMPLETE  OP), Place 1 drop into both eyes 2 (two) times daily as needed (dry eyes)., Disp: , Rfl:  .  rosuvastatin (CRESTOR) 20 MG tablet, TAKE ONE TABLET BY MOUTH EVERY DAY, Disp: 90 tablet, Rfl: 0 .  sodium chloride (MURO 128) 5 % ophthalmic solution, Place 1 drop into the left eye 2 (two) times a day., Disp: , Rfl:  .  traMADol (ULTRAM) 50 MG tablet, Take 50 mg by mouth every 6 (six) hours as needed for moderate pain. , Disp: , Rfl:  .  doxycycline (VIBRA-TABS) 100 MG tablet, Take 1 tablet (100 mg total) by mouth 2 (two) times daily., Disp: 20 tablet, Rfl: 0 .  hydrochlorothiazide (HYDRODIURIL) 25 MG tablet, Take 25 mg by mouth daily. , Disp: , Rfl:   Social History   Tobacco Use  Smoking Status Former Smoker  . Packs/day: 0.75  . Years: 45.00  . Pack years: 33.75  . Types: Cigarettes  . Quit date: 07/2018  . Years since quitting: 1.8  Smokeless Tobacco Never Used  Tobacco Comment   Uses nicotine patch    Allergies  Allergen Reactions  . Macrodantin [Nitrofurantoin Macrocrystal] Hives  . Penicillins Other (See Comments)    Did it involve swelling of the face/tongue/throat, SOB, or low BP? Unknown Did it involve sudden or severe rash/hives, skin peeling, or any reaction on the inside of your mouth or nose? Unknown Did you need to seek medical attention at a hospital or doctor's office? Unknown When did it last happen? as a child If all above answers are "NO", may proceed with cephalosporin use.    Objective:  There were no vitals filed for this visit. There is no height or weight on file to calculate BMI. Constitutional Well developed. Well nourished.  Vascular Foot warm and well perfused. Capillary refill normal to all digits.   Neurologic Normal speech. Oriented to person, place, and time. Epicritic sensation to light touch grossly present bilaterally.  Dermatologic Skin healing well without signs of infection. Skin edges well coapted without signs of infection.  Orthopedic:  Tenderness to palpation noted about the surgical site.   Radiographs: 3 views of skeletally mature adult right foot: Hardware is intact.  No signs of loosening noted.  The fusion site is consolidating well.  There appears to be a slightly space increase in the fusion site likely due to the fall. Assessment:   1. Arthritis of first metatarsophalangeal (MTP) joint of right foot   2. Status post foot surgery    Plan:  Patient was evaluated and treated and all questions answered.  S/p foot surgery right -Progressing as  expected post-operatively. -XR: See above -WB Status: Nonweightbearing in right -Sutures: Intact.  No clinical signs of dehiscence noted.  Mild erythema present we will plan on giving doxycycline for skin and soft tissue prophylaxis -Medications: Doxycycline -Foot redressed.  No follow-ups on file.

## 2020-06-04 DIAGNOSIS — G4733 Obstructive sleep apnea (adult) (pediatric): Secondary | ICD-10-CM | POA: Diagnosis not present

## 2020-06-05 ENCOUNTER — Encounter: Payer: Self-pay | Admitting: Podiatry

## 2020-06-05 ENCOUNTER — Ambulatory Visit (INDEPENDENT_AMBULATORY_CARE_PROVIDER_SITE_OTHER): Payer: PPO | Admitting: Podiatry

## 2020-06-05 ENCOUNTER — Other Ambulatory Visit: Payer: Self-pay

## 2020-06-05 DIAGNOSIS — Z9889 Other specified postprocedural states: Secondary | ICD-10-CM

## 2020-06-05 DIAGNOSIS — M19071 Primary osteoarthritis, right ankle and foot: Secondary | ICD-10-CM

## 2020-06-05 NOTE — Progress Notes (Signed)
Subjective:  Patient ID: Janet Osborne, female    DOB: [birth year],  MRN: [id number]  Chief Complaint  Patient presents with  . Routine Post Op    POV #2 DOS 05/21/2020 RT FIRST MPJ FUSION     69 y.o. female returns for post-op check.  Patient states she is doing well.  She does not have any pain.  She has been nonweightbearing with a knee scooter.  She has been keeping bandages clean dry and intact.  She denies any other acute complaints.  Review of Systems: Negative except as noted in the HPI. Denies N/V/F/Ch.  Past Medical History:  Diagnosis Date  . Abdominal hernia   . Anemia   . Anxiety   . Chronic constipation   . Colon polyps   . Complication of anesthesia    coughing episodes after colonoscopy and sugical procedure 2011 (hx copd)  . COPD (chronic obstructive pulmonary disease) ([facility])   . DDD (degenerative disc disease), cervical   . Depression   . Endometriosis   . Essential hypertension, benign   . Fibrocystic breast disease   . GERD (gastroesophageal reflux disease)   . H/O hand surgery 03/17/2017  . Hemorrhoids   . Hyperlipidemia   . Hypothyroidism   . Migraine   . Nontoxic multinodular goiter   . Osteoarthrosis involving, or with mention of more than one site, but not specified as generalized, multiple sites   . Tobacco use     Current Outpatient Medications:  .  acetaminophen (TYLENOL) 500 MG tablet, Take 500 mg by mouth 2 (two) times a day., Disp: , Rfl:  .  albuterol (VENTOLIN HFA) 108 (90 Base) MCG/ACT inhaler, Inhale 1-2 puffs into the lungs every 6 (six) hours as needed for wheezing or shortness of breath., Disp: , Rfl:  .  aspirin 81 MG chewable tablet, Chew 81 mg by mouth daily., Disp: , Rfl:  .  budesonide-formoterol (SYMBICORT) 160-4.5 MCG/ACT inhaler, Inhale 2 puffs into the lungs 2 (two) times daily., Disp: , Rfl:  .  Calcium Carbonate-Vit D-Min (CALTRATE 600+D PLUS MINERALS) 600-800 MG-UNIT CHEW, Chew 1 tablet by mouth daily., Disp: , Rfl:  .   Cholecalciferol (VITAMIN D) 50 MCG (2000 UT) CAPS, Take 2,000 Units by mouth at bedtime., Disp: , Rfl:  .  Cyanocobalamin (VITAMIN B-12) 5000 MCG TBDP, Take 5,000 mcg by mouth at bedtime., Disp: , Rfl:  .  diazepam (VALIUM) 5 MG tablet, Take 5 mg by mouth every 6 (six) hours as needed for anxiety., Disp: , Rfl:  .  Docusate Sodium (DSS) 100 MG CAPS, Take by mouth., Disp: , Rfl:  .  doxycycline (VIBRA-TABS) 100 MG tablet, Take 1 tablet (100 mg total) by mouth 2 (two) times daily., Disp: 20 tablet, Rfl: 0 .  doxycycline (VIBRAMYCIN) 100 MG capsule, Take 100 mg by mouth 2 (two) times daily., Disp: , Rfl:  .  esomeprazole (NEXIUM) 40 MG capsule, Take 40 mg by mouth daily at 12 noon., Disp: , Rfl:  .  estradiol (ESTRACE) 0.5 MG tablet, Take 0.5 mg by mouth at bedtime., Disp: , Rfl:  .  gabapentin (NEURONTIN) 300 MG capsule, Take 300 mg by mouth daily. Take 300mg  in the AM, and 600mg  at bedtime., Disp: , Rfl:  .  ibuprofen (ADVIL) 800 MG tablet, Take 1 tablet (800 mg total) by mouth every 6 (six) hours as needed., Disp: 60 tablet, Rfl: 1 .  levothyroxine (SYNTHROID) 100 MCG tablet, Take 100 mcg by mouth daily before breakfast., Disp: , Rfl:  .  loratadine (CLARITIN REDITABS) 10 MG dissolvable tablet, Take 10 mg by mouth daily as needed for allergies., Disp: , Rfl:  .  losartan (COZAAR) 50 MG tablet, Take 50 mg by mouth daily., Disp: , Rfl:  .  Multiple Vitamins-Minerals (EYE VITAMINS PO), Take 1 tablet by mouth daily. , Disp: , Rfl:  .  neomycin-polymyxin b-dexamethasone (MAXITROL) 3.5-10000-0.1 SUSP, INSTILL 1 DROP INTO THE AFFECTED EYE TWICE A DAY FOR 1 WEEK, THEN ONCE DAILY FOR 1 WEEK AS DIRECTED, Disp: , Rfl:  .  Propylene Glycol (SYSTANE COMPLETE OP), Place 1 drop into both eyes 2 (two) times daily as needed (dry eyes)., Disp: , Rfl:  .  rosuvastatin (CRESTOR) 20 MG tablet, TAKE ONE TABLET BY MOUTH EVERY DAY, Disp: 90 tablet, Rfl: 0 .  sodium chloride (MURO 128) 5 % ophthalmic solution, Place 1 drop  into the left eye 2 (two) times a day., Disp: , Rfl:  .  traMADol (ULTRAM) 50 MG tablet, Take 50 mg by mouth every 6 (six) hours as needed for moderate pain. , Disp: , Rfl:  .  hydrochlorothiazide (HYDRODIURIL) 25 MG tablet, Take 25 mg by mouth daily. , Disp: , Rfl:   Social History   Tobacco Use  Smoking Status Former Smoker  . Packs/day: 0.75  . Years: 45.00  . Pack years: 33.75  . Types: Cigarettes  . Quit date: 07/2018  . Years since quitting: 1.9  Smokeless Tobacco Never Used  Tobacco Comment   Uses nicotine patch    Allergies  Allergen Reactions  . Macrodantin [Nitrofurantoin Macrocrystal] Hives  . Penicillins Other (See Comments)    Did it involve swelling of the face/tongue/throat, SOB, or low BP? Unknown Did it involve sudden or severe rash/hives, skin peeling, or any reaction on the inside of your mouth or nose? Unknown Did you need to seek medical attention at a hospital or doctor's office? Unknown When did it last happen? as a child If all above answers are "NO", may proceed with cephalosporin use.    Objective:  There were no vitals filed for this visit. There is no height or weight on file to calculate BMI. Constitutional Well developed. Well nourished.  Vascular Foot warm and well perfused. Capillary refill normal to all digits.   Neurologic Normal speech. Oriented to person, place, and time. Epicritic sensation to light touch grossly present bilaterally.  Dermatologic Skin healing well without signs of infection. Skin edges well coapted without signs of infection.  Orthopedic: Tenderness to palpation noted about the surgical site.   Radiographs: None Assessment:   1. Arthritis of first metatarsophalangeal (MTP) joint of right foot   2. Status post foot surgery    Plan:  Patient was evaluated and treated and all questions answered.  S/p foot surgery right -Progressing as expected post-operatively. -XR: None -WB Status: Nonweightbearing in right  lower extremity knee scooter -Sutures: Removed.  No dehiscence noted.  No complication noted. -Medications: None -Foot redressed.  No follow-ups on file.

## 2020-06-13 ENCOUNTER — Ambulatory Visit (INDEPENDENT_AMBULATORY_CARE_PROVIDER_SITE_OTHER): Payer: PPO | Admitting: Orthotics

## 2020-06-13 ENCOUNTER — Other Ambulatory Visit: Payer: Self-pay

## 2020-06-13 DIAGNOSIS — Z9889 Other specified postprocedural states: Secondary | ICD-10-CM

## 2020-06-13 DIAGNOSIS — Q666 Other congenital valgus deformities of feet: Secondary | ICD-10-CM

## 2020-06-13 DIAGNOSIS — M19071 Primary osteoarthritis, right ankle and foot: Secondary | ICD-10-CM

## 2020-06-13 NOTE — Progress Notes (Signed)
Patient came in today to pick up custom made foot orthotics.  The goals were accomplished and the patient reported no dissatisfaction with said orthotics.  Patient was advised of breakin period and how to report any issues. 

## 2020-06-19 ENCOUNTER — Ambulatory Visit (INDEPENDENT_AMBULATORY_CARE_PROVIDER_SITE_OTHER): Payer: PPO

## 2020-06-19 ENCOUNTER — Encounter: Payer: Self-pay | Admitting: Podiatry

## 2020-06-19 ENCOUNTER — Ambulatory Visit (INDEPENDENT_AMBULATORY_CARE_PROVIDER_SITE_OTHER): Payer: PPO | Admitting: Podiatry

## 2020-06-19 ENCOUNTER — Other Ambulatory Visit: Payer: Self-pay

## 2020-06-19 DIAGNOSIS — Z9889 Other specified postprocedural states: Secondary | ICD-10-CM | POA: Diagnosis not present

## 2020-06-19 DIAGNOSIS — Z91199 Patient's noncompliance with other medical treatment and regimen due to unspecified reason: Secondary | ICD-10-CM

## 2020-06-19 DIAGNOSIS — Z9119 Patient's noncompliance with other medical treatment and regimen: Secondary | ICD-10-CM

## 2020-06-19 DIAGNOSIS — M19071 Primary osteoarthritis, right ankle and foot: Secondary | ICD-10-CM

## 2020-06-20 ENCOUNTER — Encounter: Payer: Self-pay | Admitting: Podiatry

## 2020-06-20 NOTE — Progress Notes (Signed)
Subjective:  Patient ID: Janet Osborne, female    DOB: 1951/02/08,  MRN: [id number]  Chief Complaint  Patient presents with   Routine Post Op    POV #3 DOS 05/21/2020 RT FIRST MPJ FUSION  "its doing better"     69 y.o. female returns for post-op check.  Patient states she is doing well.  She does not have any pain.  She has been weightbearing as tolerated in a cam boot with occasional nonweightbearing.  She states that she had a lot of things to do and she was not able to be complete nonweightbearing.  She states that she is doing better she does not have any pain.  Review of Systems: Negative except as noted in the HPI. Denies N/V/F/Ch.  Past Medical History:  Diagnosis Date   Abdominal hernia    Anemia    Anxiety    Chronic constipation    Colon polyps    Complication of anesthesia    coughing episodes after colonoscopy and sugical procedure 2011 (hx copd)   COPD (chronic obstructive pulmonary disease) ([facility])    DDD (degenerative disc disease), cervical    Depression    Endometriosis    Essential hypertension, benign    Fibrocystic breast disease    GERD (gastroesophageal reflux disease)    H/O hand surgery 03/17/2017   Hemorrhoids    Hyperlipidemia    Hypothyroidism    Migraine    Nontoxic multinodular goiter    Osteoarthrosis involving, or with mention of more than one site, but not specified as generalized, multiple sites    Tobacco use     Current Outpatient Medications:    acetaminophen (TYLENOL) 500 MG tablet, Take 500 mg by mouth 2 (two) times a day., Disp: , Rfl:    albuterol (VENTOLIN HFA) 108 (90 Base) MCG/ACT inhaler, Inhale 1-2 puffs into the lungs every 6 (six) hours as needed for wheezing or shortness of breath., Disp: , Rfl:    aspirin 81 MG chewable tablet, Chew 81 mg by mouth daily., Disp: , Rfl:    budesonide-formoterol (SYMBICORT) 160-4.5 MCG/ACT inhaler, Inhale 2 puffs into the lungs 2 (two) times daily., Disp: , Rfl:     Calcium Carbonate-Vit D-Min (CALTRATE 600+D PLUS MINERALS) 600-800 MG-UNIT CHEW, Chew 1 tablet by mouth daily., Disp: , Rfl:    Cholecalciferol (VITAMIN D) 50 MCG (2000 UT) CAPS, Take 2,000 Units by mouth at bedtime., Disp: , Rfl:    Cyanocobalamin (VITAMIN B-12) 5000 MCG TBDP, Take 5,000 mcg by mouth at bedtime., Disp: , Rfl:    diazepam (VALIUM) 5 MG tablet, Take 5 mg by mouth every 6 (six) hours as needed for anxiety., Disp: , Rfl:    Docusate Sodium (DSS) 100 MG CAPS, Take by mouth., Disp: , Rfl:    doxycycline (VIBRA-TABS) 100 MG tablet, Take 1 tablet (100 mg total) by mouth 2 (two) times daily., Disp: 20 tablet, Rfl: 0   doxycycline (VIBRAMYCIN) 100 MG capsule, Take 100 mg by mouth 2 (two) times daily., Disp: , Rfl:    esomeprazole (NEXIUM) 40 MG capsule, Take 40 mg by mouth daily at 12 noon., Disp: , Rfl:    estradiol (ESTRACE) 0.5 MG tablet, Take 0.5 mg by mouth at bedtime., Disp: , Rfl:    gabapentin (NEURONTIN) 300 MG capsule, Take 300 mg by mouth daily. Take 300mg  in the AM, and 600mg  at bedtime., Disp: , Rfl:    hydrochlorothiazide (HYDRODIURIL) 25 MG tablet, Take 25 mg by mouth daily. , Disp: , Rfl:  ibuprofen (ADVIL) 800 MG tablet, Take 1 tablet (800 mg total) by mouth every 6 (six) hours as needed., Disp: 60 tablet, Rfl: 1   levothyroxine (SYNTHROID) 100 MCG tablet, Take 100 mcg by mouth daily before breakfast., Disp: , Rfl:    loratadine (CLARITIN REDITABS) 10 MG dissolvable tablet, Take 10 mg by mouth daily as needed for allergies., Disp: , Rfl:    losartan (COZAAR) 50 MG tablet, Take 50 mg by mouth daily., Disp: , Rfl:    Multiple Vitamins-Minerals (EYE VITAMINS PO), Take 1 tablet by mouth daily. , Disp: , Rfl:    neomycin-polymyxin b-dexamethasone (MAXITROL) 3.5-10000-0.1 SUSP, INSTILL 1 DROP INTO THE AFFECTED EYE TWICE A DAY FOR 1 WEEK, THEN ONCE DAILY FOR 1 WEEK AS DIRECTED, Disp: , Rfl:    Propylene Glycol (SYSTANE COMPLETE OP), Place 1 drop into both eyes 2  (two) times daily as needed (dry eyes)., Disp: , Rfl:    rosuvastatin (CRESTOR) 20 MG tablet, TAKE ONE TABLET BY MOUTH EVERY DAY, Disp: 90 tablet, Rfl: 0   sodium chloride (MURO 128) 5 % ophthalmic solution, Place 1 drop into the left eye 2 (two) times a day., Disp: , Rfl:    traMADol (ULTRAM) 50 MG tablet, Take 50 mg by mouth every 6 (six) hours as needed for moderate pain. , Disp: , Rfl:   Social History   Tobacco Use  Smoking Status Former Smoker   Packs/day: 0.75   Years: 45.00   Pack years: 33.75   Types: Cigarettes   Quit date: 07/2018   Years since quitting: 1.9  Smokeless Tobacco Never Used  Tobacco Comment   Uses nicotine patch    Allergies  Allergen Reactions   Macrodantin [Nitrofurantoin Macrocrystal] Hives   Penicillins Other (See Comments)    Did it involve swelling of the face/tongue/throat, SOB, or low BP? Unknown Did it involve sudden or severe rash/hives, skin peeling, or any reaction on the inside of your mouth or nose? Unknown Did you need to seek medical attention at a hospital or doctor's office? Unknown When did it last happen? as a child If all above answers are "NO", may proceed with cephalosporin use.    Objective:  There were no vitals filed for this visit. There is no height or weight on file to calculate BMI. Constitutional Well developed. Well nourished.  Vascular Foot warm and well perfused. Capillary refill normal to all digits.   Neurologic Normal speech. Oriented to person, place, and time. Epicritic sensation to light touch grossly present bilaterally.  Dermatologic Skin healing well without signs of infection. Skin edges well coapted without signs of infection.  Orthopedic: Tenderness to palpation noted about the surgical site.   Radiographs: 3 views of skeletally mature adult foot right: There is an increase in gapping in the fusion site likely due to noncompliance and weightbearing.  No consolidation noted.  Good alignment  of the fusion as well as plates and screws noted.  Hardware is intact. Assessment:   1. Status post foot surgery   2. Arthritis of first metatarsophalangeal (MTP) joint of right foot   3. Noncompliance    Plan:  Patient was evaluated and treated and all questions answered.  S/p foot surgery right -Progressing as expected post-operatively. -XR: None -WB Status: Continue nonweightbearing to the right lower extremity knee scooter.  However patient has been ambulating with a cam boot despite my recommendations. -Sutures: None -Medications: None -Patient can start showering the foot at this time.  No restrictions.  There is  an increase in gapping at the fusion site which may prevent the patient from fusing completely and may lead to nonunion.  I discussed this with the patient in extensive detail.  We will continue to monitor over the next couple of months.  If it continues to get more painful or if the fusion site is not healing we may have to redo the fusion and put put in a graft.  No follow-ups on file.

## 2020-06-22 ENCOUNTER — Other Ambulatory Visit: Payer: Self-pay

## 2020-06-22 ENCOUNTER — Encounter: Payer: Self-pay | Admitting: Internal Medicine

## 2020-06-22 ENCOUNTER — Ambulatory Visit: Payer: PPO | Admitting: Internal Medicine

## 2020-06-22 VITALS — BP 134/78 | HR 60 | Ht 61.5 in | Wt 152.0 lb

## 2020-06-22 DIAGNOSIS — E785 Hyperlipidemia, unspecified: Secondary | ICD-10-CM | POA: Diagnosis not present

## 2020-06-22 DIAGNOSIS — I10 Essential (primary) hypertension: Secondary | ICD-10-CM | POA: Diagnosis not present

## 2020-06-22 DIAGNOSIS — I2510 Atherosclerotic heart disease of native coronary artery without angina pectoris: Secondary | ICD-10-CM

## 2020-06-22 DIAGNOSIS — R0602 Shortness of breath: Secondary | ICD-10-CM | POA: Diagnosis not present

## 2020-06-22 NOTE — Patient Instructions (Signed)

## 2020-06-22 NOTE — Progress Notes (Signed)
Follow-up Outpatient Visit Date: 06/22/2020  Primary Care Provider: Idelle Crouch, MD [place] [address]  Chief Complaint: Follow-up shortness of breath and nonobstructive coronary artery disease  HPI:  Janet Osborne is a 69 y.o. female with history of nonobstructive coronary artery disease by cardiac CTA, hypertension, hyperlipidemia, COPD, and thyroid disease, who presents for follow-up of coronary artery disease.  We last spoke via virtual visit in January, at which time Janet Osborne reported some shortness of breath, albeit better compared to prior visits.  She also noted occasional "soreness" under the left breast.  Today, Janet Osborne reports feeling about the same as at our last visit.  She has been struggling with foot problems and is still recovering from right foot/ankle surgery.  She is concerned that she may need to undergo further surgery due to poor fusion.  Janet Osborne notes stable chron exertional dyspnea.  She also has intermittent discomfort underneath her left breast, similar to what she has described in the past.  She wakes up at times at night, feeling as though she swallowed something wrong.  It feels different than her acid reflux, for which she remains on twice daily Nexium.  Janet Osborne continues to struggle with CPAP and only wears it about 4 hours a night.  Janet Osborne is scheduled for colonoscopy next month.  --------------------------------------------------------------------------------------------------  Cardiovascular History & Procedures: Cardiovascular Problems:  Dyspnea on exertion  Atypical chest pain  Risk Factors:  Hypertension, hyperlipidemia, prior tobacco use, and age greater than 54  Cath/PCI:  None  CV Surgery:  None  EP Procedures and Devices:  None  Non-Invasive Evaluation(s):  Cardiac CTA (07/13/2019): LMCA normal.  LAD with mixed proximal and mid vessel disease with stenosis up to 51 to 69%  (CT FFR 0.86).  LCx and RCA normal.  Coronary calcium score 191 Agatston units.  Aortic atherosclerosis noted.  Exercise stress echocardiogram (09/15/2018, [facility]): Normal baseline LVEF (greater than 55%) with hyperdynamic function with stress.  No regional wall motion abnormality.  Trivial aortic regurgitation.  Mild mitral and tricuspid regurgitation.  Recent CV Pertinent Labs: Lab Results  Component Value Date   K 4.2 07/01/2019   BUN 10 07/01/2019   CREATININE 0.67 07/01/2019    Past medical and surgical history were reviewed and updated in EPIC.  Current Meds  Medication Sig  . acetaminophen (TYLENOL) 500 MG tablet Take 500 mg by mouth 2 (two) times a day.  . albuterol (VENTOLIN HFA) 108 (90 Base) MCG/ACT inhaler Inhale 1-2 puffs into the lungs every 6 (six) hours as needed for wheezing or shortness of breath.  Marland Kitchen aspirin 81 MG chewable tablet Chew 81 mg by mouth daily.  . budesonide-formoterol (SYMBICORT) 160-4.5 MCG/ACT inhaler Inhale 2 puffs into the lungs 2 (two) times daily.  . Calcium Carbonate-Vit D-Min (CALTRATE 600+D PLUS MINERALS) 600-800 MG-UNIT CHEW Chew 1 tablet by mouth daily.  . Cyanocobalamin (VITAMIN B-12) 5000 MCG TBDP Take 5,000 mcg by mouth at bedtime.  . diazepam (VALIUM) 5 MG tablet Take 5 mg by mouth every 6 (six) hours as needed for anxiety.  Mariane Baumgarten Sodium (DSS) 100 MG CAPS Take by mouth.  . esomeprazole (NEXIUM) 40 MG capsule Take 40 mg by mouth daily at 12 noon.  Marland Kitchen estradiol (ESTRACE) 0.5 MG tablet Take 0.5 mg by mouth at bedtime.  . gabapentin (NEURONTIN) 300 MG capsule Take 300 mg by mouth daily. Take 359m in the AM, and 6026mat bedtime.  .Marland Kitchen  hydrochlorothiazide (HYDRODIURIL) 25 MG tablet Take 25 mg by mouth daily.   Marland Kitchen ibuprofen (ADVIL) 800 MG tablet Take 1 tablet (800 mg total) by mouth every 6 (six) hours as needed.  Marland Kitchen levothyroxine (SYNTHROID) 100 MCG tablet Take 100 mcg by mouth daily before breakfast.  . loratadine (CLARITIN REDITABS) 10  MG dissolvable tablet Take 10 mg by mouth daily as needed for allergies.  Marland Kitchen losartan (COZAAR) 50 MG tablet Take 50 mg by mouth daily.  . Multiple Vitamins-Minerals (EYE VITAMINS PO) Take 1 tablet by mouth daily.   Marland Kitchen neomycin-polymyxin b-dexamethasone (MAXITROL) 3.5-10000-0.1 SUSP INSTILL 1 DROP INTO THE AFFECTED EYE TWICE A DAY FOR 1 WEEK, THEN ONCE DAILY FOR 1 WEEK AS DIRECTED  . Propylene Glycol (SYSTANE COMPLETE OP) Place 1 drop into both eyes 2 (two) times daily as needed (dry eyes).  . rosuvastatin (CRESTOR) 20 MG tablet TAKE ONE TABLET BY MOUTH EVERY DAY  . sodium chloride (MURO 128) 5 % ophthalmic solution Place 1 drop into the left eye 2 (two) times a day.  . traMADol (ULTRAM) 50 MG tablet Take 50 mg by mouth every 6 (six) hours as needed for moderate pain.     Allergies: Macrodantin [nitrofurantoin macrocrystal] and Penicillins  Social History   Tobacco Use  . Smoking status: Former Smoker    Packs/day: 0.75    Years: 45.00    Pack years: 33.75    Types: Cigarettes    Quit date: 07/2018    Years since quitting: 1.9  . Smokeless tobacco: Never Used  . Tobacco comment: Uses nicotine patch  Vaping Use  . Vaping Use: Never used  Substance Use Topics  . Alcohol use: Yes    Comment: occassional  . Drug use: No    Family History  Problem Relation Age of Onset  . Hypertension Mother   . Clotting disorder Mother   . Diabetes Mother   . Dementia Mother   . Osteoarthritis Mother   . Gallbladder disease Mother   . Heart disease Mother   . Hypertension Father   . Prostate cancer Father   . COPD Father   . Osteoarthritis Father   . Gallbladder disease Father   . Colon polyps Father   . Osteoarthritis Sister   . Thyroid disease Sister   . HIV Brother   . Breast cancer Neg Hx     Review of Systems: A 12-system review of systems was performed and was negative except as noted in the  HPI.  --------------------------------------------------------------------------------------------------  Physical Exam: BP 134/78 (BP Location: Right Arm, Patient Position: Sitting, Cuff Size: Normal)   Pulse 60   Ht 5' 1.5" (1.562 m)   Wt 152 lb (68.9 kg)   SpO2 98%   BMI 28.26 kg/m   General: NAD.  Accompanied by her husband. Neck: No JVD or HJR. Lungs: Mildly diminished breath sounds throughout without wheezes or crackles. Heart: Regular rate and rhythm without murmurs, rubs, or gallops. Abdomen: Soft, nontender, nondistended. Extremities: No lower extremity edema.  Orthopedic brace noted on the right calf, ankle, and foot.  EKG: Normal sinus rhythm with borderline low voltage.  Otherwise, no significant abnormality.  No significant change from prior tracing on 07/01/2019.  Outside labs: CBC: White blood cell count 7.3, hemoglobin 14.0, hematocrit 41.1, platelets 227  CMP: Sodium 140, potassium 4.2, chloride 103, CO2 31, BUN 15, creatinine 0.8, glucose 100, calcium 10.1, AST 19, ALT 16, alk phosphatase 60, total bilirubin 0.5, total protein 6.7, albumin 4.1  Lipid panel: Total  cholesterol 135, triglycerides 180, HDL 56, LDL 43  TSH: 2.04  Hemoglobin A1c: 6.0 (04/16/2020)  --------------------------------------------------------------------------------------------------  ASSESSMENT AND PLAN: Nonobstructive coronary artery disease and shortness of breath: Overall, Janet Osborne reports relatively stable symptoms.  Should be noted that her activity has been limited by orthopedic issues.  Cardiac CTA last year showed moderate proximal/mid LAD disease that was not hemodynamically significant by CT FFR.  Given stable symptoms, we have agreed to defer additional testing at this time and to focus on medical therapy to prevent progression of disease.  Were she had to have worsening shortness of breath or chest pain, which remains atypical, we would need to consider further evaluation  (likely catheterization).  We will continue with aspirin 81 mg daily and rosuvastatin 20 mg daily.  Janet Osborne notes that she has an upcoming colonoscopy.  I think it is reasonable for her to proceed without additional cardiac testing or intervention for this low risk procedure.  If needed, aspirin can be held 7 days before the procedure and restarted afterwards when felt to be safe by GI.  Hypertension: Blood pressure upper normal today.  No medication changes today.  Hyperlipidemia: Most recent lipid panel with Dr. Doy Hutching in May showed excellent LDL of 43.  We will continue with rosuvastatin 20 mg daily.  Follow-up: Return to clinic in 6 months.  [name], MD 06/23/2020 11:30 AM

## 2020-06-23 ENCOUNTER — Encounter: Payer: Self-pay | Admitting: Internal Medicine

## 2020-07-04 DIAGNOSIS — G4733 Obstructive sleep apnea (adult) (pediatric): Secondary | ICD-10-CM | POA: Diagnosis not present

## 2020-07-09 ENCOUNTER — Ambulatory Visit
Admission: RE | Admit: 2020-07-09 | Discharge: 2020-07-09 | Disposition: A | Discharge status: Home / Self Care | Payer: PPO | Source: Ambulatory Visit | Attending: Internal Medicine | Admitting: Internal Medicine

## 2020-07-09 DIAGNOSIS — Z1231 Encounter for screening mammogram for malignant neoplasm of breast: Secondary | ICD-10-CM | POA: Diagnosis not present

## 2020-07-12 ENCOUNTER — Telehealth: Payer: Self-pay

## 2020-07-12 DIAGNOSIS — Z122 Encounter for screening for malignant neoplasm of respiratory organs: Secondary | ICD-10-CM

## 2020-07-12 DIAGNOSIS — Z87891 Personal history of nicotine dependence: Secondary | ICD-10-CM

## 2020-07-12 NOTE — Telephone Encounter (Signed)
Message left notifying patient that it is time to schedule the low dose lung cancer screening CT scan.  Instructed patient to return call to Shawn Perkins at [phone] to verify information prior to CT scan being scheduled.    

## 2020-07-13 NOTE — Telephone Encounter (Signed)
Contacted and scheduled 

## 2020-07-13 NOTE — Addendum Note (Signed)
Addended by: Lieutenant Diego on: 07/13/2020 12:59 PM   Modules accepted: Orders

## 2020-07-17 ENCOUNTER — Other Ambulatory Visit: Payer: Self-pay

## 2020-07-17 ENCOUNTER — Ambulatory Visit (INDEPENDENT_AMBULATORY_CARE_PROVIDER_SITE_OTHER): Payer: PPO

## 2020-07-17 ENCOUNTER — Ambulatory Visit (INDEPENDENT_AMBULATORY_CARE_PROVIDER_SITE_OTHER): Payer: PPO | Admitting: Podiatry

## 2020-07-17 ENCOUNTER — Encounter: Payer: Self-pay | Admitting: Podiatry

## 2020-07-17 DIAGNOSIS — Z9889 Other specified postprocedural states: Secondary | ICD-10-CM

## 2020-07-17 DIAGNOSIS — M19071 Primary osteoarthritis, right ankle and foot: Secondary | ICD-10-CM

## 2020-07-17 NOTE — Progress Notes (Signed)
Subjective:  Patient ID: Janet Osborne, female    DOB: 09/10/1951,  MRN: [id number]  No chief complaint on file.    69 y.o. female returns for post-op check.  Patient states she is doing well.  She has been ambulating with cam boot in regular sneakers.  She denies any acute pain.  She has been able to return to normal activities without any restrictions. Review of Systems: Negative except as noted in the HPI. Denies N/V/F/Ch.  Past Medical History:  Diagnosis Date  . Abdominal hernia   . Anemia   . Anxiety   . Chronic constipation   . Colon polyps   . Complication of anesthesia    coughing episodes after colonoscopy and sugical procedure 2011 (hx copd)  . COPD (chronic obstructive pulmonary disease) ([place])   . DDD (degenerative disc disease), cervical   . Depression   . Endometriosis   . Essential hypertension, benign   . Fibrocystic breast disease   . GERD (gastroesophageal reflux disease)   . H/O hand surgery 03/17/2017  . Hemorrhoids   . Hyperlipidemia   . Hypothyroidism   . Migraine   . Nontoxic multinodular goiter   . Osteoarthrosis involving, or with mention of more than one site, but not specified as generalized, multiple sites   . Tobacco use     Current Outpatient Medications:  .  acetaminophen (TYLENOL) 500 MG tablet, Take 500 mg by mouth 2 (two) times a day., Disp: , Rfl:  .  albuterol (VENTOLIN HFA) 108 (90 Base) MCG/ACT inhaler, Inhale 1-2 puffs into the lungs every 6 (six) hours as needed for wheezing or shortness of breath., Disp: , Rfl:  .  aspirin 81 MG chewable tablet, Chew 81 mg by mouth daily., Disp: , Rfl:  .  budesonide-formoterol (SYMBICORT) 160-4.5 MCG/ACT inhaler, Inhale 2 puffs into the lungs 2 (two) times daily., Disp: , Rfl:  .  Cholecalciferol (VITAMIN D) 50 MCG (2000 UT) CAPS, Take 2,000 Units by mouth at bedtime. (Patient not taking: Reported on 06/22/2020), Disp: , Rfl:  .  Cyanocobalamin (VITAMIN B-12) 5000 MCG TBDP, Take 5,000 mcg by  mouth at bedtime., Disp: , Rfl:  .  diazepam (VALIUM) 5 MG tablet, Take 5 mg by mouth every 6 (six) hours as needed for anxiety., Disp: , Rfl:  .  Docusate Sodium (DSS) 100 MG CAPS, Take by mouth., Disp: , Rfl:  .  esomeprazole (NEXIUM) 40 MG capsule, Take 40 mg by mouth daily at 12 noon., Disp: , Rfl:  .  estradiol (ESTRACE) 0.5 MG tablet, Take 0.5 mg by mouth at bedtime., Disp: , Rfl:  .  gabapentin (NEURONTIN) 300 MG capsule, Take 300 mg by mouth daily. Take 300mg  in the AM, and 600mg  at bedtime., Disp: , Rfl:  .  hydrochlorothiazide (HYDRODIURIL) 25 MG tablet, Take 25 mg by mouth daily. , Disp: , Rfl:  .  ibuprofen (ADVIL) 800 MG tablet, Take 1 tablet (800 mg total) by mouth every 6 (six) hours as needed., Disp: 60 tablet, Rfl: 1 .  levothyroxine (SYNTHROID) 100 MCG tablet, Take 100 mcg by mouth daily before breakfast., Disp: , Rfl:  .  loratadine (CLARITIN REDITABS) 10 MG dissolvable tablet, Take 10 mg by mouth daily as needed for allergies., Disp: , Rfl:  .  losartan (COZAAR) 50 MG tablet, Take 50 mg by mouth daily., Disp: , Rfl:  .  Multiple Vitamins-Minerals (EYE VITAMINS PO), Take 1 tablet by mouth daily. , Disp: , Rfl:  .  Propylene Glycol (SYSTANE  COMPLETE OP), Place 1 drop into both eyes 2 (two) times daily as needed (dry eyes)., Disp: , Rfl:  .  rosuvastatin (CRESTOR) 20 MG tablet, TAKE ONE TABLET BY MOUTH EVERY DAY, Disp: 90 tablet, Rfl: 0 .  sodium chloride (MURO 128) 5 % ophthalmic solution, Place 1 drop into the left eye 2 (two) times a day., Disp: , Rfl:  .  traMADol (ULTRAM) 50 MG tablet, Take 50 mg by mouth every 6 (six) hours as needed for moderate pain. , Disp: , Rfl:   Social History   Tobacco Use  Smoking Status Former Smoker  . Packs/day: 0.75  . Years: 45.00  . Pack years: 33.75  . Types: Cigarettes  . Quit date: 07/2018  . Years since quitting: 2.0  Smokeless Tobacco Never Used  Tobacco Comment   Uses nicotine patch    Allergies  Allergen Reactions  .  Macrodantin [Nitrofurantoin Macrocrystal] Hives  . Penicillins Other (See Comments)    Did it involve swelling of the face/tongue/throat, SOB, or low BP? Unknown Did it involve sudden or severe rash/hives, skin peeling, or any reaction on the inside of your mouth or nose? Unknown Did you need to seek medical attention at a hospital or doctor's office? Unknown When did it last happen? as a child If all above answers are "NO", may proceed with cephalosporin use.    Objective:  There were no vitals filed for this visit. There is no height or weight on file to calculate BMI. Constitutional Well developed. Well nourished.  Vascular Foot warm and well perfused. Capillary refill normal to all digits.   Neurologic Normal speech. Oriented to person, place, and time. Epicritic sensation to light touch grossly present bilaterally.  Dermatologic  skin completely reepithelialized without any dehiscence.  No pain on palpation to the incision site or with underlying hardware.  No protuberance of the hardware noted.  Orthopedic:  No tenderness to palpation noted about the surgical site.   Radiographs: 3 views of skeletally mature adult foot right: There is an increase in gapping in the fusion site likely due to noncompliance and weightbearing.  No consolidation noted.  Good alignment of the fusion as well as plates and screws noted.  Hardware is intact. Assessment:   1. Status post foot surgery   2. Arthritis of first metatarsophalangeal (MTP) joint of right foot    Plan:  Patient was evaluated and treated and all questions answered.  S/p foot surgery right -Progressing as expected post-operatively. -XR: None -WB Status: Weightbearing as tolerated to the right lower extremity regular sneakers. -Sutures: None -Medications: None -Patient can start showering the foot at this time.  No restrictions.  The new x-rays show that there is no closing of the gapping noted.  I will continue to monitor the  progression of it.  At this time patient clinically does not have any pain at the hardware appears to be holding intact without any signs of loosening or failure. -I will see her back in 2 months we will perform another x-ray. -We can even consider Morton's extension if she has any pain in the future with orthotics.  No follow-ups on file.

## 2020-07-20 ENCOUNTER — Ambulatory Visit
Admission: RE | Admit: 2020-07-20 | Discharge: 2020-07-20 | Disposition: A | Discharge status: Home / Self Care | Payer: PPO | Source: Ambulatory Visit | Attending: Oncology | Admitting: Oncology

## 2020-07-20 ENCOUNTER — Other Ambulatory Visit: Payer: Self-pay

## 2020-07-20 DIAGNOSIS — Z87891 Personal history of nicotine dependence: Secondary | ICD-10-CM | POA: Diagnosis not present

## 2020-07-20 DIAGNOSIS — Z122 Encounter for screening for malignant neoplasm of respiratory organs: Secondary | ICD-10-CM | POA: Diagnosis not present

## 2020-07-26 ENCOUNTER — Encounter: Payer: Self-pay | Admitting: *Deleted

## 2020-07-26 ENCOUNTER — Other Ambulatory Visit
Admission: RE | Admit: 2020-07-26 | Discharge: 2020-07-26 | Disposition: A | Discharge status: Home / Self Care | Payer: PPO | Source: Ambulatory Visit | Attending: Internal Medicine | Admitting: Internal Medicine

## 2020-07-26 ENCOUNTER — Other Ambulatory Visit: Payer: Self-pay

## 2020-07-26 DIAGNOSIS — Z20822 Contact with and (suspected) exposure to covid-19: Secondary | ICD-10-CM | POA: Insufficient documentation

## 2020-07-26 DIAGNOSIS — Z01812 Encounter for preprocedural laboratory examination: Secondary | ICD-10-CM | POA: Insufficient documentation

## 2020-07-27 ENCOUNTER — Encounter: Payer: Self-pay | Admitting: Internal Medicine

## 2020-07-27 LAB — SARS CORONAVIRUS 2 (TAT 6-24 HRS): SARS Coronavirus 2: NEGATIVE

## 2020-07-30 ENCOUNTER — Encounter: Payer: Self-pay | Admitting: Internal Medicine

## 2020-07-30 ENCOUNTER — Other Ambulatory Visit: Payer: Self-pay

## 2020-07-30 ENCOUNTER — Encounter
Admission: RE | Disposition: A | Discharge status: Home / Self Care | Payer: Self-pay | Source: Home / Self Care | Attending: Internal Medicine

## 2020-07-30 ENCOUNTER — Ambulatory Visit: Payer: PPO | Admitting: Certified Registered Nurse Anesthetist

## 2020-07-30 ENCOUNTER — Ambulatory Visit
Admission: RE | Admit: 2020-07-30 | Discharge: 2020-07-30 | Disposition: A | Discharge status: Home / Self Care | Payer: PPO | Attending: Internal Medicine | Admitting: Internal Medicine

## 2020-07-30 DIAGNOSIS — Z79890 Hormone replacement therapy (postmenopausal): Secondary | ICD-10-CM | POA: Insufficient documentation

## 2020-07-30 DIAGNOSIS — K5790 Diverticulosis of intestine, part unspecified, without perforation or abscess without bleeding: Secondary | ICD-10-CM | POA: Diagnosis not present

## 2020-07-30 DIAGNOSIS — K5730 Diverticulosis of large intestine without perforation or abscess without bleeding: Secondary | ICD-10-CM | POA: Insufficient documentation

## 2020-07-30 DIAGNOSIS — I2510 Atherosclerotic heart disease of native coronary artery without angina pectoris: Secondary | ICD-10-CM | POA: Diagnosis not present

## 2020-07-30 DIAGNOSIS — Z1211 Encounter for screening for malignant neoplasm of colon: Secondary | ICD-10-CM | POA: Insufficient documentation

## 2020-07-30 DIAGNOSIS — Z87891 Personal history of nicotine dependence: Secondary | ICD-10-CM | POA: Diagnosis not present

## 2020-07-30 DIAGNOSIS — F418 Other specified anxiety disorders: Secondary | ICD-10-CM | POA: Diagnosis not present

## 2020-07-30 DIAGNOSIS — Z7982 Long term (current) use of aspirin: Secondary | ICD-10-CM | POA: Insufficient documentation

## 2020-07-30 DIAGNOSIS — K635 Polyp of colon: Secondary | ICD-10-CM | POA: Diagnosis not present

## 2020-07-30 DIAGNOSIS — K640 First degree hemorrhoids: Secondary | ICD-10-CM | POA: Diagnosis not present

## 2020-07-30 DIAGNOSIS — E039 Hypothyroidism, unspecified: Secondary | ICD-10-CM | POA: Insufficient documentation

## 2020-07-30 DIAGNOSIS — I10 Essential (primary) hypertension: Secondary | ICD-10-CM | POA: Insufficient documentation

## 2020-07-30 DIAGNOSIS — K648 Other hemorrhoids: Secondary | ICD-10-CM | POA: Diagnosis not present

## 2020-07-30 DIAGNOSIS — Z79899 Other long term (current) drug therapy: Secondary | ICD-10-CM | POA: Diagnosis not present

## 2020-07-30 DIAGNOSIS — J449 Chronic obstructive pulmonary disease, unspecified: Secondary | ICD-10-CM | POA: Insufficient documentation

## 2020-07-30 DIAGNOSIS — Z86010 Personal history of colonic polyps: Secondary | ICD-10-CM | POA: Diagnosis not present

## 2020-07-30 DIAGNOSIS — E785 Hyperlipidemia, unspecified: Secondary | ICD-10-CM | POA: Diagnosis not present

## 2020-07-30 DIAGNOSIS — F419 Anxiety disorder, unspecified: Secondary | ICD-10-CM | POA: Insufficient documentation

## 2020-07-30 DIAGNOSIS — Z7951 Long term (current) use of inhaled steroids: Secondary | ICD-10-CM | POA: Insufficient documentation

## 2020-07-30 DIAGNOSIS — K219 Gastro-esophageal reflux disease without esophagitis: Secondary | ICD-10-CM | POA: Insufficient documentation

## 2020-07-30 DIAGNOSIS — D120 Benign neoplasm of cecum: Secondary | ICD-10-CM | POA: Insufficient documentation

## 2020-07-30 HISTORY — PX: COLONOSCOPY WITH PROPOFOL: SHX5780

## 2020-07-30 SURGERY — COLONOSCOPY WITH PROPOFOL
Anesthesia: General

## 2020-07-30 MED ORDER — GLYCOPYRROLATE 0.2 MG/ML IJ SOLN
INTRAMUSCULAR | Status: DC | PRN
  Administered 2020-07-30: .1 mg via INTRAVENOUS

## 2020-07-30 MED ORDER — SODIUM CHLORIDE 0.9 % IV SOLN: INTRAVENOUS | Status: DC

## 2020-07-30 MED ORDER — PROPOFOL 500 MG/50ML IV EMUL
INTRAVENOUS | Status: DC | PRN
  Administered 2020-07-30: 200 ug/kg/min via INTRAVENOUS

## 2020-07-30 MED ORDER — LIDOCAINE HCL (CARDIAC) PF 100 MG/5ML IV SOSY
PREFILLED_SYRINGE | INTRAVENOUS | Status: DC | PRN
  Administered 2020-07-30: 50 mg via INTRAVENOUS

## 2020-07-30 MED ORDER — PROPOFOL 10 MG/ML IV BOLUS
INTRAVENOUS | Status: DC | PRN
  Administered 2020-07-30: 70 mg via INTRAVENOUS
  Administered 2020-07-30: 30 mg via INTRAVENOUS

## 2020-07-30 MED ORDER — LIDOCAINE HCL URETHRAL/MUCOSAL 2 % EX GEL
CUTANEOUS | Status: AC
  Filled 2020-07-30: qty 5

## 2020-07-30 MED ORDER — SODIUM CHLORIDE 0.9 % IV SOLN: INTRAVENOUS | Status: DC | PRN

## 2020-07-30 NOTE — Transfer of Care (Signed)
Immediate Anesthesia Transfer of Care Note  Patient: Janet Osborne  Procedure(s) Performed: COLONOSCOPY WITH PROPOFOL (N/A )  Patient Location: PACU and Endoscopy Unit  Anesthesia Type:General  Level of Consciousness: awake, alert  and oriented  Airway & Oxygen Therapy: Patient Spontanous Breathing  Post-op Assessment: Report given to RN and Post -op Vital signs reviewed and stable  Post vital signs: Reviewed and stable  Last Vitals:  Vitals Value Taken Time  BP    Temp    Pulse    Resp    SpO2      Last Pain:  Vitals:   07/30/20 1309  TempSrc: Temporal  PainSc: 0-No pain         Complications: No complications documented.

## 2020-07-30 NOTE — Interval H&P Note (Signed)
History and Physical Interval Note:  07/30/2020 1:28 PM  Janet Osborne  has presented today for surgery, with the diagnosis of PERSONAL HX.OF COLON POLYPS.  The various methods of treatment have been discussed with the patient and family. After consideration of risks, benefits and other options for treatment, the patient has consented to  Procedure(s): COLONOSCOPY WITH PROPOFOL (N/A) as a surgical intervention.  The patient's history has been reviewed, patient examined, no change in status, stable for surgery.  I have reviewed the patient's chart and labs.  Questions were answered to the patient's satisfaction.     [place], [place]

## 2020-07-30 NOTE — H&P (Signed)
Outpatient short stay form Pre-procedure 07/30/2020 1:28 PM Briana Farner K. Alice Reichert, M.D.  Primary Physician: Fulton Reek, M.D.  Reason for visit:  Personal history of adenomatous colon polyps  History of present illness:                            Patient presents for colonoscopy for a personal hx of colon polyps. The patient denies abdominal pain, abnormal weight loss or rectal bleeding.      Current Facility-Administered Medications:  .  0.9 %  sodium chloride infusion, , Intravenous, Continuous, [place], Benay Pike, MD, Last Rate: 20 mL/hr at 07/30/20 1321, New Bag at 07/30/20 1321  Medications Prior to Admission  Medication Sig Dispense Refill Last Dose  . albuterol (VENTOLIN HFA) 108 (90 Base) MCG/ACT inhaler Inhale 1-2 puffs into the lungs every 6 (six) hours as needed for wheezing or shortness of breath.   07/30/2020 at Unknown time  . aspirin 81 MG chewable tablet Chew 81 mg by mouth daily.   07/30/2020 at Unknown time  . budesonide-formoterol (SYMBICORT) 160-4.5 MCG/ACT inhaler Inhale 2 puffs into the lungs 2 (two) times daily.   07/30/2020 at Unknown time  . Cyanocobalamin (VITAMIN B-12) 5000 MCG TBDP Take 5,000 mcg by mouth at bedtime.   07/29/2020 at Unknown time  . diazepam (VALIUM) 5 MG tablet Take 5 mg by mouth every 6 (six) hours as needed for anxiety.   Past Week at Unknown time  . esomeprazole (NEXIUM) 40 MG capsule Take 40 mg by mouth daily at 12 noon.   07/30/2020 at Unknown time  . estradiol (ESTRACE) 0.5 MG tablet Take 0.5 mg by mouth at bedtime.   Past Week at Unknown time  . gabapentin (NEURONTIN) 300 MG capsule Take 300 mg by mouth daily. Take 300mg  in the AM, and 600mg  at bedtime.   07/29/2020 at Unknown time  . hydrochlorothiazide (HYDRODIURIL) 25 MG tablet Take 25 mg by mouth daily.    07/30/2020 at Unknown time  . levothyroxine (SYNTHROID) 100 MCG tablet Take 100 mcg by mouth daily before breakfast.   07/30/2020 at Unknown time  . loratadine (CLARITIN REDITABS) 10 MG  dissolvable tablet Take 10 mg by mouth daily as needed for allergies.   07/29/2020 at Unknown time  . losartan (COZAAR) 50 MG tablet Take 50 mg by mouth daily.   07/30/2020 at Unknown time  . Multiple Vitamins-Minerals (EYE VITAMINS PO) Take 1 tablet by mouth daily.    07/29/2020 at Unknown time  . rosuvastatin (CRESTOR) 20 MG tablet TAKE ONE TABLET BY MOUTH EVERY DAY 90 tablet 0 07/30/2020 at Unknown time  . acetaminophen (TYLENOL) 500 MG tablet Take 500 mg by mouth 2 (two) times a day.     . Cholecalciferol (VITAMIN D) 50 MCG (2000 UT) CAPS Take 2,000 Units by mouth at bedtime. (Patient not taking: Reported on 06/22/2020)     . Docusate Sodium (DSS) 100 MG CAPS Take by mouth.     Marland Kitchen ibuprofen (ADVIL) 800 MG tablet Take 1 tablet (800 mg total) by mouth every 6 (six) hours as needed. 60 tablet 1   . Propylene Glycol (SYSTANE COMPLETE OP) Place 1 drop into both eyes 2 (two) times daily as needed (dry eyes).     . sodium chloride (MURO 128) 5 % ophthalmic solution Place 1 drop into the left eye 2 (two) times a day.     . traMADol (ULTRAM) 50 MG tablet Take 50 mg by mouth every  6 (six) hours as needed for moderate pain.         Allergies  Allergen Reactions  . Macrodantin [Nitrofurantoin Macrocrystal] Hives  . Penicillins Other (See Comments)    Did it involve swelling of the face/tongue/throat, SOB, or low BP? Unknown Did it involve sudden or severe rash/hives, skin peeling, or any reaction on the inside of your mouth or nose? Unknown Did you need to seek medical attention at a hospital or doctor's office? Unknown When did it last happen? as a child If all above answers are "NO", may proceed with cephalosporin use.      Past Medical History:  Diagnosis Date  . Abdominal hernia   . Anemia   . Anxiety   . Chronic constipation   . Colon polyps   . Complication of anesthesia    coughing episodes after colonoscopy and sugical procedure 2011 (hx copd)  . COPD (chronic obstructive pulmonary  disease) ([place])   . DDD (degenerative disc disease), cervical   . Depression   . Endometriosis   . Essential hypertension, benign   . Fibrocystic breast disease   . GERD (gastroesophageal reflux disease)   . H/O hand surgery 03/17/2017  . Hemorrhoids   . Hyperlipidemia   . Hypothyroidism   . Migraine   . Nontoxic multinodular goiter   . Osteoarthrosis involving, or with mention of more than one site, but not specified as generalized, multiple sites   . Tobacco use     Review of systems:  Otherwise negative.    Physical Exam  Gen: Alert, oriented. Appears stated age.  HEENT: [place]/AT. PERRLA. Lungs: CTA, no wheezes. CV: RR nl S1, S2. Abd: soft, benign, no masses. BS+ Ext: No edema. Pulses 2+    Planned procedures: Proceed with colonoscopy. The patient understands the nature of the planned procedure, indications, risks, alternatives and potential complications including but not limited to bleeding, infection, perforation, damage to internal organs and possible oversedation/side effects from anesthesia. The patient agrees and gives consent to proceed.  Please refer to procedure notes for findings, recommendations and patient disposition/instructions.     Kaylah Chiasson K. Alice Reichert, M.D. Gastroenterology 07/30/2020  1:28 PM

## 2020-07-30 NOTE — Op Note (Signed)
[facility] Gastroenterology Patient Name: Janet Osborne Procedure Date: 07/30/2020 1:56 PM MRN: [id number] Account #: [id number] Date of Birth: 03-30-1951 Admit Type: Outpatient Age: 69 Room: [facility] ENDO ROOM 3 Gender: Female Note Status: Finalized Procedure:             Colonoscopy Indications:           Surveillance: Personal history of adenomatous polyps                         on last colonoscopy > 5 years ago Providers:             Lorie Apley K. Alice Reichert MD, MD Referring MD:          Leonie Douglas. Doy Hutching, MD (Referring MD) Medicines:             Propofol per Anesthesia Complications:         No immediate complications. Procedure:             Pre-Anesthesia Assessment:                        - The risks and benefits of the procedure and the                         sedation options and risks were discussed with the                         patient. All questions were answered and informed                         consent was obtained.                        - Patient identification and proposed procedure were                         verified prior to the procedure by the nurse. The                         procedure was verified in the procedure room.                        - ASA Grade Assessment: III - A patient with severe                         systemic disease.                        - After reviewing the risks and benefits, the patient                         was deemed in satisfactory condition to undergo the                         procedure.                        After obtaining informed consent, the colonoscope was                         passed under direct  vision. Throughout the procedure,                         the patient's blood pressure, pulse, and oxygen                         saturations were monitored continuously. The                         Colonoscope was introduced through the anus and                         advanced to the the cecum, identified  by appendiceal                         orifice and ileocecal valve. The colonoscopy was                         performed without difficulty. The patient tolerated                         the procedure well. The quality of the bowel                         preparation was good. The ileocecal valve, appendiceal                         orifice, and rectum were photographed. Findings:      The perianal and digital rectal examinations were normal. Pertinent       negatives include normal sphincter tone and no palpable rectal lesions.      Many small and large-mouthed diverticula were found in the sigmoid colon.      Two sessile polyps were found in the cecum. The polyps were 2 to 3 mm in       size. These polyps were removed with a jumbo cold forceps. Resection and       retrieval were complete.      Non-bleeding internal hemorrhoids were found during retroflexion. The       hemorrhoids were Grade I (internal hemorrhoids that do not prolapse).      The exam was otherwise without abnormality. Impression:            - Diverticulosis in the sigmoid colon.                        - Two 2 to 3 mm polyps in the cecum, removed with a                         jumbo cold forceps. Resected and retrieved.                        - Non-bleeding internal hemorrhoids.                        - The examination was otherwise normal. Recommendation:        - Patient has a contact number available for                         emergencies. The signs and  symptoms of potential                         delayed complications were discussed with the patient.                         Return to normal activities tomorrow. Written                         discharge instructions were provided to the patient.                        - Resume previous diet.                        - Continue present medications.                        - Repeat colonoscopy is recommended for surveillance.                         The colonoscopy  date will be determined after                         pathology results from today's exam become available                         for review.                        - Return to GI office PRN.                        - The findings and recommendations were discussed with                         the patient. Procedure Code(s):     --- Professional ---                        [id number], Colonoscopy, flexible; with biopsy, single or                         multiple Diagnosis Code(s):     --- Professional ---                        K57.30, Diverticulosis of large intestine without                         perforation or abscess without bleeding                        K63.5, Polyp of colon                        K64.0, First degree hemorrhoids                        Z86.010, Personal history of colonic polyps CPT copyright 2019 American Medical Association. All rights reserved. The codes documented in this report are preliminary and upon coder review may  be revised to meet current compliance requirements. Benay Pike  [facility] MD, MD 07/30/2020 2:26:45 PM This report has been signed electronically. Number of Addenda: 0 Note Initiated On: 07/30/2020 1:56 PM Scope Withdrawal Time: 0 hours 6 minutes 16 seconds  Total Procedure Duration: 0 hours 12 minutes 34 seconds  Estimated Blood Loss:  Estimated blood loss: none.      [facility]

## 2020-07-30 NOTE — Anesthesia Preprocedure Evaluation (Signed)
Anesthesia Evaluation  Patient identified by MRN, date of birth, ID band Patient awake    Reviewed: Allergy & Precautions, H&P , NPO status , Patient's Chart, lab work & pertinent test results, reviewed documented beta blocker date and time   History of Anesthesia Complications (+) history of anesthetic complications  Airway Mallampati: II   Neck ROM: full    Dental  (+) Poor Dentition   Pulmonary shortness of breath and with exertion, COPD,  COPD inhaler, former smoker,    Pulmonary exam normal        Cardiovascular Exercise Tolerance: Poor hypertension, On Medications + CAD  Normal cardiovascular exam Rhythm:regular Rate:Normal     Neuro/Psych  Headaches, PSYCHIATRIC DISORDERS Anxiety Depression    GI/Hepatic Neg liver ROS, GERD  Medicated,  Endo/Other  Hypothyroidism   Renal/GU negative Renal ROS  negative genitourinary   Musculoskeletal   Abdominal   Peds  Hematology  (+) Blood dyscrasia, anemia ,   Anesthesia Other Findings Past Medical History: No date: Abdominal hernia No date: Anemia No date: Anxiety No date: Chronic constipation No date: Colon polyps No date: Complication of anesthesia     Comment:  coughing episodes after colonoscopy and sugical               procedure 2011 (hx copd) No date: COPD (chronic obstructive pulmonary disease) ([facility]) No date: DDD (degenerative disc disease), cervical No date: Depression No date: Endometriosis No date: Essential hypertension, benign No date: Fibrocystic breast disease No date: GERD (gastroesophageal reflux disease) 03/17/2017: H/O hand surgery No date: Hemorrhoids No date: Hyperlipidemia No date: Hypothyroidism No date: Migraine No date: Nontoxic multinodular goiter No date: Osteoarthrosis involving, or with mention of more than one  site, but not specified as generalized, multiple sites No date: Tobacco use Past Surgical History: No date:  ABDOMINAL HYSTERECTOMY No date: ANTERIOR CERVICAL DECOMP/DISCECTOMY FUSION No date: APPENDECTOMY No date: CARPAL TUNNEL RELEASE; Bilateral 05/27/2019: CHEILECTOMY; Right     Comment:  Procedure: CHEILECTOMY;  Surgeon: Sharlotte Alamo, DPM;                Location: [facility] ORS;  Service: Podiatry;  Laterality:               Right; No date: COLONOSCOPY No date: ESOPHAGOGASTRODUODENOSCOPY 04/03/2017: ESOPHAGOGASTRODUODENOSCOPY (EGD) WITH PROPOFOL; N/A     Comment:  Procedure: ESOPHAGOGASTRODUODENOSCOPY (EGD) WITH               PROPOFOL;  Surgeon: Manya Silvas, MD;  Location: [place];  Service: Endoscopy;  Laterality: N/A; No date: FOOT SURGERY     Comment:  with hardware No date: HERNIA REPAIR 01/21/2012: JOINT REPLACEMENT; Left     Comment:  thumb No date: POLYPECTOMY No date: ROTATOR CUFF REPAIR; Left No date: thumb surgery No date: TONSILLECTOMY No date: TRIGGER FINGER RELEASE; Left   Reproductive/Obstetrics negative OB ROS                             Anesthesia Physical Anesthesia Plan  ASA: III  Anesthesia Plan: General   Post-op Pain Management:    Induction:   PONV Risk Score and Plan:   Airway Management Planned:   Additional Equipment:   Intra-op Plan:   Post-operative Plan:   Informed Consent: I have reviewed the patients History and Physical, chart, labs and discussed the procedure including the risks, benefits and alternatives  for the proposed anesthesia with the patient or authorized representative who has indicated his/her understanding and acceptance.     Dental Advisory Given  Plan Discussed with: CRNA  Anesthesia Plan Comments:         Anesthesia Quick Evaluation

## 2020-08-01 ENCOUNTER — Encounter: Payer: Self-pay | Admitting: Internal Medicine

## 2020-08-01 LAB — SURGICAL PATHOLOGY

## 2020-08-02 NOTE — Anesthesia Postprocedure Evaluation (Signed)
Anesthesia Post Note  Patient: Janet Osborne  Procedure(s) Performed: COLONOSCOPY WITH PROPOFOL (N/A )  Patient location during evaluation: PACU Anesthesia Type: General Level of consciousness: awake and alert Pain management: pain level controlled Vital Signs Assessment: post-procedure vital signs reviewed and stable Respiratory status: spontaneous breathing, nonlabored ventilation, respiratory function stable and patient connected to nasal cannula oxygen Cardiovascular status: blood pressure returned to baseline and stable Postop Assessment: no apparent nausea or vomiting Anesthetic complications: no   No complications documented.   Last Vitals:  Vitals:   07/30/20 1309 07/30/20 1425  BP: 130/81 109/64  Pulse: 66   Resp: 15   Temp: (!) 35.8 C (!) 35.7 C  SpO2: 100%     Last Pain:  Vitals:   07/30/20 1445  TempSrc:   PainSc: 0-No pain                 Molli Barrows

## 2020-08-04 DIAGNOSIS — G4733 Obstructive sleep apnea (adult) (pediatric): Secondary | ICD-10-CM | POA: Diagnosis not present

## 2020-08-07 DIAGNOSIS — D2272 Melanocytic nevi of left lower limb, including hip: Secondary | ICD-10-CM | POA: Diagnosis not present

## 2020-08-07 DIAGNOSIS — D2271 Melanocytic nevi of right lower limb, including hip: Secondary | ICD-10-CM | POA: Diagnosis not present

## 2020-08-07 DIAGNOSIS — L821 Other seborrheic keratosis: Secondary | ICD-10-CM | POA: Diagnosis not present

## 2020-08-07 DIAGNOSIS — D2261 Melanocytic nevi of right upper limb, including shoulder: Secondary | ICD-10-CM | POA: Diagnosis not present

## 2020-08-07 DIAGNOSIS — D225 Melanocytic nevi of trunk: Secondary | ICD-10-CM | POA: Diagnosis not present

## 2020-08-07 DIAGNOSIS — L570 Actinic keratosis: Secondary | ICD-10-CM | POA: Diagnosis not present

## 2020-08-07 DIAGNOSIS — D2262 Melanocytic nevi of left upper limb, including shoulder: Secondary | ICD-10-CM | POA: Diagnosis not present

## 2020-08-07 DIAGNOSIS — X32XXXA Exposure to sunlight, initial encounter: Secondary | ICD-10-CM | POA: Diagnosis not present

## 2020-08-09 IMAGING — US RIGHT LOWER EXTREMITY VENOUS ULTRASOUND
1 series · 13 of 24 positions shown · non-contrast
Comparison: None.

CLINICAL DATA: Right lower extremity pain and swelling



[Series 1: right lower extremity venous ultrasound · 0.06mm/px · 13 of 37 slices shown]
[im 1/37]
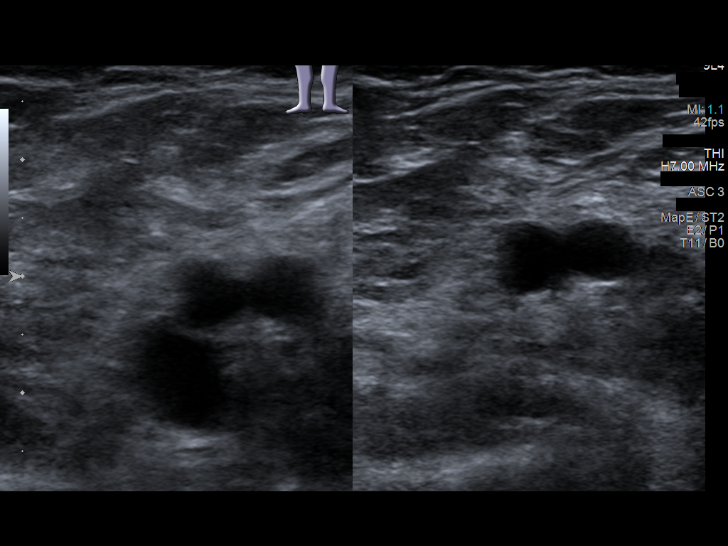
[im 4/37]
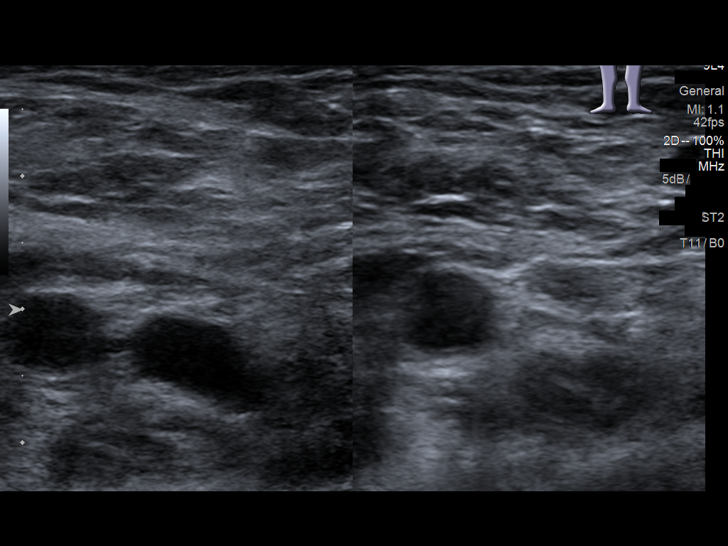
[im 7/37]
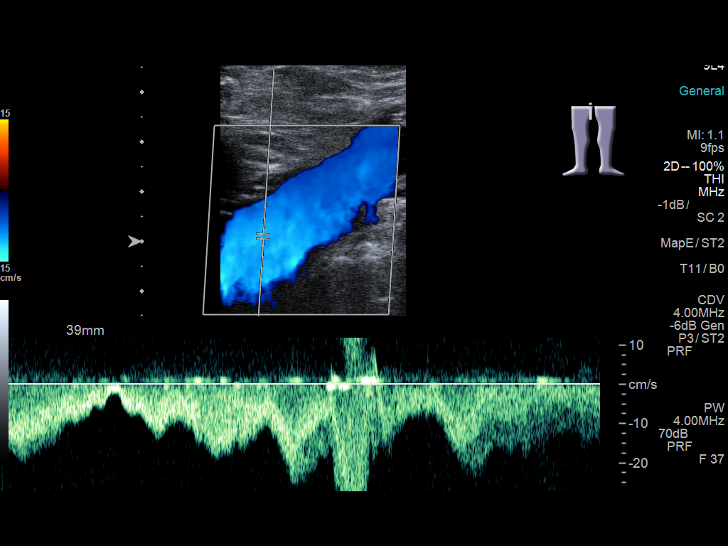
[im 10/37]
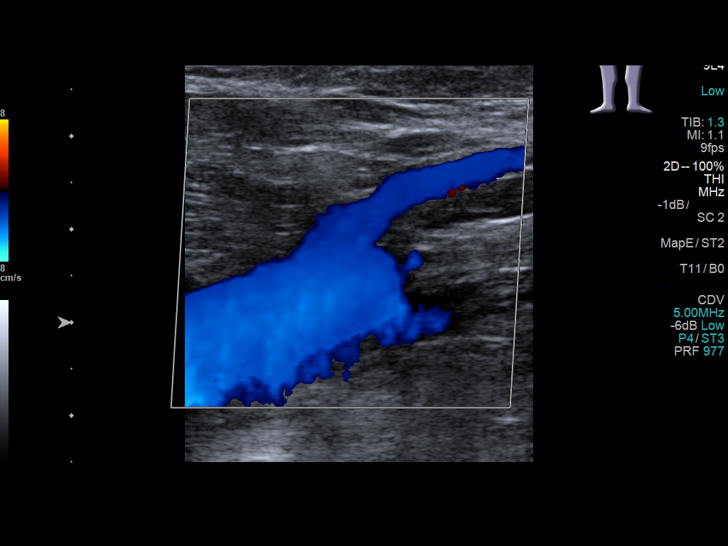
[im 13/37]
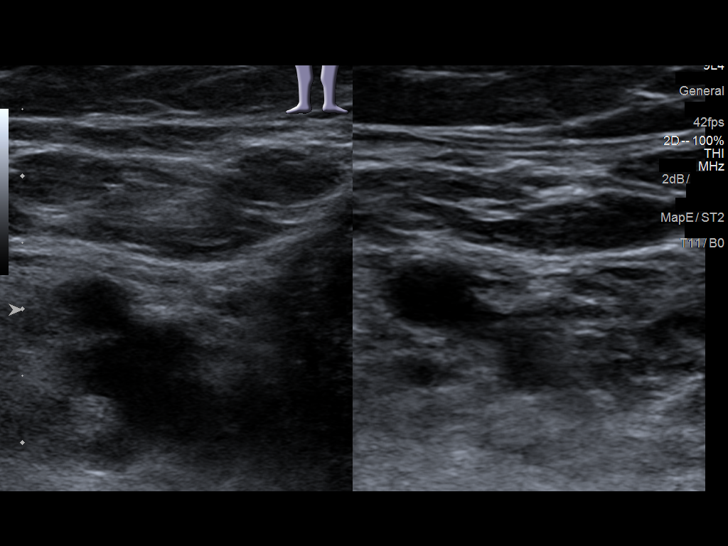
[im 16/37]
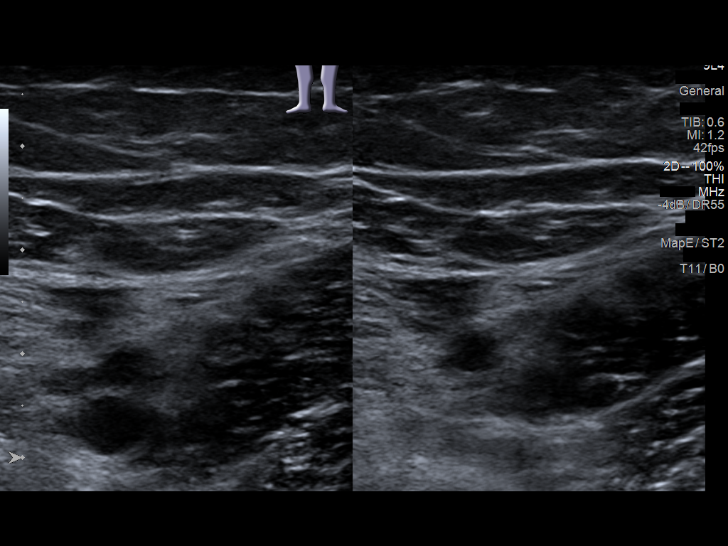
[im 19/37]
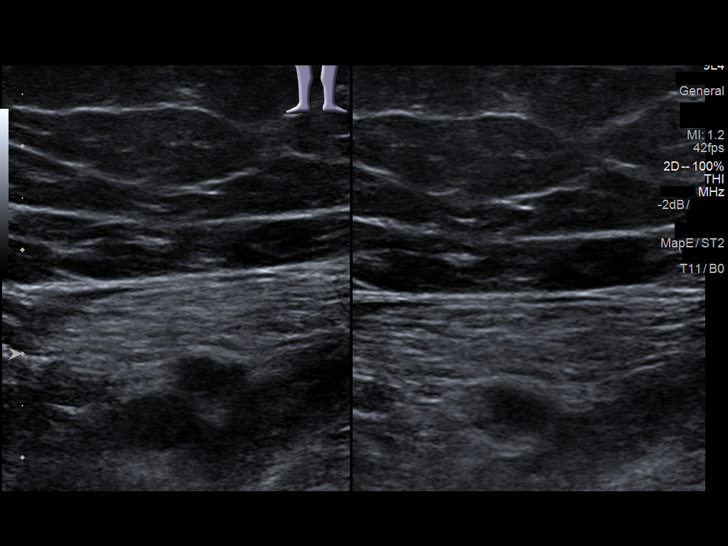
[im 21/37]
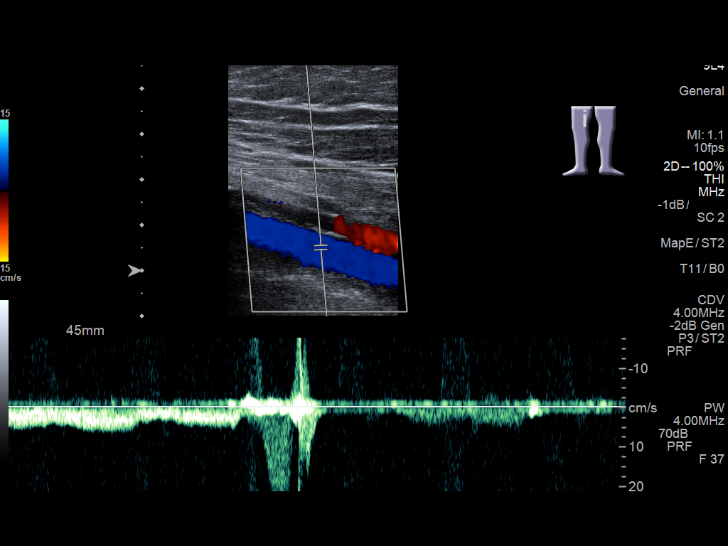
[im 24/37]
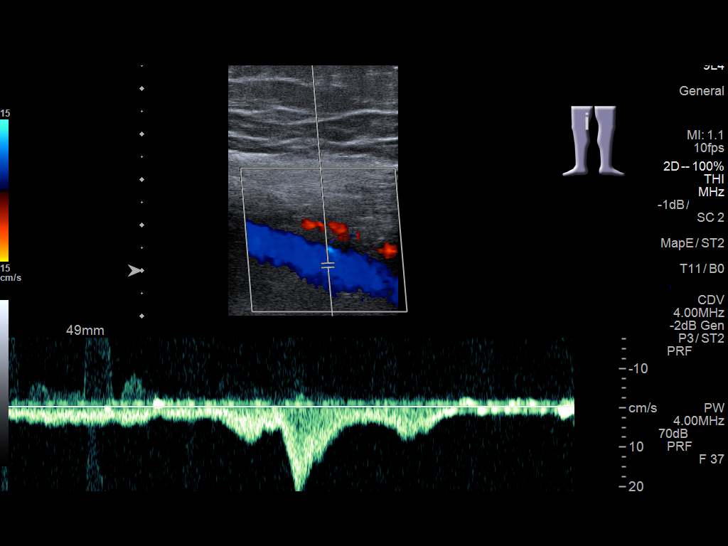
[im 27/37]
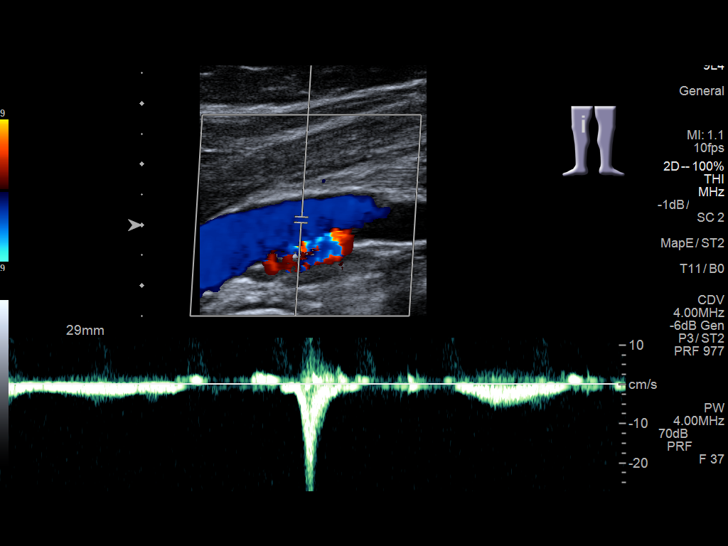
[im 30/37]
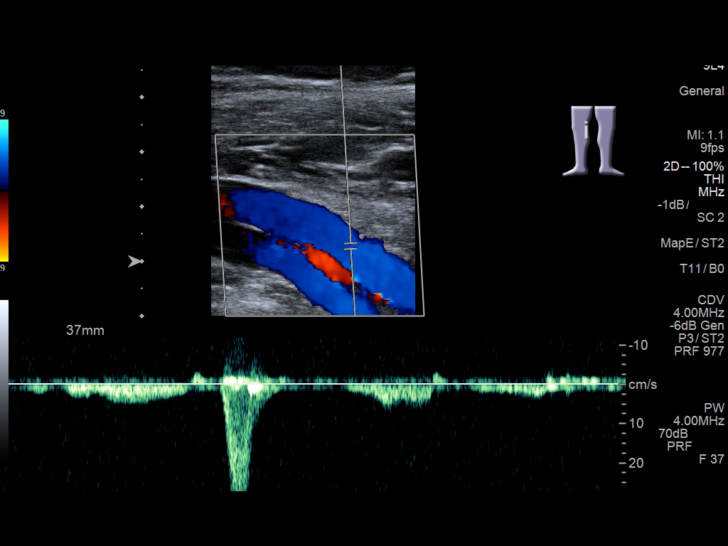
[im 33/37]
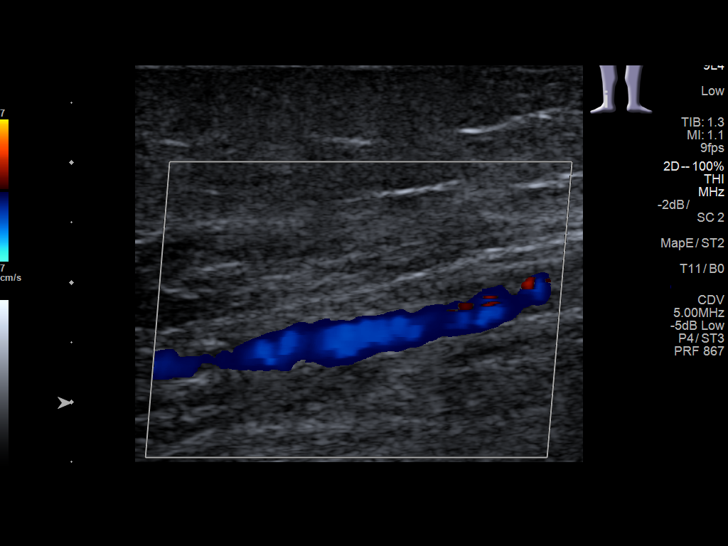
[im 37/37]
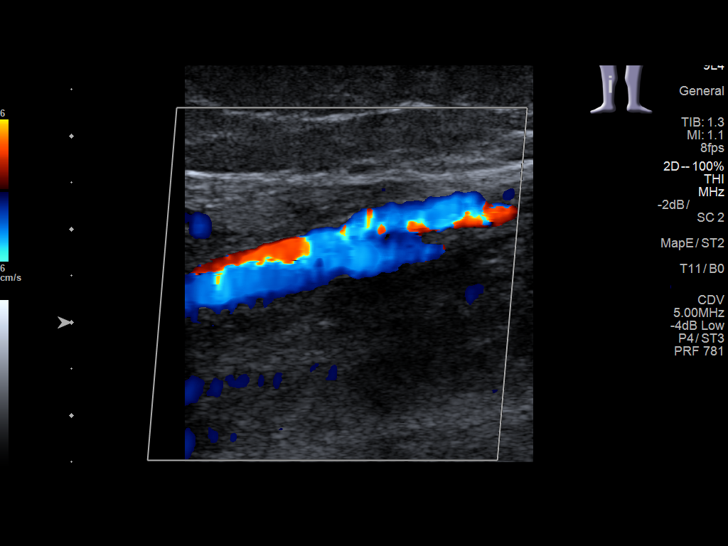

[13 of 24 positions shown; findings below may reference images not displayed]

FINDINGS: Contralateral Common Femoral Vein: Respiratory phasicity is normal
and symmetric with the symptomatic side. No evidence of thrombus.
Normal compressibility.

Common Femoral Vein: No evidence of thrombus. Normal
compressibility, respiratory phasicity and response to augmentation.

Saphenofemoral Junction: No evidence of thrombus. Normal
compressibility and flow on color Doppler imaging.

Profunda Femoral Vein: No evidence of thrombus. Normal
compressibility and flow on color Doppler imaging.

Femoral Vein: No evidence of thrombus. Normal compressibility,
respiratory phasicity and response to augmentation.

Popliteal Vein: No evidence of thrombus. Normal compressibility,
respiratory phasicity and response to augmentation.

Calf Veins: No evidence of thrombus. Normal compressibility and flow
on color Doppler imaging.

Superficial Great Saphenous Vein: No evidence of thrombus. Normal
compressibility.

Venous Reflux:  Not assessed

Other Findings:  None.
IMPRESSION: No evidence of deep venous thrombosis.

## 2020-08-18 ENCOUNTER — Other Ambulatory Visit: Payer: Self-pay | Admitting: Internal Medicine

## 2020-09-04 DIAGNOSIS — G4733 Obstructive sleep apnea (adult) (pediatric): Secondary | ICD-10-CM | POA: Diagnosis not present

## 2020-09-18 ENCOUNTER — Encounter: Payer: Self-pay | Admitting: Podiatry

## 2020-09-18 ENCOUNTER — Ambulatory Visit (INDEPENDENT_AMBULATORY_CARE_PROVIDER_SITE_OTHER): Payer: PPO

## 2020-09-18 ENCOUNTER — Ambulatory Visit: Payer: PPO | Admitting: Podiatry

## 2020-09-18 ENCOUNTER — Other Ambulatory Visit: Payer: Self-pay

## 2020-09-18 DIAGNOSIS — Z9889 Other specified postprocedural states: Secondary | ICD-10-CM | POA: Diagnosis not present

## 2020-09-18 DIAGNOSIS — R600 Localized edema: Secondary | ICD-10-CM

## 2020-09-18 DIAGNOSIS — M19071 Primary osteoarthritis, right ankle and foot: Secondary | ICD-10-CM

## 2020-09-18 NOTE — Progress Notes (Signed)
Subjective:  Patient ID: Janet Osborne, female    DOB: 19-Jul-1951,  MRN: [id number]  Chief Complaint  Patient presents with  . Routine Post Op    DOS 05/21/2020 RT FIRST MPJ FUSION  "its still hurts and swells.  I still can't wear a shoe yet"    69 y.o. female presents with the above complaint.  Patient presents with complaint of pain to the first MPJ joint after undergoing a MPJ fusion.  Patient states that she is not able to wear her regular shoes however she has been wearing crocs in flip-flops which has helped a little bit.  She also has complaint of edema.  She has been on her foot pretty good amount of time.  She has not been very compliant with wearing orthotics.  She just wants to make sure that everything is okay.  She denies any other acute complaints   Review of Systems: Negative except as noted in the HPI. Denies N/V/F/Ch.  Past Medical History:  Diagnosis Date  . Abdominal hernia   . Anemia   . Anxiety   . Chronic constipation   . Colon polyps   . Complication of anesthesia    coughing episodes after colonoscopy and sugical procedure 2011 (hx copd)  . COPD (chronic obstructive pulmonary disease) ([address])   . DDD (degenerative disc disease), cervical   . Depression   . Endometriosis   . Essential hypertension, benign   . Fibrocystic breast disease   . GERD (gastroesophageal reflux disease)   . H/O hand surgery 03/17/2017  . Hemorrhoids   . Hyperlipidemia   . Hypothyroidism   . Migraine   . Nontoxic multinodular goiter   . Osteoarthrosis involving, or with mention of more than one site, but not specified as generalized, multiple sites   . Tobacco use     Current Outpatient Medications:  .  acetaminophen (TYLENOL) 500 MG tablet, Take 500 mg by mouth 2 (two) times a day., Disp: , Rfl:  .  albuterol (VENTOLIN HFA) 108 (90 Base) MCG/ACT inhaler, Inhale 1-2 puffs into the lungs every 6 (six) hours as needed for wheezing or shortness of breath., Disp: , Rfl:  .   aspirin 81 MG chewable tablet, Chew 81 mg by mouth daily., Disp: , Rfl:  .  budesonide-formoterol (SYMBICORT) 160-4.5 MCG/ACT inhaler, Inhale 2 puffs into the lungs 2 (two) times daily., Disp: , Rfl:  .  Cholecalciferol (VITAMIN D) 50 MCG (2000 UT) CAPS, Take 2,000 Units by mouth at bedtime. (Patient not taking: Reported on 06/22/2020), Disp: , Rfl:  .  Cyanocobalamin (VITAMIN B-12) 5000 MCG TBDP, Take 5,000 mcg by mouth at bedtime., Disp: , Rfl:  .  diazepam (VALIUM) 5 MG tablet, Take 5 mg by mouth every 6 (six) hours as needed for anxiety., Disp: , Rfl:  .  Docusate Sodium (DSS) 100 MG CAPS, Take by mouth., Disp: , Rfl:  .  esomeprazole (NEXIUM) 40 MG capsule, Take 40 mg by mouth daily at 12 noon., Disp: , Rfl:  .  estradiol (ESTRACE) 0.5 MG tablet, Take 0.5 mg by mouth at bedtime., Disp: , Rfl:  .  gabapentin (NEURONTIN) 300 MG capsule, Take 300 mg by mouth daily. Take 300mg  in the AM, and 600mg  at bedtime., Disp: , Rfl:  .  hydrochlorothiazide (HYDRODIURIL) 25 MG tablet, Take 25 mg by mouth daily. , Disp: , Rfl:  .  ibuprofen (ADVIL) 800 MG tablet, Take 1 tablet (800 mg total) by mouth every 6 (six) hours as needed., Disp:  60 tablet, Rfl: 1 .  levothyroxine (SYNTHROID) 100 MCG tablet, Take 100 mcg by mouth daily before breakfast., Disp: , Rfl:  .  loratadine (CLARITIN REDITABS) 10 MG dissolvable tablet, Take 10 mg by mouth daily as needed for allergies., Disp: , Rfl:  .  losartan (COZAAR) 50 MG tablet, Take 50 mg by mouth daily., Disp: , Rfl:  .  Multiple Vitamins-Minerals (EYE VITAMINS PO), Take 1 tablet by mouth daily. , Disp: , Rfl:  .  Propylene Glycol (SYSTANE COMPLETE OP), Place 1 drop into both eyes 2 (two) times daily as needed (dry eyes)., Disp: , Rfl:  .  rosuvastatin (CRESTOR) 20 MG tablet, TAKE ONE TABLET BY MOUTH EVERY DAY, Disp: 90 tablet, Rfl: 0 .  sodium chloride (MURO 128) 5 % ophthalmic solution, Place 1 drop into the left eye 2 (two) times a day., Disp: , Rfl:  .  traMADol  (ULTRAM) 50 MG tablet, Take 50 mg by mouth every 6 (six) hours as needed for moderate pain. , Disp: , Rfl:   Social History   Tobacco Use  Smoking Status Former Smoker  . Packs/day: 0.75  . Years: 45.00  . Pack years: 33.75  . Types: Cigarettes  . Quit date: 07/2018  . Years since quitting: 2.2  Smokeless Tobacco Never Used  Tobacco Comment   Uses nicotine patch    Allergies  Allergen Reactions  . Macrodantin [Nitrofurantoin Macrocrystal] Hives  . Penicillins Other (See Comments)    Did it involve swelling of the face/tongue/throat, SOB, or low BP? Unknown Did it involve sudden or severe rash/hives, skin peeling, or any reaction on the inside of your mouth or nose? Unknown Did you need to seek medical attention at a hospital or doctor's office? Unknown When did it last happen? as a child If all above answers are "NO", may proceed with cephalosporin use.    Objective:  There were no vitals filed for this visit. There is no height or weight on file to calculate BMI. Constitutional Well developed. Well nourished.  Vascular Dorsalis pedis pulses palpable bilaterally. Posterior tibial pulses palpable bilaterally. Capillary refill normal to all digits.  No cyanosis or clubbing noted. Pedal hair growth normal.  Neurologic Normal speech. Oriented to person, place, and time. Epicritic sensation to light touch grossly present bilaterally.  Dermatologic Nails well groomed and normal in appearance. No open wounds. No skin lesions.  Orthopedic:  Mild pain on palpation to the first metatarsophalangeal joint.  Stiff first MPJ joint noted without any motion.  Hardware appears to be intact.  Mild pain along the course of the hardware as well.   Radiographs: 3 views of skeletally mature adult right foot: Good consolidation noted at the first metatarsophalangeal joint.  Compared to prior x-ray there appears to be more bony bridging/consolidation noted.  Assessment:   1. Arthritis of  first metatarsophalangeal (MTP) joint of right foot   2. Localized edema    Plan:  Patient was evaluated and treated and all questions answered.  Right first MPJ pain/edema status post first MPJ fusion -Overall clinically patient is doing well, I discussed with the patient that swelling/edema is one of the Lasix to go away there is still acute inflammation that is going on within the joint that seems to be causing some of the fluid.  Patient states understanding.  Patient could also be having painful orthopedic hardware however given that x-ray showing improvement in terms of bony bridging across the joint at this time I will hold off on  taking the hardware out.  I will see her back again in 3 months and if the pain has resolved we will leave the hardware and however go back in and take the painful orthopedic hardware out. -Repeat x-ray in 3 months -I discussed with her to make shoe gear modification including obtaining new balance sneakers and wear her custom orthotics with Morton's extension.  No follow-ups on file.

## 2020-10-04 DIAGNOSIS — G4733 Obstructive sleep apnea (adult) (pediatric): Secondary | ICD-10-CM | POA: Diagnosis not present

## 2020-10-24 DIAGNOSIS — I10 Essential (primary) hypertension: Secondary | ICD-10-CM | POA: Diagnosis not present

## 2020-10-24 DIAGNOSIS — E782 Mixed hyperlipidemia: Secondary | ICD-10-CM | POA: Diagnosis not present

## 2020-10-24 DIAGNOSIS — R739 Hyperglycemia, unspecified: Secondary | ICD-10-CM | POA: Diagnosis not present

## 2020-10-24 DIAGNOSIS — Z79899 Other long term (current) drug therapy: Secondary | ICD-10-CM | POA: Diagnosis not present

## 2020-10-24 DIAGNOSIS — E039 Hypothyroidism, unspecified: Secondary | ICD-10-CM | POA: Diagnosis not present

## 2020-10-24 DIAGNOSIS — H2512 Age-related nuclear cataract, left eye: Secondary | ICD-10-CM | POA: Diagnosis not present

## 2020-10-26 DIAGNOSIS — R079 Chest pain, unspecified: Secondary | ICD-10-CM | POA: Diagnosis not present

## 2020-10-26 DIAGNOSIS — E782 Mixed hyperlipidemia: Secondary | ICD-10-CM | POA: Diagnosis not present

## 2020-10-26 DIAGNOSIS — F32A Depression, unspecified: Secondary | ICD-10-CM | POA: Diagnosis not present

## 2020-10-26 DIAGNOSIS — R0602 Shortness of breath: Secondary | ICD-10-CM | POA: Diagnosis not present

## 2020-10-26 DIAGNOSIS — J431 Panlobular emphysema: Secondary | ICD-10-CM | POA: Diagnosis not present

## 2020-10-26 DIAGNOSIS — E039 Hypothyroidism, unspecified: Secondary | ICD-10-CM | POA: Diagnosis not present

## 2020-10-26 DIAGNOSIS — R739 Hyperglycemia, unspecified: Secondary | ICD-10-CM | POA: Diagnosis not present

## 2020-10-26 DIAGNOSIS — R413 Other amnesia: Secondary | ICD-10-CM | POA: Diagnosis not present

## 2020-10-26 DIAGNOSIS — I10 Essential (primary) hypertension: Secondary | ICD-10-CM | POA: Diagnosis not present

## 2020-10-26 DIAGNOSIS — F419 Anxiety disorder, unspecified: Secondary | ICD-10-CM | POA: Diagnosis not present

## 2020-10-26 DIAGNOSIS — Z0000 Encounter for general adult medical examination without abnormal findings: Secondary | ICD-10-CM | POA: Diagnosis not present

## 2020-12-20 ENCOUNTER — Ambulatory Visit: Payer: PPO | Admitting: Podiatry

## 2020-12-20 ENCOUNTER — Other Ambulatory Visit: Payer: Self-pay | Admitting: Internal Medicine

## 2020-12-24 NOTE — H&P (View-Only) (Signed)
Follow-up Outpatient Visit Date: 12/26/2020  Primary Care Provider: Idelle Crouch, MD [place] [address]  Chief Complaint: Shortness of breath  HPI:  Janet Osborne is a 70 y.o. female with history of nonobstructive coronary artery disease by cardiac CTA, hypertension, hyperlipidemia, COPD, obstructive sleep apnea not compliant with CPAP, and thyroid disease, who presents for follow-up of coronary artery disease.  I last saw her in July, at which time she was still recovering from her right foot/ankle surgery.  She noted stable chronic exertional dyspnea as well as occasional atypical chest pain.  Today, Janet Osborne reports that she has been feeling more short of breath over the last few months.  She was referred for a stress test earlier this week by Dr. Doy Hutching, though this could not be performed on account of the clinic being closed due to inclement weather.  She has not had any frank chest pain but is concerned about her worsening dyspnea Eliz generalized fatigue.  She also notes continued leg/foot pain despite 2 surgeries over the last few years.  She notes that her activity has been limited on account of this.  She was recently placed on Spiriva in addition to albuterol and Symbicort; her breathing did not improve much.  She admits to not using CPAP regularly.  She was last seen by Dr. Mortimer Fries in the pulmonary clinic a year ago.  She feels quite  --------------------------------------------------------------------------------------------------  Cardiovascular History & Procedures: Cardiovascular Problems:  Dyspnea on exertion  Atypical chest pain  Risk Factors:  Hypertension, hyperlipidemia,prior tobacco use,and age greater than 24  Cath/PCI:  None  CV Surgery:  None  EP Procedures and Devices:  None  Non-Invasive Evaluation(s):  Cardiac CTA (07/13/2019): LMCA normal.  LAD with mixed proximal and mid vessel disease with  stenosis up to 51-69% (CT FFR 0.86).  LCx and RCA normal.  Coronary calcium score 191 Agatston units.  Aortic atherosclerosis noted.  Exercise stress echocardiogram (09/15/2018, [facility]): Normal baseline LVEF (greater than 55%) with hyperdynamic function with stress. No regional wall motion abnormality. Trivial aortic regurgitation. Mild mitral and tricuspid regurgitation.   Recent CV Pertinent Labs: Lab Results  Component Value Date   K 4.2 07/01/2019   BUN 10 07/01/2019   CREATININE 0.67 07/01/2019    Past medical and surgical history were reviewed and updated in EPIC.  Current Meds  Medication Sig  . acetaminophen (TYLENOL) 500 MG tablet Take 500 mg by mouth 2 (two) times a day.  . albuterol (VENTOLIN HFA) 108 (90 Base) MCG/ACT inhaler Inhale 1-2 puffs into the lungs every 6 (six) hours as needed for wheezing or shortness of breath.  Marland Kitchen aspirin 81 MG chewable tablet Chew 81 mg by mouth daily.  . budesonide-formoterol (SYMBICORT) 160-4.5 MCG/ACT inhaler Inhale 2 puffs into the lungs 2 (two) times daily.  . Cholecalciferol (VITAMIN D) 50 MCG (2000 UT) CAPS Take 2,000 Units by mouth at bedtime.  . Cyanocobalamin (VITAMIN B-12) 5000 MCG TBDP Take 5,000 mcg by mouth at bedtime.  . diazepam (VALIUM) 5 MG tablet Take 5 mg by mouth every 6 (six) hours as needed for anxiety.  Mariane Baumgarten Sodium (DSS) 100 MG CAPS Take by mouth.  . esomeprazole (NEXIUM) 40 MG capsule Take 40 mg by mouth daily at 12 noon.  Marland Kitchen estradiol (ESTRACE) 0.5 MG tablet Take 0.5 mg by mouth at bedtime.  . gabapentin (NEURONTIN) 300 MG capsule Take 300 mg by mouth daily. Take 300mg  in the AM, and  600mg  at bedtime.  . hydrochlorothiazide (HYDRODIURIL) 25 MG tablet Take 25 mg by mouth daily.   Marland Kitchen ibuprofen (ADVIL) 800 MG tablet Take 1 tablet (800 mg total) by mouth every 6 (six) hours as needed.  Marland Kitchen levothyroxine (SYNTHROID) 100 MCG tablet Take 100 mcg by mouth daily before breakfast.  . loratadine (CLARITIN REDITABS)  10 MG dissolvable tablet Take 10 mg by mouth daily as needed for allergies.  Marland Kitchen losartan (COZAAR) 50 MG tablet Take 50 mg by mouth daily.  . Multiple Vitamins-Minerals (EYE VITAMINS PO) Take 1 tablet by mouth daily.   Marland Kitchen Propylene Glycol (SYSTANE COMPLETE OP) Place 1 drop into both eyes 2 (two) times daily as needed (dry eyes).  . rosuvastatin (CRESTOR) 20 MG tablet TAKE ONE TABLET BY MOUTH EVERY DAY  . sertraline (ZOLOFT) 25 MG tablet Take 25 mg (1 tablet) daily for 2 weeks then increase to 50 mg (2 tablets) daily  . sodium chloride (MURO 128) 5 % ophthalmic solution Place 1 drop into the left eye 2 (two) times a day.  . traMADol (ULTRAM) 50 MG tablet Take 50 mg by mouth every 6 (six) hours as needed for moderate pain.     Allergies: Macrodantin [nitrofurantoin macrocrystal] and Penicillins  Social History   Tobacco Use  . Smoking status: Former Smoker    Packs/day: 0.75    Years: 45.00    Pack years: 33.75    Types: Cigarettes    Quit date: 07/2018    Years since quitting: 2.4  . Smokeless tobacco: Never Used  . Tobacco comment: Uses nicotine patch  Vaping Use  . Vaping Use: Never used  Substance Use Topics  . Alcohol use: Not Currently    Comment: occassional  . Drug use: No    Family History  Problem Relation Age of Onset  . Hypertension Mother   . Clotting disorder Mother   . Diabetes Mother   . Dementia Mother   . Osteoarthritis Mother   . Gallbladder disease Mother   . Heart disease Mother   . Hypertension Father   . Prostate cancer Father   . COPD Father   . Osteoarthritis Father   . Gallbladder disease Father   . Colon polyps Father   . Osteoarthritis Sister   . Thyroid disease Sister   . HIV Brother   . Breast cancer Neg Hx     Review of Systems: A 12-system review of systems was performed and was negative except as noted in the HPI.  --------------------------------------------------------------------------------------------------  Physical Exam: BP  120/70 (BP Location: Left Arm, Patient Position: Sitting, Cuff Size: Normal)   Pulse 60   Ht 5\' 1"  (1.549 m)   Wt 156 lb 2 oz (70.8 kg)   SpO2 98%   BMI 29.50 kg/m   General:  NAD. Neck: No JVD or HJR. Lungs: Clear to auscultation bilaterally without wheezes or crackles. Heart: Regular rate and rhythm without murmurs, rubs, or gallops. Abdomen: Soft, nontender, nondistended. Extremities: No lower extremity edema.  EKG:  Normal sinus rhythm with low voltage.  Otherwise, no significant abnormality.  No significant change from prior tracing on 06/22/2020.  Lab Results  Component Value Date   WBC 7.8 08/27/2018   HGB 14.7 08/27/2018   HCT 42.0 08/27/2018   MCV 91.3 08/27/2018   PLT 269 08/27/2018    Lab Results  Component Value Date   NA 139 07/01/2019   K 4.2 07/01/2019   CL 101 07/01/2019   CO2 22 07/01/2019   BUN  10 07/01/2019   CREATININE 0.67 07/01/2019   GLUCOSE 100 (H) 07/01/2019    No results found for: CHOL, HDL, LDLCALC, LDLDIRECT, TRIG, CHOLHDL  --------------------------------------------------------------------------------------------------  ASSESSMENT AND PLAN: Chronic shortness of breath with nonobstructive coronary artery disease: Overall, Janet Osborne reports stable to slightly worsening exertional dyspnea that is likely multifactorial.  I do not believe that her coronary disease is driving this, given that she has longstanding symptoms with nonobstructive disease by cardiac CTA in 2020.  I think it would be prudent to obtain an echo to exclude significant structural abnormalities contributing to this.  Underlying lung disease is likely playing a role as well.  I have encouraged Janet Osborne to follow-up with Dr. Mortimer Fries.  I encouraged her to try using CPAP regularly.  Hypertension: Blood pressure well controlled today.  Hyperlipidemia: Continue rosuvastatin 20 mg daily.  LDL 39 on most recent check through Dr. Doy Hutching in 10/2020.  Follow-up: RTC 3  months.  [name], MD 12/26/2020 10:30 AM

## 2020-12-24 NOTE — Progress Notes (Signed)
Follow-up Outpatient Visit Date: 12/26/2020  Primary Care Provider: Idelle Crouch, MD [place] [address]  Chief Complaint: Shortness of breath  HPI:  Janet Osborne is a 70 y.o. female with history of nonobstructive coronary artery disease by cardiac CTA, hypertension, hyperlipidemia, COPD, obstructive sleep apnea not compliant with CPAP, and thyroid disease, who presents for follow-up of coronary artery disease.  I last saw her in July, at which time she was still recovering from her right foot/ankle surgery.  She noted stable chronic exertional dyspnea as well as occasional atypical chest pain.  Today, Ms. Guastella reports that she has been feeling more short of breath over the last few months.  She was referred for a stress test earlier this week by Dr. Doy Hutching, though this could not be performed on account of the clinic being closed due to inclement weather.  She has not had any frank chest pain but is concerned about her worsening dyspnea Eliz generalized fatigue.  She also notes continued leg/foot pain despite 2 surgeries over the last few years.  She notes that her activity has been limited on account of this.  She was recently placed on Spiriva in addition to albuterol and Symbicort; her breathing did not improve much.  She admits to not using CPAP regularly.  She was last seen by Dr. Mortimer Fries in the pulmonary clinic a year ago.  She feels quite  --------------------------------------------------------------------------------------------------  Cardiovascular History & Procedures: Cardiovascular Problems:  Dyspnea on exertion  Atypical chest pain  Risk Factors:  Hypertension, hyperlipidemia,prior tobacco use,and age greater than 60  Cath/PCI:  None  CV Surgery:  None  EP Procedures and Devices:  None  Non-Invasive Evaluation(s):  Cardiac CTA (07/13/2019): LMCA normal.  LAD with mixed proximal and mid vessel disease with  stenosis up to 51-69% (CT FFR 0.86).  LCx and RCA normal.  Coronary calcium score 191 Agatston units.  Aortic atherosclerosis noted.  Exercise stress echocardiogram (09/15/2018, [facility]): Normal baseline LVEF (greater than 55%) with hyperdynamic function with stress. No regional wall motion abnormality. Trivial aortic regurgitation. Mild mitral and tricuspid regurgitation.   Recent CV Pertinent Labs: Lab Results  Component Value Date   K 4.2 07/01/2019   BUN 10 07/01/2019   CREATININE 0.67 07/01/2019    Past medical and surgical history were reviewed and updated in EPIC.  Current Meds  Medication Sig  . acetaminophen (TYLENOL) 500 MG tablet Take 500 mg by mouth 2 (two) times a day.  . albuterol (VENTOLIN HFA) 108 (90 Base) MCG/ACT inhaler Inhale 1-2 puffs into the lungs every 6 (six) hours as needed for wheezing or shortness of breath.  Marland Kitchen aspirin 81 MG chewable tablet Chew 81 mg by mouth daily.  . budesonide-formoterol (SYMBICORT) 160-4.5 MCG/ACT inhaler Inhale 2 puffs into the lungs 2 (two) times daily.  . Cholecalciferol (VITAMIN D) 50 MCG (2000 UT) CAPS Take 2,000 Units by mouth at bedtime.  . Cyanocobalamin (VITAMIN B-12) 5000 MCG TBDP Take 5,000 mcg by mouth at bedtime.  . diazepam (VALIUM) 5 MG tablet Take 5 mg by mouth every 6 (six) hours as needed for anxiety.  Mariane Baumgarten Sodium (DSS) 100 MG CAPS Take by mouth.  . esomeprazole (NEXIUM) 40 MG capsule Take 40 mg by mouth daily at 12 noon.  Marland Kitchen estradiol (ESTRACE) 0.5 MG tablet Take 0.5 mg by mouth at bedtime.  . gabapentin (NEURONTIN) 300 MG capsule Take 300 mg by mouth daily. Take 300mg  in the AM, and  600mg  at bedtime.  . hydrochlorothiazide (HYDRODIURIL) 25 MG tablet Take 25 mg by mouth daily.   Marland Kitchen ibuprofen (ADVIL) 800 MG tablet Take 1 tablet (800 mg total) by mouth every 6 (six) hours as needed.  Marland Kitchen levothyroxine (SYNTHROID) 100 MCG tablet Take 100 mcg by mouth daily before breakfast.  . loratadine (CLARITIN REDITABS)  10 MG dissolvable tablet Take 10 mg by mouth daily as needed for allergies.  Marland Kitchen losartan (COZAAR) 50 MG tablet Take 50 mg by mouth daily.  . Multiple Vitamins-Minerals (EYE VITAMINS PO) Take 1 tablet by mouth daily.   Marland Kitchen Propylene Glycol (SYSTANE COMPLETE OP) Place 1 drop into both eyes 2 (two) times daily as needed (dry eyes).  . rosuvastatin (CRESTOR) 20 MG tablet TAKE ONE TABLET BY MOUTH EVERY DAY  . sertraline (ZOLOFT) 25 MG tablet Take 25 mg (1 tablet) daily for 2 weeks then increase to 50 mg (2 tablets) daily  . sodium chloride (MURO 128) 5 % ophthalmic solution Place 1 drop into the left eye 2 (two) times a day.  . traMADol (ULTRAM) 50 MG tablet Take 50 mg by mouth every 6 (six) hours as needed for moderate pain.     Allergies: Macrodantin [nitrofurantoin macrocrystal] and Penicillins  Social History   Tobacco Use  . Smoking status: Former Smoker    Packs/day: 0.75    Years: 45.00    Pack years: 33.75    Types: Cigarettes    Quit date: 07/2018    Years since quitting: 2.4  . Smokeless tobacco: Never Used  . Tobacco comment: Uses nicotine patch  Vaping Use  . Vaping Use: Never used  Substance Use Topics  . Alcohol use: Not Currently    Comment: occassional  . Drug use: No    Family History  Problem Relation Age of Onset  . Hypertension Mother   . Clotting disorder Mother   . Diabetes Mother   . Dementia Mother   . Osteoarthritis Mother   . Gallbladder disease Mother   . Heart disease Mother   . Hypertension Father   . Prostate cancer Father   . COPD Father   . Osteoarthritis Father   . Gallbladder disease Father   . Colon polyps Father   . Osteoarthritis Sister   . Thyroid disease Sister   . HIV Brother   . Breast cancer Neg Hx     Review of Systems: A 12-system review of systems was performed and was negative except as noted in the HPI.  --------------------------------------------------------------------------------------------------  Physical Exam: BP  120/70 (BP Location: Left Arm, Patient Position: Sitting, Cuff Size: Normal)   Pulse 60   Ht 5\' 1"  (1.549 m)   Wt 156 lb 2 oz (70.8 kg)   SpO2 98%   BMI 29.50 kg/m   General:  NAD. Neck: No JVD or HJR. Lungs: Clear to auscultation bilaterally without wheezes or crackles. Heart: Regular rate and rhythm without murmurs, rubs, or gallops. Abdomen: Soft, nontender, nondistended. Extremities: No lower extremity edema.  EKG:  Normal sinus rhythm with low voltage.  Otherwise, no significant abnormality.  No significant change from prior tracing on 06/22/2020.  Lab Results  Component Value Date   WBC 7.8 08/27/2018   HGB 14.7 08/27/2018   HCT 42.0 08/27/2018   MCV 91.3 08/27/2018   PLT 269 08/27/2018    Lab Results  Component Value Date   NA 139 07/01/2019   K 4.2 07/01/2019   CL 101 07/01/2019   CO2 22 07/01/2019   BUN  10 07/01/2019   CREATININE 0.67 07/01/2019   GLUCOSE 100 (H) 07/01/2019    No results found for: CHOL, HDL, LDLCALC, LDLDIRECT, TRIG, CHOLHDL  --------------------------------------------------------------------------------------------------  ASSESSMENT AND PLAN: Chronic shortness of breath with nonobstructive coronary artery disease: Overall, Ms. Deer reports stable to slightly worsening exertional dyspnea that is likely multifactorial.  I do not believe that her coronary disease is driving this, given that she has longstanding symptoms with nonobstructive disease by cardiac CTA in 2020.  I think it would be prudent to obtain an echo to exclude significant structural abnormalities contributing to this.  Underlying lung disease is likely playing a role as well.  I have encouraged Ms. Mastel to follow-up with Dr. Mortimer Fries.  I encouraged her to try using CPAP regularly.  Hypertension: Blood pressure well controlled today.  Hyperlipidemia: Continue rosuvastatin 20 mg daily.  LDL 39 on most recent check through Dr. Doy Hutching in 10/2020.  Follow-up: RTC 3  months.  [name], MD 12/26/2020 10:30 AM

## 2020-12-25 DIAGNOSIS — R4189 Other symptoms and signs involving cognitive functions and awareness: Secondary | ICD-10-CM | POA: Diagnosis not present

## 2020-12-25 DIAGNOSIS — G4733 Obstructive sleep apnea (adult) (pediatric): Secondary | ICD-10-CM | POA: Diagnosis not present

## 2020-12-25 DIAGNOSIS — H9193 Unspecified hearing loss, bilateral: Secondary | ICD-10-CM | POA: Diagnosis not present

## 2020-12-25 DIAGNOSIS — R292 Abnormal reflex: Secondary | ICD-10-CM | POA: Diagnosis not present

## 2020-12-26 ENCOUNTER — Encounter: Payer: Self-pay | Admitting: Internal Medicine

## 2020-12-26 ENCOUNTER — Other Ambulatory Visit: Payer: Self-pay | Admitting: Neurology

## 2020-12-26 ENCOUNTER — Other Ambulatory Visit: Payer: Self-pay

## 2020-12-26 ENCOUNTER — Ambulatory Visit: Payer: PPO | Admitting: Internal Medicine

## 2020-12-26 ENCOUNTER — Other Ambulatory Visit (HOSPITAL_COMMUNITY): Payer: Self-pay | Admitting: Neurology

## 2020-12-26 VITALS — BP 120/70 | HR 60 | Ht 61.0 in | Wt 156.1 lb

## 2020-12-26 DIAGNOSIS — I2510 Atherosclerotic heart disease of native coronary artery without angina pectoris: Secondary | ICD-10-CM

## 2020-12-26 DIAGNOSIS — R0602 Shortness of breath: Secondary | ICD-10-CM

## 2020-12-26 DIAGNOSIS — R4189 Other symptoms and signs involving cognitive functions and awareness: Secondary | ICD-10-CM

## 2020-12-26 DIAGNOSIS — E785 Hyperlipidemia, unspecified: Secondary | ICD-10-CM

## 2020-12-26 DIAGNOSIS — I10 Essential (primary) hypertension: Secondary | ICD-10-CM

## 2020-12-26 NOTE — Patient Instructions (Signed)
As you leave our office today, you may stop at the Pulmonary desk to schedule a follow up appointment with Dr Mortimer Fries or call [phone].  Medication Instructions:  Your physician recommends that you continue on your current medications as directed. Please refer to the Current Medication list given to you today.  *If you need a refill on your cardiac medications before your next appointment, please call your pharmacy*   Lab Work: none If you have labs (blood work) drawn today and your tests are completely normal, you will receive your results only by: Marland Kitchen MyChart Message (if you have MyChart) OR . A paper copy in the mail If you have any lab test that is abnormal or we need to change your treatment, we will call you to review the results.   Testing/Procedures: Your physician has requested that you have an echocardiogram. Echocardiography is a painless test that uses sound waves to create images of your heart. It provides your doctor with information about the size and shape of your heart and how well your heart's chambers and valves are working. This procedure takes approximately one hour. There are no restrictions for this procedure. There is a possibility that an IV may need to be started during your test to inject an image enhancing agent. This is done to obtain more optimal pictures of your heart. Therefore we ask that you do at least drink some water prior to coming in to hydrate your veins.   Follow-Up: At [facility], you and your health needs are our priority.  As part of our continuing mission to provide you with exceptional heart care, we have created designated Provider Care Teams.  These Care Teams include your primary Cardiologist (physician) and Advanced Practice Providers (APPs -  Physician Assistants and Nurse Practitioners) who all work together to provide you with the care you need, when you need it.  We recommend signing up for the patient portal called "MyChart".  Sign up  information is provided on this After Visit Summary.  MyChart is used to connect with patients for Virtual Visits (Telemedicine).  Patients are able to view lab/test results, encounter notes, upcoming appointments, etc.  Non-urgent messages can be sent to your provider as well.   To learn more about what you can do with MyChart, go to [email or web address].    Your next appointment:   3 month(s)  The format for your next appointment:   In Person  Provider:   You may see [name], MD or one of the following Advanced Practice Providers on your designated Care Team:    Murray Hodgkins, NP  Christell Faith, PA-C  Marrianne Mood, PA-C  Cadence K[place], [place]  Laurann Montana, NP    Echocardiogram An echocardiogram is a test that uses sound waves (ultrasound) to produce images of the heart. Images from an echocardiogram can provide important information about:  Heart size and shape.  The size and thickness and movement of your heart's walls.  Heart muscle function and strength.  Heart valve function or if you have stenosis. Stenosis is when the heart valves are too narrow.  If blood is flowing backward through the heart valves (regurgitation).  A tumor or infectious growth around the heart valves.  Areas of heart muscle that are not working well because of poor blood flow or injury from a heart attack.  Aneurysm detection. An aneurysm is a weak or damaged part of an artery wall. The wall bulges out from the normal force of blood pumping through  the body. Tell a health care provider about:  Any allergies you have.  All medicines you are taking, including vitamins, herbs, eye drops, creams, and over-the-counter medicines.  Any blood disorders you have.  Any surgeries you have had.  Any medical conditions you have.  Whether you are pregnant or may be pregnant. What are the risks? Generally, this is a safe test. However, problems may occur, including an allergic  reaction to dye (contrast) that may be used during the test. What happens before the test? No specific preparation is needed. You may eat and drink normally. What happens during the test?  You will take off your clothes from the waist up and put on a hospital gown.  Electrodes or electrocardiogram (ECG)patches may be placed on your chest. The electrodes or patches are then connected to a device that monitors your heart rate and rhythm.  You will lie down on a table for an ultrasound exam. A gel will be applied to your chest to help sound waves pass through your skin.  A handheld device, called a transducer, will be pressed against your chest and moved over your heart. The transducer produces sound waves that travel to your heart and bounce back (or "echo" back) to the transducer. These sound waves will be captured in real-time and changed into images of your heart that can be viewed on a video monitor. The images will be recorded on a computer and reviewed by your health care provider.  You may be asked to change positions or hold your breath for a short time. This makes it easier to get different views or better views of your heart.  In some cases, you may receive contrast through an IV in one of your veins. This can improve the quality of the pictures from your heart. The procedure may vary among health care providers and hospitals.   What can I expect after the test? You may return to your normal, everyday life, including diet, activities, and medicines, unless your health care provider tells you not to do that. Follow these instructions at home:  It is up to you to get the results of your test. Ask your health care provider, or the department that is doing the test, when your results will be ready.  Keep all follow-up visits. This is important. Summary  An echocardiogram is a test that uses sound waves (ultrasound) to produce images of the heart.  Images from an echocardiogram can  provide important information about the size and shape of your heart, heart muscle function, heart valve function, and other possible heart problems.  You do not need to do anything to prepare before this test. You may eat and drink normally.  After the echocardiogram is completed, you may return to your normal, everyday life, unless your health care provider tells you not to do that. This information is not intended to replace advice given to you by your health care provider. Make sure you discuss any questions you have with your health care provider. Document Revised: 07/17/2020 Document Reviewed: 07/17/2020 Elsevier Patient Education  2021 [employer].

## 2020-12-27 ENCOUNTER — Ambulatory Visit (INDEPENDENT_AMBULATORY_CARE_PROVIDER_SITE_OTHER): Payer: PPO

## 2020-12-27 ENCOUNTER — Encounter: Payer: Self-pay | Admitting: Internal Medicine

## 2020-12-27 ENCOUNTER — Encounter: Payer: Self-pay | Admitting: Podiatry

## 2020-12-27 ENCOUNTER — Ambulatory Visit: Payer: PPO | Admitting: Podiatry

## 2020-12-27 DIAGNOSIS — M19071 Primary osteoarthritis, right ankle and foot: Secondary | ICD-10-CM

## 2020-12-27 DIAGNOSIS — T8484XA Pain due to internal orthopedic prosthetic devices, implants and grafts, initial encounter: Secondary | ICD-10-CM

## 2020-12-28 ENCOUNTER — Encounter: Payer: Self-pay | Admitting: Podiatry

## 2020-12-28 NOTE — Progress Notes (Signed)
Subjective:  Patient ID: Janet Osborne, female    DOB: 06-09-1951,  MRN: [id number]  Chief Complaint  Patient presents with  . Foot Pain    "it still swells and hurts at times but a little better"    70 y.o. female presents with the above complaint.  Patient presents with complaint of painful orthopedic hardware to the right first metatarsophalangeal fusion site.  Patient states is painful to touch.  She would like to have it removed.  She wanted know if the fusion has taken place prior to removal.  She had the surgery done in 05/21/2020.  She denies any other acute complaints she states that there is still some swelling present.  She has been wearing regular shoes without any other acute complaints.   Review of Systems: Negative except as noted in the HPI. Denies N/V/F/Ch.  Past Medical History:  Diagnosis Date  . Abdominal hernia   . Anemia   . Anxiety   . Chronic constipation   . Colon polyps   . Complication of anesthesia    coughing episodes after colonoscopy and sugical procedure 2011 (hx copd)  . COPD (chronic obstructive pulmonary disease) ([place])   . DDD (degenerative disc disease), cervical   . Depression   . Endometriosis   . Essential hypertension, benign   . Fibrocystic breast disease   . GERD (gastroesophageal reflux disease)   . H/O hand surgery 03/17/2017  . Hemorrhoids   . Hyperlipidemia   . Hypothyroidism   . Migraine   . Nontoxic multinodular goiter   . Osteoarthrosis involving, or with mention of more than one site, but not specified as generalized, multiple sites   . Tobacco use     Current Outpatient Medications:  .  acetaminophen (TYLENOL) 500 MG tablet, Take 500 mg by mouth 2 (two) times a day., Disp: , Rfl:  .  albuterol (VENTOLIN HFA) 108 (90 Base) MCG/ACT inhaler, Inhale 1-2 puffs into the lungs every 6 (six) hours as needed for wheezing or shortness of breath., Disp: , Rfl:  .  aspirin 81 MG chewable tablet, Chew 81 mg by mouth daily., Disp: ,  Rfl:  .  budesonide-formoterol (SYMBICORT) 160-4.5 MCG/ACT inhaler, Inhale 2 puffs into the lungs 2 (two) times daily., Disp: , Rfl:  .  Cholecalciferol (VITAMIN D) 50 MCG (2000 UT) CAPS, Take 2,000 Units by mouth at bedtime., Disp: , Rfl:  .  Cyanocobalamin (VITAMIN B-12) 5000 MCG TBDP, Take 5,000 mcg by mouth at bedtime., Disp: , Rfl:  .  diazepam (VALIUM) 5 MG tablet, Take 5 mg by mouth every 6 (six) hours as needed for anxiety., Disp: , Rfl:  .  Docusate Sodium (DSS) 100 MG CAPS, Take by mouth., Disp: , Rfl:  .  esomeprazole (NEXIUM) 40 MG capsule, Take 40 mg by mouth daily at 12 noon., Disp: , Rfl:  .  estradiol (ESTRACE) 0.5 MG tablet, Take 0.5 mg by mouth at bedtime., Disp: , Rfl:  .  gabapentin (NEURONTIN) 300 MG capsule, Take 300 mg by mouth daily. Take 300mg  in the AM, and 600mg  at bedtime., Disp: , Rfl:  .  hydrochlorothiazide (HYDRODIURIL) 25 MG tablet, Take 25 mg by mouth daily. , Disp: , Rfl:  .  ibuprofen (ADVIL) 800 MG tablet, Take 1 tablet (800 mg total) by mouth every 6 (six) hours as needed., Disp: 60 tablet, Rfl: 1 .  levothyroxine (SYNTHROID) 100 MCG tablet, Take 100 mcg by mouth daily before breakfast., Disp: , Rfl:  .  loratadine (CLARITIN  REDITABS) 10 MG dissolvable tablet, Take 10 mg by mouth daily as needed for allergies., Disp: , Rfl:  .  losartan (COZAAR) 50 MG tablet, Take 50 mg by mouth daily., Disp: , Rfl:  .  Multiple Vitamins-Minerals (EYE VITAMINS PO), Take 1 tablet by mouth daily. , Disp: , Rfl:  .  Propylene Glycol (SYSTANE COMPLETE OP), Place 1 drop into both eyes 2 (two) times daily as needed (dry eyes)., Disp: , Rfl:  .  rosuvastatin (CRESTOR) 20 MG tablet, TAKE ONE TABLET BY MOUTH EVERY DAY, Disp: 90 tablet, Rfl: 0 .  sertraline (ZOLOFT) 25 MG tablet, Take 25 mg (1 tablet) daily for 2 weeks then increase to 50 mg (2 tablets) daily, Disp: , Rfl:  .  sodium chloride (MURO 128) 5 % ophthalmic solution, Place 1 drop into the left eye 2 (two) times a day., Disp: ,  Rfl:  .  traMADol (ULTRAM) 50 MG tablet, Take 50 mg by mouth every 6 (six) hours as needed for moderate pain. , Disp: , Rfl:   Social History   Tobacco Use  Smoking Status Former Smoker  . Packs/day: 0.75  . Years: 45.00  . Pack years: 33.75  . Types: Cigarettes  . Quit date: 07/2018  . Years since quitting: 2.4  Smokeless Tobacco Never Used  Tobacco Comment   Uses nicotine patch    Allergies  Allergen Reactions  . Macrodantin [Nitrofurantoin Macrocrystal] Hives  . Penicillins Other (See Comments)    Did it involve swelling of the face/tongue/throat, SOB, or low BP? Unknown Did it involve sudden or severe rash/hives, skin peeling, or any reaction on the inside of your mouth or nose? Unknown Did you need to seek medical attention at a hospital or doctor's office? Unknown When did it last happen? as a child If all above answers are "NO", may proceed with cephalosporin use.    Objective:  There were no vitals filed for this visit. There is no height or weight on file to calculate BMI. Constitutional Well developed. Well nourished.  Vascular Dorsalis pedis pulses palpable bilaterally. Posterior tibial pulses palpable bilaterally. Capillary refill normal to all digits.  No cyanosis or clubbing noted. Pedal hair growth normal.  Neurologic Normal speech. Oriented to person, place, and time. Epicritic sensation to light touch grossly present bilaterally.  Dermatologic Nails well groomed and normal in appearance. No open wounds. No skin lesions.  Orthopedic:  Pain on palpation to the right first metatarsophalangeal joint.  Mild prominence of the plate noted.  No wound is noted.  No backing out of the screws or plate noted.   Radiographs: 3 views of skeletally mature adult right foot: Good consolidation and bridging noted across the fusion site.  Hardware is intact.  No signs of loosening or backing out noted. Assessment:   1. Arthritis of first metatarsophalangeal (MTP)  joint of right foot   2. Painful orthopaedic hardware [facility])    Plan:  Patient was evaluated and treated and all questions answered.  Right first metatarsophalangeal joint painful orthopedic hardware status post fusion -I explained to the patient the etiology of hardware being painful and various treatment options were extensively discussed.  I would like to assess that it is in fact fused completely across the first metatarsophalangeal joint with a CT scan.  If there is a fusion I will see her back again in 3 weeks to schedule her for orthopedic hardware removal.  Patient agrees with the plan would like to obtain CT scan. -CT scan was  ordered  No follow-ups on file.

## 2021-01-08 ENCOUNTER — Encounter (HOSPITAL_COMMUNITY): Payer: Self-pay

## 2021-01-08 ENCOUNTER — Ambulatory Visit (HOSPITAL_COMMUNITY): Payer: PPO

## 2021-01-09 ENCOUNTER — Ambulatory Visit (INDEPENDENT_AMBULATORY_CARE_PROVIDER_SITE_OTHER): Payer: PPO

## 2021-01-09 ENCOUNTER — Other Ambulatory Visit: Payer: Self-pay

## 2021-01-09 DIAGNOSIS — R0602 Shortness of breath: Secondary | ICD-10-CM

## 2021-01-09 LAB — ECHOCARDIOGRAM COMPLETE
AR max vel: 2.71 cm2
AV Area VTI: 2.64 cm2
AV Area mean vel: 2.35 cm2
AV Mean grad: 4 mmHg
AV Peak grad: 7.1 mmHg
Ao pk vel: 1.33 m/s
Area-P 1/2: 4.63 cm2
Calc EF: 61.7 %
S' Lateral: 2.6 cm
Single Plane A2C EF: 60.9 %
Single Plane A4C EF: 62 %

## 2021-01-10 ENCOUNTER — Telehealth: Payer: Self-pay | Admitting: *Deleted

## 2021-01-10 ENCOUNTER — Other Ambulatory Visit: Payer: PPO

## 2021-01-10 NOTE — Telephone Encounter (Signed)
-----   Message from [name], MD sent at 01/09/2021  9:17 PM EST ----- Please let Janet Osborne know that her echocardiogram is normal.  I do not see any findings to explain worsening shortness of breath.  I encourage her to follow-up with her lung doctor for further evaluation.

## 2021-01-10 NOTE — Telephone Encounter (Signed)
Results released to My Chart. No answer. Left detailed message with results, ok per DPR, and to call back if any questions.  Included in voice mail message that results are on MyChart for patient viewing.    

## 2021-01-14 ENCOUNTER — Ambulatory Visit
Admission: RE | Admit: 2021-01-14 | Discharge: 2021-01-14 | Disposition: A | Discharge status: Home / Self Care | Payer: PPO | Source: Ambulatory Visit | Attending: Podiatry | Admitting: Podiatry

## 2021-01-14 DIAGNOSIS — M19071 Primary osteoarthritis, right ankle and foot: Secondary | ICD-10-CM

## 2021-01-14 DIAGNOSIS — Z981 Arthrodesis status: Secondary | ICD-10-CM | POA: Diagnosis not present

## 2021-01-17 ENCOUNTER — Other Ambulatory Visit: Payer: Self-pay

## 2021-01-17 ENCOUNTER — Ambulatory Visit: Payer: PPO | Admitting: Podiatry

## 2021-01-17 ENCOUNTER — Encounter: Payer: Self-pay | Admitting: Podiatry

## 2021-01-17 DIAGNOSIS — M960 Pseudarthrosis after fusion or arthrodesis: Secondary | ICD-10-CM

## 2021-01-17 DIAGNOSIS — T8484XA Pain due to internal orthopedic prosthetic devices, implants and grafts, initial encounter: Secondary | ICD-10-CM | POA: Diagnosis not present

## 2021-01-17 DIAGNOSIS — Z01818 Encounter for other preprocedural examination: Secondary | ICD-10-CM | POA: Diagnosis not present

## 2021-01-17 NOTE — Patient Instructions (Signed)
Pre-Operative Instructions  Congratulations, you have decided to take an important step towards improving your quality of life.  You can be assured that the doctors and staff at [facility] will be with you every step of the way.  Here are some important things you should know:  1. Plan to be at the surgery center/hospital at least 1 (one) hour prior to your scheduled time, unless otherwise directed by the surgical center/hospital staff.  You must have a responsible adult accompany you, remain during the surgery and drive you home.  Make sure you have directions to the surgical center/hospital to ensure you arrive on time. 2. If you are having surgery at [facility] or [facility], you will need a copy of your medical history and physical form from your family physician within one month prior to the date of surgery. We will give you a form for your primary physician to complete.  3. We make every effort to accommodate the date you request for surgery.  However, there are times where surgery dates or times have to be moved.  We will contact you as soon as possible if a change in schedule is required.   4. No aspirin/ibuprofen for one week before surgery.  If you are on aspirin, any non-steroidal anti-inflammatory medications (Mobic, Aleve, Ibuprofen) should not be taken seven (7) days prior to your surgery.  You make take Tylenol for pain prior to surgery.  5. Medications - If you are taking daily heart and blood pressure medications, seizure, reflux, allergy, asthma, anxiety, pain or diabetes medications, make sure you notify the surgery center/hospital before the day of surgery so they can tell you which medications you should take or avoid the day of surgery. 6. No food or drink after midnight the night before surgery unless directed otherwise by surgical center/hospital staff. 7. No alcoholic beverages 24-hours prior to surgery.  No smoking 24-hours prior or 24-hours after  surgery. 8. Wear loose pants or shorts. They should be loose enough to fit over bandages, boots, and casts. 9. Don't wear slip-on shoes. Sneakers are preferred. 10. Bring your boot with you to the surgery center/hospital.  Also bring crutches or a walker if your physician has prescribed it for you.  If you do not have this equipment, it will be provided for you after surgery. 11. If you have not been contacted by the surgery center/hospital by the day before your surgery, call to confirm the date and time of your surgery. 12. Leave-time from work may vary depending on the type of surgery you have.  Appropriate arrangements should be made prior to surgery with your employer. 13. Prescriptions will be provided immediately following surgery by your doctor.  Fill these as soon as possible after surgery and take the medication as directed. Pain medications will not be refilled on weekends and must be approved by the doctor. 14. Remove nail polish on the operative foot and avoid getting pedicures prior to surgery. 15. Wash the night before surgery.  The night before surgery wash the foot and leg well with water and the antibacterial soap provided. Be sure to pay special attention to beneath the toenails and in between the toes.  Wash for at least three (3) minutes. Rinse thoroughly with water and dry well with a towel.  Perform this wash unless told not to do so by your physician.  Enclosed: 1 Ice pack (please put in freezer the night before surgery)   1 Hibiclens skin cleaner     Pre-op instructions  If you have any questions regarding the instructions, please do not hesitate to call our office.  [place]: [address], [place], [place] [address] -- [phone]  [place]: [address] [place], [place] [address] -- [phone]  [place]: [address], [place], [place] [address] -- [phone]   Website: [email or web address] 

## 2021-01-18 ENCOUNTER — Encounter: Payer: Self-pay | Admitting: Podiatry

## 2021-01-18 NOTE — Progress Notes (Signed)
Subjective:  Patient ID: Janet Osborne, female    DOB: 1951-06-09,  MRN: [id number]  Chief Complaint  Patient presents with  . Foot Pain    "its about the same"  Discuss CT results    70 y.o. female presents with the above complaint.  Patient presents with complaint of painful orthopedic hardware to the right first metatarsophalangeal fusion site.  Patient had a CT scan done to assess the consolidation/fusion.  Patient has pseudoarthrosis without any consolidation.  Given that this is greater than 6 months out we will discuss surgical options at this time with the patient today.  No other acute complaints   Review of Systems: Negative except as noted in the HPI. Denies N/V/F/Ch.  Past Medical History:  Diagnosis Date  . Abdominal hernia   . Anemia   . Anxiety   . Chronic constipation   . Colon polyps   . Complication of anesthesia    coughing episodes after colonoscopy and sugical procedure 2011 (hx copd)  . COPD (chronic obstructive pulmonary disease) ([place])   . DDD (degenerative disc disease), cervical   . Depression   . Endometriosis   . Essential hypertension, benign   . Fibrocystic breast disease   . GERD (gastroesophageal reflux disease)   . H/O hand surgery 03/17/2017  . Hemorrhoids   . Hyperlipidemia   . Hypothyroidism   . Migraine   . Nontoxic multinodular goiter   . Osteoarthrosis involving, or with mention of more than one site, but not specified as generalized, multiple sites   . Tobacco use     Current Outpatient Medications:  .  acetaminophen (TYLENOL) 500 MG tablet, Take 500 mg by mouth 2 (two) times a day., Disp: , Rfl:  .  albuterol (VENTOLIN HFA) 108 (90 Base) MCG/ACT inhaler, Inhale 1-2 puffs into the lungs every 6 (six) hours as needed for wheezing or shortness of breath., Disp: , Rfl:  .  aspirin 81 MG chewable tablet, Chew 81 mg by mouth daily., Disp: , Rfl:  .  budesonide-formoterol (SYMBICORT) 160-4.5 MCG/ACT inhaler, Inhale 2 puffs into the  lungs 2 (two) times daily., Disp: , Rfl:  .  Cholecalciferol (VITAMIN D) 50 MCG (2000 UT) CAPS, Take 2,000 Units by mouth at bedtime., Disp: , Rfl:  .  Cyanocobalamin (VITAMIN B-12) 5000 MCG TBDP, Take 5,000 mcg by mouth at bedtime., Disp: , Rfl:  .  diazepam (VALIUM) 5 MG tablet, Take 5 mg by mouth every 6 (six) hours as needed for anxiety., Disp: , Rfl:  .  Docusate Sodium (DSS) 100 MG CAPS, Take by mouth., Disp: , Rfl:  .  esomeprazole (NEXIUM) 40 MG capsule, Take 40 mg by mouth daily at 12 noon., Disp: , Rfl:  .  estradiol (ESTRACE) 0.5 MG tablet, Take 0.5 mg by mouth at bedtime., Disp: , Rfl:  .  gabapentin (NEURONTIN) 300 MG capsule, Take 300 mg by mouth daily. Take 300mg  in the AM, and 600mg  at bedtime., Disp: , Rfl:  .  hydrochlorothiazide (HYDRODIURIL) 25 MG tablet, Take 25 mg by mouth daily. , Disp: , Rfl:  .  ibuprofen (ADVIL) 800 MG tablet, Take 1 tablet (800 mg total) by mouth every 6 (six) hours as needed., Disp: 60 tablet, Rfl: 1 .  levothyroxine (SYNTHROID) 100 MCG tablet, Take 100 mcg by mouth daily before breakfast., Disp: , Rfl:  .  loratadine (CLARITIN REDITABS) 10 MG dissolvable tablet, Take 10 mg by mouth daily as needed for allergies., Disp: , Rfl:  .  losartan (  COZAAR) 50 MG tablet, Take 50 mg by mouth daily., Disp: , Rfl:  .  Multiple Vitamins-Minerals (EYE VITAMINS PO), Take 1 tablet by mouth daily. , Disp: , Rfl:  .  Propylene Glycol (SYSTANE COMPLETE OP), Place 1 drop into both eyes 2 (two) times daily as needed (dry eyes)., Disp: , Rfl:  .  rosuvastatin (CRESTOR) 20 MG tablet, TAKE ONE TABLET BY MOUTH EVERY DAY, Disp: 90 tablet, Rfl: 0 .  sertraline (ZOLOFT) 25 MG tablet, Take 25 mg (1 tablet) daily for 2 weeks then increase to 50 mg (2 tablets) daily, Disp: , Rfl:  .  sodium chloride (MURO 128) 5 % ophthalmic solution, Place 1 drop into the left eye 2 (two) times a day., Disp: , Rfl:  .  traMADol (ULTRAM) 50 MG tablet, Take 50 mg by mouth every 6 (six) hours as needed  for moderate pain. , Disp: , Rfl:   Social History   Tobacco Use  Smoking Status Former Smoker  . Packs/day: 0.75  . Years: 45.00  . Pack years: 33.75  . Types: Cigarettes  . Quit date: 07/2018  . Years since quitting: 2.5  Smokeless Tobacco Never Used  Tobacco Comment   Uses nicotine patch    Allergies  Allergen Reactions  . Macrodantin [Nitrofurantoin Macrocrystal] Hives  . Penicillins Other (See Comments)    Did it involve swelling of the face/tongue/throat, SOB, or low BP? Unknown Did it involve sudden or severe rash/hives, skin peeling, or any reaction on the inside of your mouth or nose? Unknown Did you need to seek medical attention at a hospital or doctor's office? Unknown When did it last happen? as a child If all above answers are "NO", may proceed with cephalosporin use.    Objective:  There were no vitals filed for this visit. There is no height or weight on file to calculate BMI. Constitutional Well developed. Well nourished.  Vascular Dorsalis pedis pulses palpable bilaterally. Posterior tibial pulses palpable bilaterally. Capillary refill normal to all digits.  No cyanosis or clubbing noted. Pedal hair growth normal.  Neurologic Normal speech. Oriented to person, place, and time. Epicritic sensation to light touch grossly present bilaterally.  Dermatologic Nails well groomed and normal in appearance. No open wounds. No skin lesions.  Orthopedic:  Pain on palpation to the right first metatarsophalangeal joint.  Mild prominence of the plate noted.  No wound is noted.  No backing out of the screws or plate noted.   Radiographs: 3 views of skeletally mature adult right foot: Good consolidation and bridging noted across the fusion site.  Hardware is intact.  No signs of loosening or backing out noted.  1. No definite solid osseous union across the first MTP joint. 2. Mild to moderate interphalangeal joint degenerative changes. 3. Unremarkable appearance  of the mid and hindfoot bony structures and ankle.  Assessment:   1. Pseudarthrosis after fusion or arthrodesis   2. Painful orthopaedic hardware ([place])   3. Preoperative examination    Plan:  Patient was evaluated and treated and all questions answered.  Right first metatarsophalangeal joint painful orthopedic hardware status post fusion -I explained to the patient the etiology of hardware being painful and various treatment options were extensively discussed.   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  Even though radiographically you could appreciate the consolidation in the CT scan shows that there is infected nonunion/pseudoarthrosis present.  This could  also likely be where most of her pain is associated with along with painful hardware.  I discussed all the treatment options she has available for this.  I encouraged her to utilize a bone stimulator.  However given the amount of gapping that is present on the CT scan I believe patient will benefit from revisional first metatarsophalangeal joint arthrodesis with bone graft from the calcaneus with a new orthopedic hardware fixated with plates and screws to allow for the osseous fusion.  I discussed this surgical plan in extensive detail with the patient.  Patient would like to proceed with the surgery.  She will augment this with a bone stimulator as well.  Also discussed with her that if intraoperatively she has osseous fusion we will plan on leaving it alone and taken the hardware out primarily.  Patient agrees with the plan. -She will be nonweightbearing to the right lower extremity.  This likely happened due to noncompliance at the beginning where she ambulated within the first week that could have led to gapping of the fracture site.  I discussed with her at this time that you should not you should be completely nonweightbearing so we can have better osseous fusion.   Patient states understanding. -Bone stimulator will be ordered as well. -New cam boot was dispensed -Informed surgical risk consent was reviewed and read aloud to the patient.  I reviewed the films.  I have discussed my findings with the patient in great detail.  I have discussed all risks including but not limited to infection, stiffness, scarring, limp, disability, deformity, damage to blood vessels and nerves, numbness, poor healing, need for braces, arthritis, chronic pain, amputation, death.  All benefits and realistic expectations discussed in great detail.  I have made no promises as to the outcome.  I have provided realistic expectations.  I have offered the patient a 2nd opinion, which they have declined and assured me they preferred to proceed despite the risks -A total of 33 minutes was spent in direct patient care as well as pre and post patient encounter activities.  This includes documentation as well as reviewing patient chart for labs, imaging, past medical, surgical, social, and family history as documented in the EMR.  I have reviewed medication allergies as documented in EMR.  I discussed the etiology of condition and treatment options from conservative to surgical care.  All risks and benefit of the treatment course was discussed in detail.  All questions were answered and return appointment was discussed.  Since the visit completed in an ambulatory/outpatient setting, the patient and/or parent/guardian has been advised to contact the providers office for worsening condition and seek medical treatment and/or call 911 if the patient deems either is necessary.   No follow-ups on file.

## 2021-01-21 ENCOUNTER — Other Ambulatory Visit
Admission: RE | Admit: 2021-01-21 | Discharge: 2021-01-21 | Disposition: A | Discharge status: Home / Self Care | Payer: PPO | Source: Ambulatory Visit | Attending: Podiatry | Admitting: Podiatry

## 2021-01-21 ENCOUNTER — Telehealth: Payer: Self-pay

## 2021-01-21 ENCOUNTER — Other Ambulatory Visit: Payer: Self-pay

## 2021-01-21 DIAGNOSIS — Z20822 Contact with and (suspected) exposure to covid-19: Secondary | ICD-10-CM | POA: Diagnosis not present

## 2021-01-21 LAB — SARS CORONAVIRUS 2 BY RT PCR (HOSPITAL ORDER, PERFORMED IN ~~LOC~~ HOSPITAL LAB): SARS Coronavirus 2: NEGATIVE

## 2021-01-21 NOTE — Telephone Encounter (Signed)
DOS 01/22/2021  HALLUX MPJ FUSION RT - 00511 BONE BIOPSY RT - 20900  RECEIVED AUTH FAX FROM [employer]. AUTH # [id number] FOR CPT [id number] & 20900. GOOD FROM 01/22/2021 - 04/07/2021

## 2021-01-22 ENCOUNTER — Ambulatory Visit: Payer: PPO | Admitting: Certified Registered"

## 2021-01-22 ENCOUNTER — Encounter: Payer: Self-pay | Admitting: Podiatry

## 2021-01-22 ENCOUNTER — Ambulatory Visit: Payer: PPO

## 2021-01-22 ENCOUNTER — Encounter
Admission: RE | Disposition: A | Discharge status: Home / Self Care | Payer: Self-pay | Source: Home / Self Care | Attending: Podiatry

## 2021-01-22 ENCOUNTER — Ambulatory Visit
Admission: RE | Admit: 2021-01-22 | Discharge: 2021-01-22 | Disposition: A | Discharge status: Home / Self Care | Payer: PPO | Attending: Podiatry | Admitting: Podiatry

## 2021-01-22 ENCOUNTER — Other Ambulatory Visit: Payer: Self-pay

## 2021-01-22 ENCOUNTER — Other Ambulatory Visit: Payer: Self-pay | Admitting: Podiatry

## 2021-01-22 DIAGNOSIS — Z9889 Other specified postprocedural states: Secondary | ICD-10-CM | POA: Diagnosis not present

## 2021-01-22 DIAGNOSIS — E039 Hypothyroidism, unspecified: Secondary | ICD-10-CM | POA: Insufficient documentation

## 2021-01-22 DIAGNOSIS — M960 Pseudarthrosis after fusion or arthrodesis: Secondary | ICD-10-CM | POA: Insufficient documentation

## 2021-01-22 DIAGNOSIS — Z7951 Long term (current) use of inhaled steroids: Secondary | ICD-10-CM | POA: Diagnosis not present

## 2021-01-22 DIAGNOSIS — E785 Hyperlipidemia, unspecified: Secondary | ICD-10-CM | POA: Insufficient documentation

## 2021-01-22 DIAGNOSIS — Z79890 Hormone replacement therapy (postmenopausal): Secondary | ICD-10-CM | POA: Insufficient documentation

## 2021-01-22 DIAGNOSIS — I2510 Atherosclerotic heart disease of native coronary artery without angina pectoris: Secondary | ICD-10-CM | POA: Insufficient documentation

## 2021-01-22 DIAGNOSIS — M13871 Other specified arthritis, right ankle and foot: Secondary | ICD-10-CM | POA: Diagnosis not present

## 2021-01-22 DIAGNOSIS — Z881 Allergy status to other antibiotic agents status: Secondary | ICD-10-CM | POA: Diagnosis not present

## 2021-01-22 DIAGNOSIS — Z7982 Long term (current) use of aspirin: Secondary | ICD-10-CM | POA: Diagnosis not present

## 2021-01-22 DIAGNOSIS — Z87891 Personal history of nicotine dependence: Secondary | ICD-10-CM | POA: Insufficient documentation

## 2021-01-22 DIAGNOSIS — J449 Chronic obstructive pulmonary disease, unspecified: Secondary | ICD-10-CM | POA: Diagnosis not present

## 2021-01-22 DIAGNOSIS — G4733 Obstructive sleep apnea (adult) (pediatric): Secondary | ICD-10-CM | POA: Diagnosis not present

## 2021-01-22 DIAGNOSIS — Z880 Allergy status to penicillin: Secondary | ICD-10-CM | POA: Insufficient documentation

## 2021-01-22 DIAGNOSIS — I10 Essential (primary) hypertension: Secondary | ICD-10-CM | POA: Insufficient documentation

## 2021-01-22 DIAGNOSIS — Y838 Other surgical procedures as the cause of abnormal reaction of the patient, or of later complication, without mention of misadventure at the time of the procedure: Secondary | ICD-10-CM | POA: Insufficient documentation

## 2021-01-22 DIAGNOSIS — M25474 Effusion, right foot: Secondary | ICD-10-CM | POA: Diagnosis not present

## 2021-01-22 DIAGNOSIS — Z79899 Other long term (current) drug therapy: Secondary | ICD-10-CM | POA: Diagnosis not present

## 2021-01-22 DIAGNOSIS — Z4889 Encounter for other specified surgical aftercare: Secondary | ICD-10-CM | POA: Diagnosis not present

## 2021-01-22 HISTORY — PX: GRAFT APPLICATION: SHX6696

## 2021-01-22 HISTORY — PX: HALLUX FUSION: SHX6621

## 2021-01-22 SURGERY — FUSION, JOINT, GREAT TOE
Anesthesia: General | Laterality: Right

## 2021-01-22 MED ORDER — BUPIVACAINE HCL (PF) 0.5 % IJ SOLN
INTRAMUSCULAR | Status: DC | PRN
  Administered 2021-01-22 (×2): 10 mL

## 2021-01-22 MED ORDER — FENTANYL CITRATE (PF) 100 MCG/2ML IJ SOLN: 25.0000 ug | INTRAMUSCULAR | Status: DC | PRN

## 2021-01-22 MED ORDER — PHENYLEPHRINE HCL (PRESSORS) 10 MG/ML IV SOLN
INTRAVENOUS | Status: DC | PRN
  Administered 2021-01-22 (×3): 100 ug via INTRAVENOUS

## 2021-01-22 MED ORDER — PROPOFOL 10 MG/ML IV BOLUS
INTRAVENOUS | Status: AC
  Filled 2021-01-22: qty 20

## 2021-01-22 MED ORDER — MIDAZOLAM HCL 2 MG/2ML IJ SOLN
INTRAMUSCULAR | Status: DC | PRN
  Administered 2021-01-22: 2 mg via INTRAVENOUS

## 2021-01-22 MED ORDER — ORAL CARE MOUTH RINSE: 15.0000 mL | Freq: Once | OROMUCOSAL | Status: AC

## 2021-01-22 MED ORDER — CLINDAMYCIN PHOSPHATE 900 MG/50ML IV SOLN
INTRAVENOUS | Status: AC
  Filled 2021-01-22: qty 50

## 2021-01-22 MED ORDER — MIDAZOLAM HCL 2 MG/2ML IJ SOLN
INTRAMUSCULAR | Status: AC
  Filled 2021-01-22: qty 2

## 2021-01-22 MED ORDER — OXYCODONE-ACETAMINOPHEN 10-325 MG PO TABS: 1.0000 | ORAL_TABLET | ORAL | 0 refills | Status: DC | PRN

## 2021-01-22 MED ORDER — DEXAMETHASONE SODIUM PHOSPHATE 10 MG/ML IJ SOLN
INTRAMUSCULAR | Status: DC | PRN
  Administered 2021-01-22: 8 mg via INTRAVENOUS

## 2021-01-22 MED ORDER — GLYCOPYRROLATE 0.2 MG/ML IJ SOLN
INTRAMUSCULAR | Status: DC | PRN
  Administered 2021-01-22: .2 mg via INTRAVENOUS

## 2021-01-22 MED ORDER — IBUPROFEN 800 MG PO TABS: 800.0000 mg | ORAL_TABLET | Freq: Four times a day (QID) | ORAL | 1 refills | Status: DC | PRN

## 2021-01-22 MED ORDER — FENTANYL CITRATE (PF) 100 MCG/2ML IJ SOLN
INTRAMUSCULAR | Status: DC | PRN
  Administered 2021-01-22 (×4): 50 ug via INTRAVENOUS

## 2021-01-22 MED ORDER — ACETAMINOPHEN 10 MG/ML IV SOLN
INTRAVENOUS | Status: DC | PRN
  Administered 2021-01-22: 1000 mg via INTRAVENOUS

## 2021-01-22 MED ORDER — CLINDAMYCIN PHOSPHATE 300 MG/2ML IJ SOLN: 900.0000 mg | Freq: Once | INTRAMUSCULAR | Status: DC

## 2021-01-22 MED ORDER — LACTATED RINGERS IV SOLN: INTRAVENOUS | Status: DC

## 2021-01-22 MED ORDER — CHLORHEXIDINE GLUCONATE 0.12 % MT SOLN
OROMUCOSAL | Status: AC
  Administered 2021-01-22: 15 mL via OROMUCOSAL
  Filled 2021-01-22: qty 15

## 2021-01-22 MED ORDER — CLINDAMYCIN PHOSPHATE 900 MG/50ML IV SOLN
900.0000 mg | Freq: Once | INTRAVENOUS | Status: AC
  Administered 2021-01-22: 900 mg via INTRAVENOUS

## 2021-01-22 MED ORDER — PROPOFOL 10 MG/ML IV BOLUS
INTRAVENOUS | Status: DC | PRN
  Administered 2021-01-22: 200 mg via INTRAVENOUS

## 2021-01-22 MED ORDER — ONDANSETRON HCL 4 MG/2ML IJ SOLN: 4.0000 mg | Freq: Once | INTRAMUSCULAR | Status: DC | PRN

## 2021-01-22 MED ORDER — ONDANSETRON HCL 4 MG/2ML IJ SOLN
INTRAMUSCULAR | Status: DC | PRN
  Administered 2021-01-22: 4 mg via INTRAVENOUS

## 2021-01-22 MED ORDER — LIDOCAINE HCL (CARDIAC) PF 100 MG/5ML IV SOSY
PREFILLED_SYRINGE | INTRAVENOUS | Status: DC | PRN
  Administered 2021-01-22: 60 mg via INTRAVENOUS

## 2021-01-22 MED ORDER — ACETAMINOPHEN 10 MG/ML IV SOLN
INTRAVENOUS | Status: AC
  Filled 2021-01-22: qty 100

## 2021-01-22 MED ORDER — FENTANYL CITRATE (PF) 100 MCG/2ML IJ SOLN
INTRAMUSCULAR | Status: AC
  Filled 2021-01-22: qty 2

## 2021-01-22 MED ORDER — CHLORHEXIDINE GLUCONATE 0.12 % MT SOLN: 15.0000 mL | Freq: Once | OROMUCOSAL | Status: AC

## 2021-01-22 MED ORDER — BUPIVACAINE HCL (PF) 0.5 % IJ SOLN
INTRAMUSCULAR | Status: AC
  Filled 2021-01-22: qty 30

## 2021-01-22 SURGICAL SUPPLY — 65 items
APL PRP STRL LF DISP 70% ISPRP (MISCELLANEOUS) ×1
Acumed Bone graft drill 6mm IMPLANT
BIT DRILL SURG BONE GRAFT 6 (DRILL) ×1 IMPLANT
BLADE SURG 15 STRL LF DISP TIS (BLADE) ×3 IMPLANT
BLADE SURG 15 STRL SS (BLADE) ×6
BLADE SURG MINI STRL (BLADE) IMPLANT
BNDG CMPR STD VLCR NS LF 5.8X4 (GAUZE/BANDAGES/DRESSINGS) ×1
BNDG CONFORM 2 STRL LF (GAUZE/BANDAGES/DRESSINGS) ×2 IMPLANT
BNDG CONFORM 3 STRL LF (GAUZE/BANDAGES/DRESSINGS) IMPLANT
BNDG ELASTIC 4X5.8 VLCR NS LF (GAUZE/BANDAGES/DRESSINGS) ×2 IMPLANT
BNDG ELASTIC 4X5.8 VLCR STR LF (GAUZE/BANDAGES/DRESSINGS) ×2 IMPLANT
BNDG ESMARK 4X12 TAN STRL LF (GAUZE/BANDAGES/DRESSINGS) ×2 IMPLANT
BNDG GAUZE 4.5X4.1 6PLY STRL (MISCELLANEOUS) ×2 IMPLANT
BNDG GAUZE ELAST 4 BULKY (GAUZE/BANDAGES/DRESSINGS) ×2 IMPLANT
BONE CANC CHIPS 20CC PCAN1/4 (Bone Implant) ×2 IMPLANT
BOOT STEPPER DURA MED (SOFTGOODS) ×2 IMPLANT
BUR 4X45 EGG (BURR) ×2 IMPLANT
CHIPS CANC BONE 20CC PCAN1/4 (Bone Implant) ×1 IMPLANT
CHLORAPREP W/TINT 26 (MISCELLANEOUS) ×2 IMPLANT
COVER WAND RF STERILE (DRAPES) ×2 IMPLANT
CUFF TOURN SGL QUICK 12 (TOURNIQUET CUFF) IMPLANT
CUFF TOURN SGL QUICK 18X4 (TOURNIQUET CUFF) ×2 IMPLANT
Crossroads Plate Instrument Kit ×2 IMPLANT
DRILL SURG BONE GRAFT 6 (DRILL) ×2
DURAPREP 26ML APPLICATOR (WOUND CARE) IMPLANT
DynaFORCE Plate Instrument Kit IMPLANT
ELECT BLADE 4.0 EZ CLEAN MEGAD (MISCELLANEOUS) ×2
ELECT REM PT RETURN 9FT ADLT (ELECTROSURGICAL) ×2
ELECTRODE BLDE 4.0 EZ CLN MEGD (MISCELLANEOUS) ×1 IMPLANT
ELECTRODE REM PT RTRN 9FT ADLT (ELECTROSURGICAL) ×1 IMPLANT
GAUZE SPONGE 4X4 12PLY STRL (GAUZE/BANDAGES/DRESSINGS) ×2 IMPLANT
GAUZE XEROFORM 1X8 LF (GAUZE/BANDAGES/DRESSINGS) ×2 IMPLANT
GLOVE INDICATOR 8.0 STRL GRN (GLOVE) ×2 IMPLANT
GLOVE SURG ENC MOIS LTX SZ7.5 (GLOVE) ×2 IMPLANT
GOWN STRL REUS W/ TWL XL LVL3 (GOWN DISPOSABLE) ×2 IMPLANT
GOWN STRL REUS W/TWL XL LVL3 (GOWN DISPOSABLE) ×4
HIMAX Staple Kit IMPLANT
KIT INSTRUMENT DYNAFORCE PLATE (KITS) ×2 IMPLANT
KIT TURNOVER KIT A (KITS) ×2 IMPLANT
LABEL OR SOLS (LABEL) ×2 IMPLANT
MANIFOLD NEPTUNE II (INSTRUMENTS) ×2 IMPLANT
NEEDLE FILTER BLUNT 18X 1/2SAF (NEEDLE) ×1
NEEDLE FILTER BLUNT 18X1 1/2 (NEEDLE) ×1 IMPLANT
NEEDLE HYPO 25X1 1.5 SAFETY (NEEDLE) ×2 IMPLANT
NS IRRIG 500ML POUR BTL (IV SOLUTION) ×2 IMPLANT
PACK EXTREMITY ARMC (MISCELLANEOUS) ×2 IMPLANT
PAD CAST CTTN 4X4 STRL (SOFTGOODS) ×1 IMPLANT
PADDING CAST COTTON 4X4 STRL (SOFTGOODS) ×2
PENCIL ELECTRO HAND CTR (MISCELLANEOUS) ×2 IMPLANT
PLATE CLIP DYNAFORCE MTP 20 (Plate) ×2 IMPLANT
SCREW LOCKING POLYAXIAL 3.5X16 (Screw) ×4 IMPLANT
SCREW NL MOTOBAND 3.5X16 (Screw) ×2 IMPLANT
SCREW POLYAXIL LOCK 3.5X12 (Screw) ×4 IMPLANT
STOCKINETTE M/LG 89821 (MISCELLANEOUS) ×2 IMPLANT
STRIP CLOSURE SKIN 1/4X4 (GAUZE/BANDAGES/DRESSINGS) IMPLANT
SUT MNCRL 3-0 (SUTURE) ×2 IMPLANT
SUT MNCRL 4-0 (SUTURE) ×2
SUT MNCRL 4-0 27XMFL (SUTURE) ×1
SUT MNCRL 5-0+ PC-1 (SUTURE) ×1 IMPLANT
SUT MONOCRYL 5-0 (SUTURE) ×1
SUT PROLENE 3 0 PS 2 (SUTURE) ×2 IMPLANT
SUT VIC AB 4-0 FS2 27 (SUTURE) IMPLANT
SUTURE MNCRL 4-0 27XMF (SUTURE) ×1 IMPLANT
SYR 10ML LL (SYRINGE) ×4 IMPLANT
WIRE Z .062 C-WIRE SPADE TIP (WIRE) ×2 IMPLANT

## 2021-01-22 NOTE — Transfer of Care (Signed)
Immediate Anesthesia Transfer of Care Note  Patient: Janet Osborne  Procedure(s) Performed: HALLUX FUSION MPJ  RIGHT FOOT (Right ) GRAFT APPLICATION (Right )  Patient Location: PACU  Anesthesia Type:General  Level of Consciousness: drowsy  Airway & Oxygen Therapy: Patient Spontanous Breathing and Patient connected to face mask oxygen  Post-op Assessment: Report given to RN  Post vital signs: stable  Last Vitals:  Vitals Value Taken Time  BP 129/67 01/22/21 1635  Temp    Pulse 71 01/22/21 1636  Resp 19 01/22/21 1636  SpO2 100 % 01/22/21 1636  Vitals shown include unvalidated device data.  Last Pain: There were no vitals filed for this visit.       Complications: No complications documented.

## 2021-01-22 NOTE — Anesthesia Postprocedure Evaluation (Signed)
Anesthesia Post Note  Patient: Janet Osborne  Procedure(s) Performed: HALLUX FUSION MPJ  RIGHT FOOT (Right ) GRAFT APPLICATION (Right )  Patient location during evaluation: PACU Anesthesia Type: General Level of consciousness: awake and alert Pain management: pain level controlled Vital Signs Assessment: post-procedure vital signs reviewed and stable Respiratory status: spontaneous breathing, nonlabored ventilation, respiratory function stable and patient connected to nasal cannula oxygen Cardiovascular status: blood pressure returned to baseline and stable Postop Assessment: no apparent nausea or vomiting Anesthetic complications: no   No complications documented.   Last Vitals:  Vitals:   01/22/21 1710 01/22/21 1723  BP: (!) 151/80 (!) 144/72  Pulse: 62 60  Resp: 16 16  Temp: (!) 36.3 C 36.4 C  SpO2: 99% 98%    Last Pain:  Vitals:   01/22/21 1723  TempSrc: Temporal  PainSc: 2                  Martha Clan

## 2021-01-22 NOTE — Interval H&P Note (Signed)
History and Physical Interval Note:  01/22/2021 2:31 PM  Janet Osborne  has presented today for surgery, with the diagnosis of [place].  The various methods of treatment have been discussed with the patient and family. After consideration of risks, benefits and other options for treatment, the patient has consented to  Procedure(s) with comments: [place] (Right) - WITH BLOCK GRAFT APPLICATION (Right) as a surgical intervention.  The patient's history has been reviewed, patient examined, no change in status, stable for surgery.  I have reviewed the patient's chart and labs.  Questions were answered to the patient's satisfaction.     Felipa Furnace

## 2021-01-22 NOTE — Anesthesia Procedure Notes (Signed)
Procedure Name: LMA Insertion Date/Time: 01/22/2021 3:07 PM Performed by: Lerry Liner, CRNA Pre-anesthesia Checklist: Patient identified, Emergency Drugs available, Suction available and Patient being monitored Patient Re-evaluated:Patient Re-evaluated prior to induction Oxygen Delivery Method: Circle system utilized Preoxygenation: Pre-oxygenation with 100% oxygen Induction Type: IV induction Ventilation: Mask ventilation without difficulty LMA: LMA inserted LMA Size: 3.5 Number of attempts: 1 Tube secured with: Tape Dental Injury: Teeth and Oropharynx as per pre-operative assessment

## 2021-01-22 NOTE — Anesthesia Preprocedure Evaluation (Signed)
Anesthesia Evaluation  Patient identified by MRN, date of birth, ID band Patient awake    Reviewed: Allergy & Precautions, NPO status , Patient's Chart, lab work & pertinent test results  History of Anesthesia Complications Negative for: history of anesthetic complications  Airway Mallampati: II       Dental   Pulmonary sleep apnea (not using CPAP) , COPD,  COPD inhaler, former smoker,           Cardiovascular hypertension, Pt. on medications (-) Past MI and (-) CHF (-) dysrhythmias (-) Valvular Problems/Murmurs     Neuro/Psych neg Seizures Anxiety Depression    GI/Hepatic Neg liver ROS, GERD  ,  Endo/Other  neg diabetesHypothyroidism   Renal/GU negative Renal ROS     Musculoskeletal   Abdominal   Peds  Hematology   Anesthesia Other Findings   Reproductive/Obstetrics                             Anesthesia Physical Anesthesia Plan  ASA: II  Anesthesia Plan: General   Post-op Pain Management:    Induction: Intravenous  PONV Risk Score and Plan: 3 and Ondansetron, Dexamethasone and Treatment may vary due to age or medical condition  Airway Management Planned: LMA  Additional Equipment:   Intra-op Plan:   Post-operative Plan:   Informed Consent: I have reviewed the patients History and Physical, chart, labs and discussed the procedure including the risks, benefits and alternatives for the proposed anesthesia with the patient or authorized representative who has indicated his/her understanding and acceptance.       Plan Discussed with:   Anesthesia Plan Comments:         Anesthesia Quick Evaluation

## 2021-01-22 NOTE — Discharge Instructions (Addendum)
After Surgery Instructions   1) If you are recuperating from surgery anywhere other than home, please be sure to leave [place] the number where you can be reached.  2) Go directly home and rest.  3) Keep the operated foot(feet) elevated six inches above the hip when sitting or lying down. This will help control swelling and pain.  4) Support the elevated foot and leg with pillows. DO NOT PLACE PILLOWS UNDER THE KNEE.  5) DO NOT REMOVE or get your bandages WET, unless you were given different instructions by your doctor to do so. This increases the risk of infection.  6) Wear your surgical shoe or surgical boot at all times when you are up on your feet. NON WEIGHT BEARING  7) A limited amount of pain and swelling may occur. The skin may take on a bruised appearance. DO NOT BE ALARMED, THIS IS NORMAL.  8) For slight pain and swelling, apply an ice pack directly over the bandages for 15 minutes only out of each hour of the day. Continue until seen in the office for your first post op visit. DON NOT     APPLY ANY FORM OF HEAT TO THE AREA.  9) Have prescriptions filled immediately and take as directed.  10) Drink lots of liquids, water and juice to stay hydrated.  11) CALL IMMEDIATELY IF:  *Bleeding continues until the following day of surgery  *Pain increases and/or does not respond to medication  *Bandages or cast appears to tight  *If your bandage gets wet  *Trip, fall or stump your surgical foot  *If your temperature goes above 101  *If you have ANY questions at all  [place]. ADHERING TO THESE INSTRUCTIONS WILL OFFER YOU THE MOST COMPLETE RESULTS   AMBULATORY SURGERY  DISCHARGE INSTRUCTIONS   1) The drugs that you were given will stay in your system until tomorrow so for the next 24 hours you should not:  A) Drive an automobile B) Make any legal decisions C) Drink any alcoholic beverage   2) You may resume regular meals tomorrow.  Today it is  better to start with liquids and gradually work up to solid foods.  You may eat anything you prefer, but it is better to start with liquids, then soup and crackers, and gradually work up to solid foods.   3) Please notify your doctor immediately if you have any unusual bleeding, trouble breathing, redness and pain at the surgery site, drainage, fever, or pain not relieved by medication.    4) Additional Instructions:   Please contact your physician with any problems or Same Day Surgery at [phone], Monday through Friday 6 am to 4 pm, or [place] at [facility] number at [phone].

## 2021-01-24 NOTE — Op Note (Addendum)
Surgeon: Surgeon(s): Felipa Furnace, DPM  Assistants: None Pre-operative diagnosis: PSEUDARTHROSIS AFTER FUSION ARTHRODESIS  Post-operative diagnosis: same Procedure: Procedure(s) (LRB): HALLUX FUSION MPJ  RIGHT FOOT (Right) GRAFT APPLICATION (Right)  Pathology: * No specimens in log *  Pertinent Intra-op findings: Pseudoarthrosis noted of the first metatarsophalangeal joint. Anesthesia: Choice  Hemostasis:  Total Tourniquet Time Documented: Calf (Right) - 69 minutes Total: Calf (Right) - 69 minutes  EBL: 5 mL  Materials: 3-0 Monocryl, 4-0 Monocryl, 5-0 Monocryl, 3-0 Prolene, Crossroads 4 hole plate with staple was utilized.  With 3.5 millimeter screws x4 Injectables: Half percent Marcaine plain 10 cc Complications: None  Indications for surgery: A 70 y.o. female presents with right painful pseudoarthrosis of first metatarsophalangeal joint status post fusion. Patient has failed all conservative therapy including but not limited to orthotics, injection. She wishes to have surgical correction of the foot/deformity. It was determined that patient would benefit from revisional right first metatarsophalangeal joint with use of cancellous bone graft. Informed surgical risk consent was reviewed and read aloud to the patient.  I reviewed the films.  I have discussed my findings with the patient in great detail.  I have discussed all risks including but not limited to infection, stiffness, scarring, limp, disability, deformity, damage to blood vessels and nerves, numbness, poor healing, need for braces, arthritis, chronic pain, amputation, death.  All benefits and realistic expectations discussed in great detail.  I have made no promises as to the outcome.  I have provided realistic expectations.  I have offered the patient a 2nd opinion, which they have declined and assured me they preferred to proceed despite the risks   Procedure in detail: The patient was both verbally and visually identified  by myself, the nursing staff, and anesthesia staff in the preoperative holding area. They were then transferred to the operating room and placed on the operative table in supine position.  Calf tourniquet was inflated to total of 250 mmHg pressure  Attention was first directed to the right lateral heel, intravascular fluoroscopy was utilized to confirm the position of the heel to obtain a graft.  Using #15 blade the skin was delineated down to the level of the bone and incision was carried out to the level of the bone.  A key elevator was used to reflect the periosteum and soft tissue off of the bone.  Acumed reamer was introduced through the incision and was penetrated through the cortex of the lateral heel to obtain cancellous bone graft.  This was done in standard technique according to technique guide.  No complication noted.  Adequate bone was obtained.  This will be used in the first metatarsophalangeal joint to achieve better fusion.  The wound was thoroughly irrigated and closed with 3-0 Prolene in simple interrupted suture technique.   A dorsal approach through the same incision was made just off the medial border of the EHL tendon. The incision was deepened through skin and soft tissue. The tendon was retracted laterally.  Scar tissue was noted. .  A longitudinal capsulotomy was performed. Capsule was elevated from the dorsal, medial, and lateral aspect of the first MTP joint and the joint  was fully exposed.  At this time previous hardware was noted as well.  All the hardware was removed in standard technique without complication  At this time is important that pseudoarthrosis has developed and there was not quite fusion noted. I then prepared the first MTP joint for arthrodesis using burr and currette in the standard fashion  removing all nonviable bone and soft tissue. The subchondral bone was then drilled with a small K  wire in order to prepare for arthrodesis. The first MTP joint was then  positioned for arthrodesis. Position was  held with a K wire and checked using a sterile floor as well as fluoroscopy and found to be acceptable.  Crossroads staple and plate combination was utilized to insert the crossroads staples to hold the compression in standard technique.  The position was confirmed on fluoroscopy.  Noted be excellent.  Good closing in of the fusion site noted as well after inserting the plate and staple combination.  Good correction alignment noted.  The sterile floor and fluoroscopy were once again used to  confirm the acceptable alignment of the first MTP arthrodesis. Fluoroscopy and clinical visualization also bconfirmed excellent compression at the arthrodesis site.  The incision was closed with 304 5-0 Monocryl without complication.  The tourniquet was deflated.  Cap refill noted to all digits.  The incision site was dressed with Xeroform, 4 x 4 gauze, Kerlix, Ace bandage  At the conclusion of the procedure the patient was awoken from anesthesia and found to have tolerated the procedure well any complications. There were transferred to PACU with vital signs stable and vascular status intact.  Boneta Lucks, DPM

## 2021-01-28 ENCOUNTER — Encounter: Payer: Self-pay | Admitting: Podiatry

## 2021-01-29 ENCOUNTER — Other Ambulatory Visit: Payer: Self-pay

## 2021-01-29 ENCOUNTER — Ambulatory Visit (INDEPENDENT_AMBULATORY_CARE_PROVIDER_SITE_OTHER): Payer: PPO | Admitting: Podiatry

## 2021-01-29 ENCOUNTER — Ambulatory Visit: Payer: PPO | Admitting: Internal Medicine

## 2021-01-29 ENCOUNTER — Ambulatory Visit (INDEPENDENT_AMBULATORY_CARE_PROVIDER_SITE_OTHER): Payer: PPO

## 2021-01-29 DIAGNOSIS — M19071 Primary osteoarthritis, right ankle and foot: Secondary | ICD-10-CM | POA: Diagnosis not present

## 2021-01-29 DIAGNOSIS — Z9889 Other specified postprocedural states: Secondary | ICD-10-CM | POA: Diagnosis not present

## 2021-01-29 NOTE — Progress Notes (Signed)
   Subjective:  Patient presents today status post revisional first MTPJ arthrodesis right foot. DOS: 01/22/2021.  Patient states she has no pain.  She has been very compliant and nonweightbearing using crutches in the cam boot.  No new complaints at this time  Past Medical History:  Diagnosis Date  . Abdominal hernia   . Anemia   . Anxiety   . Chronic constipation   . Colon polyps   . Complication of anesthesia    coughing episodes after colonoscopy and sugical procedure 2011 (hx copd)  . COPD (chronic obstructive pulmonary disease) ([place])   . DDD (degenerative disc disease), cervical   . Depression   . Endometriosis   . Essential hypertension, benign   . Fibrocystic breast disease   . GERD (gastroesophageal reflux disease)   . H/O hand surgery 03/17/2017  . Hemorrhoids   . Hyperlipidemia   . Hypothyroidism   . Migraine   . Nontoxic multinodular goiter   . Osteoarthrosis involving, or with mention of more than one site, but not specified as generalized, multiple sites   . Tobacco use       Objective/Physical Exam Neurovascular status intact.  Skin incisions appear to be well coapted with sutures and staples intact. No sign of infectious process noted. No significant amount of dehiscence. No active bleeding noted. Moderate edema noted to the surgical extremity.  Radiographic Exam:  Orthopedic hardware and arthrodesis site appear to be stable with routine healing.  Assessment: 1. s/p revisional first MTPJ arthrodesis right. DOS: 01/22/2021   Plan of Care:  1. Patient was evaluated. X-rays reviewed 2.  Dressings changed today.  Keep clean dry and intact x1 week 3.  Continue strict nonweightbearing in the cam boot using the crutches 4.  Return to clinic in 1 week with Dr. Dell Ponto, DPM [facility]  Dr. Edrick Kins, DPM    [address], [place] [address]                Office [phone]  Fax  [phone]

## 2021-01-30 ENCOUNTER — Ambulatory Visit: Payer: PPO | Admitting: Internal Medicine

## 2021-02-12 ENCOUNTER — Ambulatory Visit (INDEPENDENT_AMBULATORY_CARE_PROVIDER_SITE_OTHER): Payer: PPO | Admitting: Podiatry

## 2021-02-12 ENCOUNTER — Other Ambulatory Visit: Payer: Self-pay

## 2021-02-12 ENCOUNTER — Encounter: Payer: PPO | Admitting: Podiatry

## 2021-02-12 ENCOUNTER — Encounter: Payer: Self-pay | Admitting: Podiatry

## 2021-02-12 DIAGNOSIS — M19071 Primary osteoarthritis, right ankle and foot: Secondary | ICD-10-CM

## 2021-02-12 DIAGNOSIS — Z9889 Other specified postprocedural states: Secondary | ICD-10-CM

## 2021-02-12 NOTE — Progress Notes (Signed)
   Subjective:  Patient presents today status post revisional first MTPJ arthrodesis right foot. DOS: 01/22/2021.  Patient states she has no pain.  She has been very compliant and nonweightbearing using crutches in the cam boot.  No new complaints at this time  Past Medical History:  Diagnosis Date  . Abdominal hernia   . Anemia   . Anxiety   . Chronic constipation   . Colon polyps   . Complication of anesthesia    coughing episodes after colonoscopy and sugical procedure 2011 (hx copd)  . COPD (chronic obstructive pulmonary disease) ([place])   . DDD (degenerative disc disease), cervical   . Depression   . Endometriosis   . Essential hypertension, benign   . Fibrocystic breast disease   . GERD (gastroesophageal reflux disease)   . H/O hand surgery 03/17/2017  . Hemorrhoids   . Hyperlipidemia   . Hypothyroidism   . Migraine   . Nontoxic multinodular goiter   . Osteoarthrosis involving, or with mention of more than one site, but not specified as generalized, multiple sites   . Tobacco use       Objective/Physical Exam Neurovascular status intact.  Skin incisions appear to be well coapted with sutures and staples intact. No sign of infectious process noted. No significant amount of dehiscence. No active bleeding noted. Moderate edema noted to the surgical extremity.  Radiographic Exam:  Orthopedic hardware and arthrodesis site appear to be stable with routine healing.  Assessment: 1. s/p revisional first MTPJ arthrodesis right. DOS: 01/22/2021   Plan of Care:  1. Patient was evaluated. X-rays reviewed 2.  Dressings changed today.  Keep clean dry and intact x1 week 3.  Continue strict nonweightbearing in the cam boot using the crutches 4.  Return to clinic in 1 week with Dr. Posey Pronto 5.  Stitches were removed.  No clinical signs of dehiscence noted.  No clinical signs of infection noted.

## 2021-02-18 ENCOUNTER — Other Ambulatory Visit: Payer: Self-pay | Admitting: Internal Medicine

## 2021-03-02 ENCOUNTER — Other Ambulatory Visit: Payer: Self-pay | Admitting: Podiatry

## 2021-03-06 DIAGNOSIS — M19021 Primary osteoarthritis, right elbow: Secondary | ICD-10-CM | POA: Diagnosis not present

## 2021-03-06 DIAGNOSIS — M25521 Pain in right elbow: Secondary | ICD-10-CM | POA: Diagnosis not present

## 2021-03-06 DIAGNOSIS — M7711 Lateral epicondylitis, right elbow: Secondary | ICD-10-CM | POA: Diagnosis not present

## 2021-03-12 ENCOUNTER — Encounter: Payer: Self-pay | Admitting: Podiatry

## 2021-03-12 ENCOUNTER — Ambulatory Visit (INDEPENDENT_AMBULATORY_CARE_PROVIDER_SITE_OTHER): Payer: PPO | Admitting: Podiatry

## 2021-03-12 ENCOUNTER — Other Ambulatory Visit: Payer: Self-pay

## 2021-03-12 ENCOUNTER — Ambulatory Visit (INDEPENDENT_AMBULATORY_CARE_PROVIDER_SITE_OTHER): Payer: PPO

## 2021-03-12 DIAGNOSIS — Z9889 Other specified postprocedural states: Secondary | ICD-10-CM

## 2021-03-12 DIAGNOSIS — M19071 Primary osteoarthritis, right ankle and foot: Secondary | ICD-10-CM | POA: Diagnosis not present

## 2021-03-12 NOTE — Progress Notes (Signed)
Subjective:  Patient ID: Janet Osborne, female    DOB: 11/28/1951,  MRN: [id number]  Chief Complaint  Patient presents with  . Routine Post Op    DOS 01/22/2021 RT REVISIONAL 1ST MPJ FUSION W/ HARVEST OF AUTOGRAFT  "its doing good"     70 y.o. female returns for post-op check.  Patient states she is doing well.  She states that he has considerably.  She does not have much pain.  She is ambulating in regular shoes.  She is has a small superficial dehiscence/scab she wanted get evaluated.  She denies any other acute complaints.  Review of Systems: Negative except as noted in the HPI. Denies N/V/F/Ch.  Past Medical History:  Diagnosis Date  . Abdominal hernia   . Anemia   . Anxiety   . Chronic constipation   . Colon polyps   . Complication of anesthesia    coughing episodes after colonoscopy and sugical procedure 2011 (hx copd)  . COPD (chronic obstructive pulmonary disease) ([place])   . DDD (degenerative disc disease), cervical   . Depression   . Endometriosis   . Essential hypertension, benign   . Fibrocystic breast disease   . GERD (gastroesophageal reflux disease)   . H/O hand surgery 03/17/2017  . Hemorrhoids   . Hyperlipidemia   . Hypothyroidism   . Migraine   . Nontoxic multinodular goiter   . Osteoarthrosis involving, or with mention of more than one site, but not specified as generalized, multiple sites   . Tobacco use     Current Outpatient Medications:  .  acetaminophen (TYLENOL) 500 MG tablet, Take 1,000 mg by mouth daily as needed (pain)., Disp: , Rfl:  .  albuterol (VENTOLIN HFA) 108 (90 Base) MCG/ACT inhaler, Inhale 1-2 puffs into the lungs every 6 (six) hours as needed for wheezing or shortness of breath., Disp: , Rfl:  .  aspirin 81 MG chewable tablet, Chew 81 mg by mouth daily., Disp: , Rfl:  .  beta carotene w/minerals (OCUVITE) tablet, Take 1 tablet by mouth every evening., Disp: , Rfl:  .  Biotin 10000 MCG TBDP, Take 10,000 mcg by mouth daily., Disp: ,  Rfl:  .  budesonide-formoterol (SYMBICORT) 160-4.5 MCG/ACT inhaler, Inhale 2 puffs into the lungs 2 (two) times daily., Disp: , Rfl:  .  Calcium Carbonate-Vitamin D (CALCIUM 600+D PO), Take 1 tablet by mouth every evening., Disp: , Rfl:  .  Cholecalciferol (VITAMIN D) 50 MCG (2000 UT) CAPS, Take 2,000 Units by mouth at bedtime., Disp: , Rfl:  .  Coenzyme Q10-Vitamin E (QUNOL ULTRA COQ10 PO), Take 100 mg by mouth daily., Disp: , Rfl:  .  Cyanocobalamin (VITAMIN B-12) 5000 MCG TBDP, Take 5,000 mcg by mouth daily., Disp: , Rfl:  .  diazepam (VALIUM) 5 MG tablet, Take 5 mg by mouth every 6 (six) hours as needed for anxiety., Disp: , Rfl:  .  diclofenac Sodium (VOLTAREN) 1 % GEL, Apply 1 application topically at bedtime as needed (pain)., Disp: , Rfl:  .  esomeprazole (NEXIUM) 40 MG capsule, Take 40 mg by mouth in the morning and at bedtime., Disp: , Rfl:  .  estradiol (ESTRACE) 0.5 MG tablet, Take 0.5 mg by mouth at bedtime., Disp: , Rfl:  .  gabapentin (NEURONTIN) 300 MG capsule, Take 600 mg by mouth at bedtime., Disp: , Rfl:  .  hydrochlorothiazide (HYDRODIURIL) 25 MG tablet, Take 25 mg by mouth daily. , Disp: , Rfl:  .  ibuprofen (ADVIL) 200 MG tablet, Take  400-600 mg by mouth 2 (two) times daily as needed (pain)., Disp: , Rfl:  .  ibuprofen (ADVIL) 800 MG tablet, Take 1 tablet (800 mg total) by mouth every 6 (six) hours as needed. (Patient not taking: Reported on 01/18/2021), Disp: 60 tablet, Rfl: 1 .  ibuprofen (ADVIL) 800 MG tablet, TAKE ONE TABLET (800 MG) BY MOUTH EVERY 6 HOURS AS NEEDED, Disp: 60 tablet, Rfl: 1 .  levothyroxine (SYNTHROID) 100 MCG tablet, Take 100 mcg by mouth daily before breakfast., Disp: , Rfl:  .  lidocaine (LIDODERM) 5 %, Place 1 patch onto the skin daily as needed (PAIN). Remove & Discard patch within 12 hours or as directed by MD, Disp: , Rfl:  .  loratadine (CLARITIN REDITABS) 10 MG dissolvable tablet, Take 10 mg by mouth daily as needed for allergies., Disp: , Rfl:  .   losartan (COZAAR) 50 MG tablet, Take 50 mg by mouth daily., Disp: , Rfl:  .  Multiple Vitamin (MULTIVITAMIN WITH MINERALS) TABS tablet, Take 1 tablet by mouth daily., Disp: , Rfl:  .  oxyCODONE-acetaminophen (PERCOCET) 10-325 MG tablet, Take 1 tablet by mouth every 4 (four) hours as needed for pain. (Patient not taking: Reported on 01/22/2021), Disp: 30 tablet, Rfl: 0 .  Polyethyl Glycol-Propyl Glycol (SYSTANE) 0.4-0.3 % SOLN, Place 1-2 drops into both eyes 3 (three) times daily as needed (dry/irritated eyes.)., Disp: , Rfl:  .  rosuvastatin (CRESTOR) 20 MG tablet, TAKE ONE TABLET BY MOUTH EVERY DAY, Disp: 90 tablet, Rfl: 0 .  sertraline (ZOLOFT) 25 MG tablet, Take 25 mg by mouth daily., Disp: , Rfl:  .  sodium chloride (MURO 128) 5 % ophthalmic solution, Place 1 drop into the left eye 2 (two) times daily as needed for eye irritation., Disp: , Rfl:  .  SUMAtriptan (IMITREX) 100 MG tablet, Take 100 mg by mouth every 2 (two) hours as needed for migraine. May repeat in 2 hours if headache persists or recurs., Disp: , Rfl:  .  traMADol (ULTRAM) 50 MG tablet, Take 50 mg by mouth every 6 (six) hours as needed for moderate pain. , Disp: , Rfl:   Social History   Tobacco Use  Smoking Status Former Smoker  . Packs/day: 0.75  . Years: 45.00  . Pack years: 33.75  . Types: Cigarettes  . Quit date: 07/2018  . Years since quitting: 2.6  Smokeless Tobacco Never Used  Tobacco Comment   Uses nicotine patch    Allergies  Allergen Reactions  . Macrodantin [Nitrofurantoin Macrocrystal] Hives  . Penicillins Other (See Comments)    Did it involve swelling of the face/tongue/throat, SOB, or low BP? Unknown Did it involve sudden or severe rash/hives, skin peeling, or any reaction on the inside of your mouth or nose? Unknown Did you need to seek medical attention at a hospital or doctor's office? Unknown When did it last happen? as a child If all above answers are "NO", may proceed with cephalosporin use.     Objective:  There were no vitals filed for this visit. There is no height or weight on file to calculate BMI. Constitutional Well developed. Well nourished.  Vascular Foot warm and well perfused. Capillary refill normal to all digits.   Neurologic Normal speech. Oriented to person, place, and time. Epicritic sensation to light touch grossly present bilaterally.  Dermatologic  skin completely reepithelialized.  Good stiffness noted of the first metatarsophalangeal joint.  No range of motion noted at the MPJ.  IPJ range of motion excellent without  any pain  Orthopedic:  No tenderness to palpation noted about the surgical site.   Radiographs: 3 views of skeletally mature adult right foot: Good consolidation osseous fusion noted.  Hardware is intact no signs of breaking or loosening noted. Assessment:   1. Status post foot surgery   2. Arthritis of first metatarsophalangeal (MTP) joint of right foot    Plan:  Patient was evaluated and treated and all questions answered.  Left superficial dehiscence of the incision -I have asked her that she can continue to transition to regular shoes without any reservation.  I would like for her to put trouble antibiotic and a Band-Aid to keep the bases covered.  This is very minimal dehiscence measuring about 0.4 cm x 0.3 cm x 0.2 cm.  No exposure of hardware noted. -I will obtain x-rays during next clinical visit. -I discussed with her that if it is if there is redness or the dizziness continues to get worsens or if there is exposure already come see me right away or go to the emergency room.  She states understanding.  No follow-ups on file.

## 2021-04-05 ENCOUNTER — Ambulatory Visit: Payer: PPO | Admitting: Internal Medicine

## 2021-04-09 ENCOUNTER — Other Ambulatory Visit: Payer: Self-pay

## 2021-04-09 ENCOUNTER — Ambulatory Visit (INDEPENDENT_AMBULATORY_CARE_PROVIDER_SITE_OTHER): Payer: PPO

## 2021-04-09 ENCOUNTER — Ambulatory Visit: Payer: PPO | Admitting: Podiatry

## 2021-04-09 ENCOUNTER — Encounter: Payer: Self-pay | Admitting: Podiatry

## 2021-04-09 DIAGNOSIS — M19071 Primary osteoarthritis, right ankle and foot: Secondary | ICD-10-CM | POA: Diagnosis not present

## 2021-04-09 DIAGNOSIS — Z9889 Other specified postprocedural states: Secondary | ICD-10-CM | POA: Diagnosis not present

## 2021-04-09 NOTE — Progress Notes (Signed)
Subjective:  Patient ID: Janet Osborne, female    DOB: 04-03-1951,  MRN: [id number]  Chief Complaint  Patient presents with  . Routine Post Op    DOS 01/22/2021 RT REVISIONAL 1ST MPJ FUSION W/ HARVEST OF AUTOGRAFT       70 y.o. female returns for post-op check.  Patient states she is doing well.  She states that he has considerably.  She does not have much pain.  She is ambulating in regular shoes.  Her scab is completely healed up.  She denies any other acute complaints  Review of Systems: Negative except as noted in the HPI. Denies N/V/F/Ch.  Past Medical History:  Diagnosis Date  . Abdominal hernia   . Anemia   . Anxiety   . Chronic constipation   . Colon polyps   . Complication of anesthesia    coughing episodes after colonoscopy and sugical procedure 2011 (hx copd)  . COPD (chronic obstructive pulmonary disease) ([place])   . DDD (degenerative disc disease), cervical   . Depression   . Endometriosis   . Essential hypertension, benign   . Fibrocystic breast disease   . GERD (gastroesophageal reflux disease)   . H/O hand surgery 03/17/2017  . Hemorrhoids   . Hyperlipidemia   . Hypothyroidism   . Migraine   . Nontoxic multinodular goiter   . Osteoarthrosis involving, or with mention of more than one site, but not specified as generalized, multiple sites   . Tobacco use     Current Outpatient Medications:  .  acetaminophen (TYLENOL) 500 MG tablet, Take 1,000 mg by mouth daily as needed (pain)., Disp: , Rfl:  .  albuterol (VENTOLIN HFA) 108 (90 Base) MCG/ACT inhaler, Inhale 1-2 puffs into the lungs every 6 (six) hours as needed for wheezing or shortness of breath., Disp: , Rfl:  .  aspirin 81 MG chewable tablet, Chew 81 mg by mouth daily., Disp: , Rfl:  .  beta carotene w/minerals (OCUVITE) tablet, Take 1 tablet by mouth every evening., Disp: , Rfl:  .  Biotin 10000 MCG TBDP, Take 10,000 mcg by mouth daily., Disp: , Rfl:  .  budesonide-formoterol (SYMBICORT) 160-4.5  MCG/ACT inhaler, Inhale 2 puffs into the lungs 2 (two) times daily., Disp: , Rfl:  .  Calcium Carbonate-Vitamin D (CALCIUM 600+D PO), Take 1 tablet by mouth every evening., Disp: , Rfl:  .  Cholecalciferol (VITAMIN D) 50 MCG (2000 UT) CAPS, Take 2,000 Units by mouth at bedtime., Disp: , Rfl:  .  Coenzyme Q10-Vitamin E (QUNOL ULTRA COQ10 PO), Take 100 mg by mouth daily., Disp: , Rfl:  .  Cyanocobalamin (VITAMIN B-12) 5000 MCG TBDP, Take 5,000 mcg by mouth daily., Disp: , Rfl:  .  diazepam (VALIUM) 5 MG tablet, Take 5 mg by mouth every 6 (six) hours as needed for anxiety., Disp: , Rfl:  .  diclofenac Sodium (VOLTAREN) 1 % GEL, Apply 1 application topically at bedtime as needed (pain)., Disp: , Rfl:  .  esomeprazole (NEXIUM) 40 MG capsule, Take 40 mg by mouth in the morning and at bedtime., Disp: , Rfl:  .  estradiol (ESTRACE) 0.5 MG tablet, Take 0.5 mg by mouth at bedtime., Disp: , Rfl:  .  gabapentin (NEURONTIN) 300 MG capsule, Take 600 mg by mouth at bedtime., Disp: , Rfl:  .  hydrochlorothiazide (HYDRODIURIL) 25 MG tablet, Take 25 mg by mouth daily. , Disp: , Rfl:  .  ibuprofen (ADVIL) 200 MG tablet, Take 400-600 mg by mouth 2 (two) times  daily as needed (pain)., Disp: , Rfl:  .  ibuprofen (ADVIL) 800 MG tablet, Take 1 tablet (800 mg total) by mouth every 6 (six) hours as needed. (Patient not taking: Reported on 01/18/2021), Disp: 60 tablet, Rfl: 1 .  ibuprofen (ADVIL) 800 MG tablet, TAKE ONE TABLET (800 MG) BY MOUTH EVERY 6 HOURS AS NEEDED, Disp: 60 tablet, Rfl: 1 .  levothyroxine (SYNTHROID) 100 MCG tablet, Take 100 mcg by mouth daily before breakfast., Disp: , Rfl:  .  lidocaine (LIDODERM) 5 %, Place 1 patch onto the skin daily as needed (PAIN). Remove & Discard patch within 12 hours or as directed by MD, Disp: , Rfl:  .  loratadine (CLARITIN REDITABS) 10 MG dissolvable tablet, Take 10 mg by mouth daily as needed for allergies., Disp: , Rfl:  .  losartan (COZAAR) 50 MG tablet, Take 50 mg by mouth  daily., Disp: , Rfl:  .  Multiple Vitamin (MULTIVITAMIN WITH MINERALS) TABS tablet, Take 1 tablet by mouth daily., Disp: , Rfl:  .  oxyCODONE-acetaminophen (PERCOCET) 10-325 MG tablet, Take 1 tablet by mouth every 4 (four) hours as needed for pain. (Patient not taking: Reported on 01/22/2021), Disp: 30 tablet, Rfl: 0 .  Polyethyl Glycol-Propyl Glycol (SYSTANE) 0.4-0.3 % SOLN, Place 1-2 drops into both eyes 3 (three) times daily as needed (dry/irritated eyes.)., Disp: , Rfl:  .  rosuvastatin (CRESTOR) 20 MG tablet, TAKE ONE TABLET BY MOUTH EVERY DAY, Disp: 90 tablet, Rfl: 0 .  sertraline (ZOLOFT) 25 MG tablet, Take 25 mg by mouth daily., Disp: , Rfl:  .  sodium chloride (MURO 128) 5 % ophthalmic solution, Place 1 drop into the left eye 2 (two) times daily as needed for eye irritation., Disp: , Rfl:  .  SUMAtriptan (IMITREX) 100 MG tablet, Take 100 mg by mouth every 2 (two) hours as needed for migraine. May repeat in 2 hours if headache persists or recurs., Disp: , Rfl:  .  traMADol (ULTRAM) 50 MG tablet, Take 50 mg by mouth every 6 (six) hours as needed for moderate pain. , Disp: , Rfl:   Social History   Tobacco Use  Smoking Status Former Smoker  . Packs/day: 0.75  . Years: 45.00  . Pack years: 33.75  . Types: Cigarettes  . Quit date: 07/2018  . Years since quitting: 2.7  Smokeless Tobacco Never Used  Tobacco Comment   Uses nicotine patch    Allergies  Allergen Reactions  . Macrodantin [Nitrofurantoin Macrocrystal] Hives  . Penicillins Other (See Comments)    Did it involve swelling of the face/tongue/throat, SOB, or low BP? Unknown Did it involve sudden or severe rash/hives, skin peeling, or any reaction on the inside of your mouth or nose? Unknown Did you need to seek medical attention at a hospital or doctor's office? Unknown When did it last happen? as a child If all above answers are "NO", may proceed with cephalosporin use.    Objective:  There were no vitals filed for  this visit. There is no height or weight on file to calculate BMI. Constitutional Well developed. Well nourished.  Vascular Foot warm and well perfused. Capillary refill normal to all digits.   Neurologic Normal speech. Oriented to person, place, and time. Epicritic sensation to light touch grossly present bilaterally.  Dermatologic  skin completely reepithelialized.  Good stiffness noted of the first metatarsophalangeal joint.  No range of motion noted at the MPJ.  IPJ range of motion excellent without any pain  Orthopedic:  No tenderness  to palpation noted about the surgical site.   Radiographs: 3 views of skeletally mature adult right foot: Good consolidation osseous fusion noted.  Hardware is intact no signs of breaking or loosening noted. Assessment:   1. Arthritis of first metatarsophalangeal (MTP) joint of right foot   2. Status post foot surgery    Plan:  Patient was evaluated and treated and all questions answered.  Left superficial dehiscence of the incision -Clinically healed. -Have asked her to transition to regular shoes as she has already done so.  At this time I will see her back again for final checkup in 3 months.  Radiographically she does have consolidation across the first metatarsophalangeal joint  No follow-ups on file.

## 2021-05-01 DIAGNOSIS — H9193 Unspecified hearing loss, bilateral: Secondary | ICD-10-CM | POA: Diagnosis not present

## 2021-05-01 DIAGNOSIS — G4733 Obstructive sleep apnea (adult) (pediatric): Secondary | ICD-10-CM | POA: Diagnosis not present

## 2021-05-01 DIAGNOSIS — R4189 Other symptoms and signs involving cognitive functions and awareness: Secondary | ICD-10-CM | POA: Diagnosis not present

## 2021-05-02 ENCOUNTER — Other Ambulatory Visit: Payer: Self-pay | Admitting: Internal Medicine

## 2021-05-02 DIAGNOSIS — R739 Hyperglycemia, unspecified: Secondary | ICD-10-CM | POA: Diagnosis not present

## 2021-05-02 DIAGNOSIS — J9811 Atelectasis: Secondary | ICD-10-CM | POA: Diagnosis not present

## 2021-05-02 DIAGNOSIS — Z79899 Other long term (current) drug therapy: Secondary | ICD-10-CM | POA: Diagnosis not present

## 2021-05-02 DIAGNOSIS — M79641 Pain in right hand: Secondary | ICD-10-CM | POA: Diagnosis not present

## 2021-05-02 DIAGNOSIS — F419 Anxiety disorder, unspecified: Secondary | ICD-10-CM | POA: Diagnosis not present

## 2021-05-02 DIAGNOSIS — F32A Depression, unspecified: Secondary | ICD-10-CM | POA: Diagnosis not present

## 2021-05-02 DIAGNOSIS — J449 Chronic obstructive pulmonary disease, unspecified: Secondary | ICD-10-CM | POA: Diagnosis not present

## 2021-05-02 DIAGNOSIS — E039 Hypothyroidism, unspecified: Secondary | ICD-10-CM | POA: Diagnosis not present

## 2021-05-02 DIAGNOSIS — Z1231 Encounter for screening mammogram for malignant neoplasm of breast: Secondary | ICD-10-CM

## 2021-05-02 DIAGNOSIS — J431 Panlobular emphysema: Secondary | ICD-10-CM | POA: Diagnosis not present

## 2021-05-02 DIAGNOSIS — I10 Essential (primary) hypertension: Secondary | ICD-10-CM | POA: Diagnosis not present

## 2021-05-02 DIAGNOSIS — E782 Mixed hyperlipidemia: Secondary | ICD-10-CM | POA: Diagnosis not present

## 2021-06-16 ENCOUNTER — Other Ambulatory Visit: Payer: Self-pay | Admitting: Internal Medicine

## 2021-07-16 ENCOUNTER — Ambulatory Visit
Admission: RE | Admit: 2021-07-16 | Discharge: 2021-07-16 | Disposition: A | Discharge status: Home / Self Care | Payer: PPO | Source: Ambulatory Visit | Attending: Internal Medicine | Admitting: Internal Medicine

## 2021-07-16 ENCOUNTER — Ambulatory Visit (INDEPENDENT_AMBULATORY_CARE_PROVIDER_SITE_OTHER): Payer: PPO | Admitting: Podiatry

## 2021-07-16 ENCOUNTER — Ambulatory Visit (INDEPENDENT_AMBULATORY_CARE_PROVIDER_SITE_OTHER): Payer: PPO

## 2021-07-16 ENCOUNTER — Encounter: Payer: Self-pay | Admitting: Podiatry

## 2021-07-16 ENCOUNTER — Other Ambulatory Visit: Payer: Self-pay

## 2021-07-16 DIAGNOSIS — Z9889 Other specified postprocedural states: Secondary | ICD-10-CM | POA: Diagnosis not present

## 2021-07-16 DIAGNOSIS — Z1231 Encounter for screening mammogram for malignant neoplasm of breast: Secondary | ICD-10-CM | POA: Insufficient documentation

## 2021-07-16 DIAGNOSIS — T8484XA Pain due to internal orthopedic prosthetic devices, implants and grafts, initial encounter: Secondary | ICD-10-CM

## 2021-07-16 NOTE — Progress Notes (Signed)
Subjective:  Patient ID: Janet Osborne, female    DOB: 05-02-1951,  MRN: [id number]  Chief Complaint  Patient presents with   Routine Post Op    "It's okay, some days it aches."    70 y.o. female presents with the above complaint.  Patient presents with complaint of some painful orthopedic hardware to the right revision of first metatarsophalangeal joint fusion.  She states that it is not too badly painful but she wanted to get evaluated and see if she is a candidate for taking the hardware out.  Her surgery date wasDOS 01/22/2021 RT REVISIONAL 1ST MPJ FUSION W/ HARVEST OF AUTOGRAFT.  She denies any other acute complaint she is ambulating in regular shoes without any acute issues.   Review of Systems: Negative except as noted in the HPI. Denies N/V/F/Ch.  Past Medical History:  Diagnosis Date   Abdominal hernia    Anemia    Anxiety    Chronic constipation    Colon polyps    Complication of anesthesia    coughing episodes after colonoscopy and sugical procedure 2011 (hx copd)   COPD (chronic obstructive pulmonary disease) ([facility])    DDD (degenerative disc disease), cervical    Depression    Endometriosis    Essential hypertension, benign    Fibrocystic breast disease    GERD (gastroesophageal reflux disease)    H/O hand surgery 03/17/2017   Hemorrhoids    Hyperlipidemia    Hypothyroidism    Migraine    Nontoxic multinodular goiter    Osteoarthrosis involving, or with mention of more than one site, but not specified as generalized, multiple sites    Tobacco use     Current Outpatient Medications:    acetaminophen (TYLENOL) 500 MG tablet, Take 1,000 mg by mouth daily as needed (pain)., Disp: , Rfl:    albuterol (VENTOLIN HFA) 108 (90 Base) MCG/ACT inhaler, Inhale 1-2 puffs into the lungs every 6 (six) hours as needed for wheezing or shortness of breath., Disp: , Rfl:    aspirin 81 MG chewable tablet, Chew 81 mg by mouth daily., Disp: , Rfl:    beta carotene w/minerals  (OCUVITE) tablet, Take 1 tablet by mouth every evening., Disp: , Rfl:    Biotin 10000 MCG TBDP, Take 10,000 mcg by mouth daily., Disp: , Rfl:    budesonide-formoterol (SYMBICORT) 160-4.5 MCG/ACT inhaler, Inhale 2 puffs into the lungs 2 (two) times daily., Disp: , Rfl:    Calcium Carbonate-Vitamin D (CALCIUM 600+D PO), Take 1 tablet by mouth every evening., Disp: , Rfl:    Cholecalciferol (VITAMIN D) 50 MCG (2000 UT) CAPS, Take 2,000 Units by mouth at bedtime., Disp: , Rfl:    Coenzyme Q10-Vitamin E (QUNOL ULTRA COQ10 PO), Take 100 mg by mouth daily., Disp: , Rfl:    Cyanocobalamin (VITAMIN B-12) 5000 MCG TBDP, Take 5,000 mcg by mouth daily., Disp: , Rfl:    diazepam (VALIUM) 5 MG tablet, Take 5 mg by mouth every 6 (six) hours as needed for anxiety., Disp: , Rfl:    diclofenac Sodium (VOLTAREN) 1 % GEL, Apply 1 application topically at bedtime as needed (pain)., Disp: , Rfl:    esomeprazole (NEXIUM) 40 MG capsule, Take 40 mg by mouth in the morning and at bedtime., Disp: , Rfl:    estradiol (ESTRACE) 0.5 MG tablet, Take 0.5 mg by mouth at bedtime., Disp: , Rfl:    gabapentin (NEURONTIN) 300 MG capsule, Take 600 mg by mouth at bedtime., Disp: , Rfl:  hydrochlorothiazide (HYDRODIURIL) 25 MG tablet, Take 25 mg by mouth daily. , Disp: , Rfl:    ibuprofen (ADVIL) 200 MG tablet, Take 400-600 mg by mouth 2 (two) times daily as needed (pain)., Disp: , Rfl:    ibuprofen (ADVIL) 800 MG tablet, Take 1 tablet (800 mg total) by mouth every 6 (six) hours as needed. (Patient not taking: Reported on 01/18/2021), Disp: 60 tablet, Rfl: 1   ibuprofen (ADVIL) 800 MG tablet, TAKE ONE TABLET (800 MG) BY MOUTH EVERY 6 HOURS AS NEEDED, Disp: 60 tablet, Rfl: 1   levothyroxine (SYNTHROID) 100 MCG tablet, Take 100 mcg by mouth daily before breakfast., Disp: , Rfl:    lidocaine (LIDODERM) 5 %, Place 1 patch onto the skin daily as needed (PAIN). Remove & Discard patch within 12 hours or as directed by MD, Disp: , Rfl:     loratadine (CLARITIN REDITABS) 10 MG dissolvable tablet, Take 10 mg by mouth daily as needed for allergies., Disp: , Rfl:    losartan (COZAAR) 50 MG tablet, Take 50 mg by mouth daily., Disp: , Rfl:    Multiple Vitamin (MULTIVITAMIN WITH MINERALS) TABS tablet, Take 1 tablet by mouth daily., Disp: , Rfl:    oxyCODONE-acetaminophen (PERCOCET) 10-325 MG tablet, Take 1 tablet by mouth every 4 (four) hours as needed for pain. (Patient not taking: Reported on 01/22/2021), Disp: 30 tablet, Rfl: 0   Polyethyl Glycol-Propyl Glycol (SYSTANE) 0.4-0.3 % SOLN, Place 1-2 drops into both eyes 3 (three) times daily as needed (dry/irritated eyes.)., Disp: , Rfl:    rosuvastatin (CRESTOR) 20 MG tablet, Take 1 tablet (20 mg total) by mouth daily. PLEASE CALL OFFICE TO SCHEDULE APPOINTMENT PRIOR TO FURTHER REFILLS, Disp: 90 tablet, Rfl: 0   sertraline (ZOLOFT) 25 MG tablet, Take 25 mg by mouth daily., Disp: , Rfl:    sodium chloride (MURO 128) 5 % ophthalmic solution, Place 1 drop into the left eye 2 (two) times daily as needed for eye irritation., Disp: , Rfl:    SUMAtriptan (IMITREX) 100 MG tablet, Take 100 mg by mouth every 2 (two) hours as needed for migraine. May repeat in 2 hours if headache persists or recurs., Disp: , Rfl:    traMADol (ULTRAM) 50 MG tablet, Take 50 mg by mouth every 6 (six) hours as needed for moderate pain. , Disp: , Rfl:   Social History   Tobacco Use  Smoking Status Former   Packs/day: 0.75   Years: 45.00   Pack years: 33.75   Types: Cigarettes   Quit date: 07/2018   Years since quitting: 3.0  Smokeless Tobacco Never  Tobacco Comments   Uses nicotine patch    Allergies  Allergen Reactions   Macrodantin [Nitrofurantoin Macrocrystal] Hives   Penicillins Other (See Comments)    Did it involve swelling of the face/tongue/throat, SOB, or low BP? Unknown Did it involve sudden or severe rash/hives, skin peeling, or any reaction on the inside of your mouth or nose? Unknown Did you need  to seek medical attention at a hospital or doctor's office? Unknown When did it last happen?  as a child  If all above answers are "NO", may proceed with cephalosporin use.    Objective:  There were no vitals filed for this visit. There is no height or weight on file to calculate BMI. Constitutional Well developed. Well nourished.  Vascular Dorsalis pedis pulses palpable bilaterally. Posterior tibial pulses palpable bilaterally. Capillary refill normal to all digits.  No cyanosis or clubbing noted. Pedal hair growth  normal.  Neurologic Normal speech. Oriented to person, place, and time. Epicritic sensation to light touch grossly present bilaterally.  Dermatologic Nails well groomed and normal in appearance. No open wounds. No skin lesions.  Orthopedic: Pain on palpation mildly to the right first MPJ.  Pain on palpation to the hardware.  No prominent screw or plate noted.  Rigid first MPJ joint noted.  No mobility clinically appreciated   Radiographs: 3 views of skeletally mature the right foot: Consolidation noted across the first metatarsophalangeal joint.  No bony abnormalities noted.  Hardware is intact no signs of loosening or backing out noted Assessment:   1. Painful orthopaedic hardware [facility])    Plan:  Patient was evaluated and treated and all questions answered.  Painful orthopedic hardware status post right first metatarsophalangeal joint fusion provisional - The patient the etiology of painful orthopedic hardware and various treatment options were discussed.  Given that this is very mild in nature however still painful I believe patient would benefit from removal of hardware.  However we will obtain CT scan to assess the fusion if not fully consolidated we will hold off on taking the hardware.  I discussed this with patient and she states understanding -CT scan was ordered  No follow-ups on file.

## 2021-08-13 ENCOUNTER — Other Ambulatory Visit: Payer: Self-pay

## 2021-08-13 ENCOUNTER — Ambulatory Visit
Admission: RE | Admit: 2021-08-13 | Discharge: 2021-08-13 | Disposition: A | Discharge status: Home / Self Care | Payer: PPO | Source: Ambulatory Visit | Attending: Podiatry | Admitting: Podiatry

## 2021-08-13 ENCOUNTER — Ambulatory Visit: Payer: PPO | Admitting: Podiatry

## 2021-08-13 DIAGNOSIS — M79671 Pain in right foot: Secondary | ICD-10-CM | POA: Diagnosis not present

## 2021-08-13 DIAGNOSIS — Z01818 Encounter for other preprocedural examination: Secondary | ICD-10-CM | POA: Diagnosis not present

## 2021-08-13 DIAGNOSIS — T8484XA Pain due to internal orthopedic prosthetic devices, implants and grafts, initial encounter: Secondary | ICD-10-CM | POA: Insufficient documentation

## 2021-08-13 DIAGNOSIS — Z981 Arthrodesis status: Secondary | ICD-10-CM | POA: Diagnosis not present

## 2021-08-13 DIAGNOSIS — R600 Localized edema: Secondary | ICD-10-CM | POA: Diagnosis not present

## 2021-08-14 ENCOUNTER — Other Ambulatory Visit: Payer: Self-pay | Admitting: Internal Medicine

## 2021-08-14 DIAGNOSIS — J40 Bronchitis, not specified as acute or chronic: Secondary | ICD-10-CM | POA: Diagnosis not present

## 2021-08-14 DIAGNOSIS — R61 Generalized hyperhidrosis: Secondary | ICD-10-CM | POA: Diagnosis not present

## 2021-08-14 DIAGNOSIS — J431 Panlobular emphysema: Secondary | ICD-10-CM | POA: Diagnosis not present

## 2021-08-14 NOTE — Telephone Encounter (Signed)
Please schedule overdue F/U appointment. Thank you! ?

## 2021-08-16 ENCOUNTER — Other Ambulatory Visit: Payer: Self-pay

## 2021-08-16 ENCOUNTER — Ambulatory Visit: Payer: PPO | Admitting: Medical

## 2021-08-16 ENCOUNTER — Encounter: Payer: Self-pay | Admitting: Medical

## 2021-08-16 VITALS — BP 124/64 | HR 57 | Ht 61.5 in | Wt 159.0 lb

## 2021-08-16 DIAGNOSIS — Z87891 Personal history of nicotine dependence: Secondary | ICD-10-CM | POA: Diagnosis not present

## 2021-08-16 DIAGNOSIS — I10 Essential (primary) hypertension: Secondary | ICD-10-CM | POA: Diagnosis not present

## 2021-08-16 DIAGNOSIS — E782 Mixed hyperlipidemia: Secondary | ICD-10-CM

## 2021-08-16 DIAGNOSIS — R0602 Shortness of breath: Secondary | ICD-10-CM | POA: Diagnosis not present

## 2021-08-16 DIAGNOSIS — I2510 Atherosclerotic heart disease of native coronary artery without angina pectoris: Secondary | ICD-10-CM | POA: Diagnosis not present

## 2021-08-16 DIAGNOSIS — G4733 Obstructive sleep apnea (adult) (pediatric): Secondary | ICD-10-CM | POA: Diagnosis not present

## 2021-08-16 NOTE — Progress Notes (Signed)
Cardiology Office Note:    Date:  08/16/2021   ID:  Janet, Osborne 1951-09-03, MRN [id number]  PCP:  Idelle Crouch, MD  [facility] Cardiologist:  [name], MD  [facility] Electrophysiologist:  None   Referring MD: Idelle Crouch, MD   Chief Complaint: 6 month F/u  History of Present Illness:    Janet Osborne is a 71 y.o. female with a hx of nonobstructive CAD by cardiac CTA, HTN, remote tobacco use (quit 3 years ago), HLD, COPD, OSA not compliant with CPAP, thyroid disease who presents for 6 month f/u.   Exercise stress echo 2019 showed normal LVEF 55% with hyperdynamic function with stress, no WMA, trivial AI, mild MR, mild TR. Cardiac CTA 07/2019 showed LAD with mixed proximal and mid vessel disease with stenosis up to 51-69% (CT FFR 0.85), Lcx and RCA normal. Calcium score of 191, aortic atherosclerosis noted.   Last seen 12/2020 and was reporting shortness of breath. An echo was ordered. Also encouraged to see pulmonology and use CPAP regularly. Echo showed LVEF 60-65%, no WMA and normal diastolic parameters.  Today, the patient reports she has bronchitis and is on Z-pak. Besides acute shortness of breath from bronchitis, she has chronic sob but has gained weight and doesn't exercise. She has had 3 foot surgeries in the last 3 years and this has made it difficult to walk. She feels when she walks, it doesn't take much to wear her out. She hasn't been using CPAP, says the mask is uncomfortable. She has talked to her dentist regarding mouthpiece that would help her breath. She wants to get back into exercise and lose weight. She will see endocrinologist for excessive sweating. No chest pain, LLE, orthopnea, pnd. She plans on establishing with pulmonology.    Past Surgical History:  Procedure Laterality Date   ABDOMINAL HYSTERECTOMY     ANTERIOR CERVICAL DECOMP/DISCECTOMY FUSION     APPENDECTOMY     CARPAL TUNNEL RELEASE Bilateral    CHEILECTOMY Right  05/27/2019   Procedure: CHEILECTOMY;  Surgeon: Sharlotte Alamo, DPM;  Location: [facility] ORS;  Service: Podiatry;  Laterality: Right;   COLONOSCOPY     COLONOSCOPY WITH PROPOFOL N/A 07/30/2020   Procedure: COLONOSCOPY WITH PROPOFOL;  Surgeon: Toledo, Benay Pike, MD;  Location: [facility] ENDOSCOPY;  Service: Gastroenterology;  Laterality: N/A;   ESOPHAGOGASTRODUODENOSCOPY     ESOPHAGOGASTRODUODENOSCOPY (EGD) WITH PROPOFOL N/A 04/03/2017   Procedure: ESOPHAGOGASTRODUODENOSCOPY (EGD) WITH PROPOFOL;  Surgeon: Manya Silvas, MD;  Location: [facility] ENDOSCOPY;  Service: Endoscopy;  Laterality: N/A;   FOOT SURGERY     with hardware   GRAFT APPLICATION Right [id number]   Procedure: GRAFT APPLICATION;  Surgeon: Felipa Furnace, DPM;  Location: [facility] ORS;  Service: Podiatry;  Laterality: Right;   HALLUX FUSION Right 01/22/2021   Procedure: HALLUX FUSION MPJ  RIGHT FOOT;  Surgeon: Felipa Furnace, DPM;  Location: [facility] ORS;  Service: Podiatry;  Laterality: Right;  WITH BLOCK   HERNIA REPAIR     JOINT REPLACEMENT Left 01/21/2012   thumb   POLYPECTOMY     ROTATOR CUFF REPAIR Left    thumb surgery     TONSILLECTOMY     TRIGGER FINGER RELEASE Left     Current Medications: Current Meds  Medication Sig   acetaminophen (TYLENOL) 500 MG tablet Take 1,000 mg by mouth daily as needed (pain).   albuterol (VENTOLIN HFA) 108 (90 Base) MCG/ACT inhaler Inhale 1-2 puffs into the lungs every 6 (six) hours as  needed for wheezing or shortness of breath.   aspirin 81 MG chewable tablet Chew 81 mg by mouth daily.   beta carotene w/minerals (OCUVITE) tablet Take 1 tablet by mouth every evening.   Biotin 10000 MCG TBDP Take 10,000 mcg by mouth daily.   budesonide-formoterol (SYMBICORT) 160-4.5 MCG/ACT inhaler Inhale 2 puffs into the lungs 2 (two) times daily.   Calcium Carbonate-Vitamin D (CALCIUM 600+D PO) Take 1 tablet by mouth every evening.   Cholecalciferol (VITAMIN D) 50 MCG (2000 UT) CAPS Take 2,000 Units by mouth at bedtime.    Coenzyme Q10-Vitamin E (QUNOL ULTRA COQ10 PO) Take 100 mg by mouth daily.   diazepam (VALIUM) 5 MG tablet Take 5 mg by mouth every 6 (six) hours as needed for anxiety.   diclofenac Sodium (VOLTAREN) 1 % GEL Apply 1 application topically at bedtime as needed (pain).   esomeprazole (NEXIUM) 40 MG capsule Take 40 mg by mouth in the morning and at bedtime.   estradiol (ESTRACE) 0.5 MG tablet Take 0.5 mg by mouth at bedtime.   hydrochlorothiazide (HYDRODIURIL) 25 MG tablet Take 25 mg by mouth daily.    ibuprofen (ADVIL) 200 MG tablet Take 400-600 mg by mouth 2 (two) times daily as needed (pain).   levothyroxine (SYNTHROID) 100 MCG tablet Take 100 mcg by mouth daily before breakfast.   lidocaine (LIDODERM) 5 % Place 1 patch onto the skin daily as needed (PAIN). Remove & Discard patch within 12 hours or as directed by MD   loratadine (CLARITIN REDITABS) 10 MG dissolvable tablet Take 10 mg by mouth daily as needed for allergies.   losartan (COZAAR) 50 MG tablet Take 50 mg by mouth daily.   Multiple Vitamin (MULTIVITAMIN WITH MINERALS) TABS tablet Take 1 tablet by mouth daily.   Polyethyl Glycol-Propyl Glycol (SYSTANE) 0.4-0.3 % SOLN Place 1-2 drops into both eyes 3 (three) times daily as needed (dry/irritated eyes.).   rosuvastatin (CRESTOR) 20 MG tablet Take 1 tablet (20 mg total) by mouth daily. PLEASE CALL OFFICE TO SCHEDULE APPOINTMENT PRIOR TO FURTHER REFILLS   sodium chloride (MURO 128) 5 % ophthalmic solution Place 1 drop into the left eye 2 (two) times daily as needed for eye irritation.   traMADol (ULTRAM) 50 MG tablet Take 50 mg by mouth every 6 (six) hours as needed for moderate pain.      Allergies:   Macrodantin [nitrofurantoin macrocrystal] and Penicillins   Social History   Socioeconomic History   Marital status: Married    Spouse name: Not on file   Number of children: Not on file   Years of education: Not on file   Highest education level: Not on file  Occupational History   Not  on file  Tobacco Use   Smoking status: Former    Packs/day: 0.75    Years: 45.00    Pack years: 33.75    Types: Cigarettes    Quit date: 07/2018    Years since quitting: 3.1   Smokeless tobacco: Never   Tobacco comments:    Uses nicotine patch  Vaping Use   Vaping Use: Never used  Substance and Sexual Activity   Alcohol use: Not Currently    Comment: occassional   Drug use: No   Sexual activity: Not on file  Other Topics Concern   Not on file  Social History Narrative   Not on file   Social Determinants of Health   Financial Resource Strain: Not on file  Food Insecurity: Not on file  Transportation  Needs: Not on file  Physical Activity: Not on file  Stress: Not on file  Social Connections: Not on file     Family History: The patient's family history includes COPD in her father; Clotting disorder in her mother; Colon polyps in her father; Dementia in her mother; Diabetes in her mother; Gallbladder disease in her father and mother; HIV in her brother; Heart disease in her mother; Hypertension in her father and mother; Osteoarthritis in her father, mother, and sister; Prostate cancer in her father; Thyroid disease in her sister. There is no history of Breast cancer.  ROS:   Please see the history of present illness.     All other systems reviewed and are negative.  EKGs/Labs/Other Studies Reviewed:    The following studies were reviewed today:  Echo 2022  1. Left ventricular ejection fraction, by estimation, is 60 to 65%. Left  ventricular ejection fraction by 3D volume is 68 %. The left ventricle has  normal function. The left ventricle has no regional wall motion  abnormalities. Left ventricular diastolic   parameters were normal. The average left ventricular global longitudinal  strain is -22.0 %. The global longitudinal strain is normal.   2. Right ventricular systolic function is normal. The right ventricular  size is normal.   3. The mitral valve is normal in  structure. Trivial mitral valve  regurgitation.   4. The aortic valve was not well visualized. Aortic valve regurgitation  is not visualized.   5. The inferior vena cava is normal in size with greater than 50%  respiratory variability, suggesting right atrial pressure of 3 mmHg.   EKG:  EKG is  ordered today.  The ekg ordered today demonstrates SB, 57bpm, TWI III (old), no change  Recent Labs: No results found for requested labs within last 8760 hours.  Recent Lipid Panel No results found for: CHOL, TRIG, HDL, CHOLHDL, VLDL, LDLCALC, LDLDIRECT    Physical Exam:    VS:  BP 124/64 (BP Location: Left Arm, Patient Position: Sitting, Cuff Size: Normal)   Pulse (!) 57   Ht 5' 1.5" (1.562 m)   Wt 159 lb (72.1 kg)   BMI 29.56 kg/m     Wt Readings from Last 3 Encounters:  08/16/21 159 lb (72.1 kg)  01/22/21 155 lb (70.3 kg)  12/26/20 156 lb 2 oz (70.8 kg)     GEN:  Well nourished, well developed in no acute distress HEENT: Normal NECK: No JVD; No carotid bruits LYMPHATICS: No lymphadenopathy CARDIAC: bradycardia, RR, no murmurs, rubs, gallops RESPIRATORY:  Clear to auscultation without rales, wheezing or rhonchi  ABDOMEN: Soft, non-tender, non-distended MUSCULOSKELETAL:  No edema; No deformity  SKIN: Warm and dry NEUROLOGIC:  Alert and oriented x 3 PSYCHIATRIC:  Normal affect   ASSESSMENT:    1. Shortness of breath   2. CAD in native artery   3. Essential hypertension   4. Hyperlipidemia, mixed   5. History of tobacco use   6. Obstructive sleep apnea syndrome    PLAN:    In order of problems listed above:  Chronic shortness of breath H/o tobacco use/COPD OSA She is currently being treated for bronchitis. She is not using her CPAP regularly due to the mask not fitting well. She is not interested in finding another mask. She has chronic shortness of breath which is multifactorial from smoking history, COPD, prior COVID infection, and deconditioning. Echo 01/2021 showed  normal pump function and normal diastolic parameters. She plans on getting back into formal  activity and losing weight. She plans on establishing with pulmonology for COPD and can discuss OSA as well.   Nonobstructive Coronary artery disease by Coronary CT Patient denies chest pain, she has chronic shortness of breath as above. Continue Aspirin and statin  HTN BP good today. Continue Losartan and HCTZ   HLD LDL 91 04/2021. Continue crestor. I will re-check fasting lipids  Disposition: Follow up in 12 month(s) with Md/APP   Signed, Dianelly Ferran Ninfa Meeker, PA-C  08/16/2021 1:10 PM    [facility]

## 2021-08-16 NOTE — Patient Instructions (Signed)
Medication Instructions:  Your physician recommends that you continue on your current medications as directed. Please refer to the Current Medication list given to you today.  *If you need a refill on your cardiac medications before your next appointment, please call your pharmacy*   Lab Work: Your physician recommends that you return for a FASTING lipid profile: next week  If you have labs (blood work) drawn today and your tests are completely normal, you will receive your results only by: [place] (if you have MyChart) OR A paper copy in the mail If you have any lab test that is abnormal or we need to change your treatment, we will call you to review the results.   Testing/Procedures: None ordered   Follow-Up: At [facility], you and your health needs are our priority.  As part of our continuing mission to provide you with exceptional heart care, we have created designated Provider Care Teams.  These Care Teams include your primary Cardiologist (physician) and Advanced Practice Providers (APPs -  Physician Assistants and Nurse Practitioners) who all work together to provide you with the care you need, when you need it.  We recommend signing up for the patient portal called "MyChart".  Sign up information is provided on this After Visit Summary.  MyChart is used to connect with patients for Virtual Visits (Telemedicine).  Patients are able to view lab/test results, encounter notes, upcoming appointments, etc.  Non-urgent messages can be sent to your provider as well.   To learn more about what you can do with MyChart, go to [email or web address].    Your next appointment:   Your physician wants you to follow-up in: 1 year You will receive a reminder letter in the mail two months in advance. If you don't receive a letter, please call our office to schedule the follow-up appointment.   The format for your next appointment:   In Person  Provider:   You may see [name], MD or one of the following Advanced Practice Providers on your designated Care Team:   Murray Hodgkins, NP Christell Faith, PA-C Marrianne Mood, PA-C Cadence Kathlen Mody, [place]   Other Instructions N/A

## 2021-08-20 ENCOUNTER — Ambulatory Visit: Payer: PPO | Admitting: Podiatry

## 2021-08-21 ENCOUNTER — Other Ambulatory Visit: Payer: Self-pay

## 2021-08-21 ENCOUNTER — Other Ambulatory Visit (INDEPENDENT_AMBULATORY_CARE_PROVIDER_SITE_OTHER): Payer: PPO | Admitting: *Deleted

## 2021-08-21 DIAGNOSIS — E782 Mixed hyperlipidemia: Secondary | ICD-10-CM

## 2021-08-22 LAB — LIPID PANEL
Chol/HDL Ratio: 2.2 ratio (ref 0.0–4.4)
Cholesterol, Total: 160 mg/dL (ref 100–199)
HDL: 73 mg/dL (ref >39)
LDL Chol Calc (NIH): 57 mg/dL (ref 0–99)
Triglycerides: 184 mg/dL — ABNORMAL HIGH (ref 0–149)
VLDL Cholesterol Cal: 30 mg/dL (ref 5–40)

## 2021-08-27 ENCOUNTER — Ambulatory Visit: Payer: PPO | Admitting: Podiatry

## 2021-09-10 ENCOUNTER — Telehealth: Payer: Self-pay

## 2021-09-10 NOTE — Telephone Encounter (Signed)
Called and spoke to patient, patient states she is not currently using CPAP and would like to see about other options, patient also states she had COVID in may and seems her SOB has got worse since then.

## 2021-09-11 ENCOUNTER — Ambulatory Visit: Payer: PPO | Admitting: Internal Medicine

## 2021-09-11 ENCOUNTER — Encounter: Payer: Self-pay | Admitting: Internal Medicine

## 2021-09-11 ENCOUNTER — Other Ambulatory Visit: Payer: Self-pay

## 2021-09-11 VITALS — BP 110/70 | HR 70 | Temp 96.9°F | Ht 61.5 in | Wt 161.0 lb

## 2021-09-11 DIAGNOSIS — G4733 Obstructive sleep apnea (adult) (pediatric): Secondary | ICD-10-CM

## 2021-09-11 DIAGNOSIS — J449 Chronic obstructive pulmonary disease, unspecified: Secondary | ICD-10-CM

## 2021-09-11 NOTE — Patient Instructions (Addendum)
Please use CPAP more often Continue inhalers as prescribed Recommend weight loss Check ONO and 6WMT Continue Lung Cancer Screening Program

## 2021-09-11 NOTE — Progress Notes (Signed)
Name: Janet Osborne MRN: [id number] DOB: July 13, 1951     CONSULTATION DATE: 09/11/2021 REFERRING MD : Dr. Saunders Revel    CXR 2 View  The CXR was Independently Reviewed By Me  CXR reviewed-WNL, no pneumonia or fluids   CT chest 07/2019 Independently Reviewed By Me Today CT chest-WNL, no masses, very small lung nodule Need to follow up every year  CT chest 8/21 Mild centrilobular emphysema. No pleural effusion, airspace consolidation or atelectasis. Scarring is identified within the right middle lobe and lingula. There is a tiny nodule within the central right lower lobe with an equivalent diameter of 2.8 mm. Unchanged when compared with the previous exam. No new lung nodules.  PFT  09/2019 Moderate restrictive lung disease  MILD FORM OF COPD  Sleep Study 07/2019 AHI 19  CHIEF COMPLAINT:  Follow up OSA Follow-up COPD     HPI Chronic shortness of breath Chronic dyspnea exertion Seems to have worsened over the last several months due to COVID-19 infection and pneumonia Received antibiotics and prednisone in the past Uses Symbicort 2 puffs twice daily Albuterol as needed Allergic rhinitis Allergy to cats  No exacerbation at this time No evidence of heart failure at this time No evidence or signs of infection at this time No respiratory distress No fevers, chills, nausea, vomiting, diarrhea No evidence of lower extremity edema No evidence hemoptysis  Compliance report reviewed with patient in detail Patient has significant noncompliance Most likely due to her back pain Patient having hard time sleeping This is contributing to her noncompliance    PAST MEDICAL HISTORY :   has a past medical history of Abdominal hernia, Anemia, Anxiety, Chronic constipation, Colon polyps, Complication of anesthesia, COPD (chronic obstructive pulmonary disease) ([place]), DDD (degenerative disc disease), cervical, Depression, Endometriosis, Essential hypertension, benign, Fibrocystic  breast disease, GERD (gastroesophageal reflux disease), H/O hand surgery (03/17/2017), Hemorrhoids, Hyperlipidemia, Hypothyroidism, Migraine, Nontoxic multinodular goiter, Osteoarthrosis involving, or with mention of more than one site, but not specified as generalized, multiple sites, and Tobacco use.  has a past surgical history that includes Abdominal hysterectomy; Hernia repair; Rotator cuff repair (Left); Carpal tunnel release (Bilateral); Trigger finger release (Left); thumb surgery; Polypectomy; Appendectomy; Tonsillectomy; Esophagogastroduodenoscopy; Colonoscopy; Anterior cervical decomp/discectomy fusion; Esophagogastroduodenoscopy (egd) with propofol (N/A, 04/03/2017); Cheilectomy (Right, 05/27/2019); Foot surgery; Joint replacement (Left, 01/21/2012); Colonoscopy with propofol (N/A, 07/30/2020); Hallux fusion (Right, 7/85/8850); and Graft application (Right, 2/77/4128). Prior to Admission medications   Medication Sig Start Date End Date Taking? Authorizing Provider  acetaminophen (TYLENOL) 500 MG tablet Take 500 mg by mouth 2 (two) times a day.    [provider]  albuterol (VENTOLIN HFA) 108 (90 Base) MCG/ACT inhaler Inhale 1-2 puffs into the lungs every 6 (six) hours as needed for wheezing or shortness of breath.    [provider]  aspirin 81 MG chewable tablet Chew 81 mg by mouth daily.    [provider]  budesonide-formoterol (SYMBICORT) 160-4.5 MCG/ACT inhaler Inhale 2 puffs into the lungs 2 (two) times daily.    [provider]  Calcium Carbonate-Vit D-Min (CALTRATE 600+D PLUS MINERALS) 600-800 MG-UNIT CHEW Chew 1 tablet by mouth daily.    [provider]  Cholecalciferol (VITAMIN D) 50 MCG (2000 UT) CAPS Take 2,000 Units by mouth at bedtime.    [provider]  Cyanocobalamin (VITAMIN B-12) 5000 MCG TBDP Take 5,000 mcg by mouth at bedtime.    [provider]  diazepam (VALIUM) 5 MG tablet Take 5 mg by mouth every 6 (six)  hours  as needed for anxiety.    [provider]  esomeprazole (NEXIUM) 40 MG capsule Take 40 mg by mouth daily at 12 noon.    [provider]  estradiol (ESTRACE) 0.5 MG tablet Take 0.5 mg by mouth at bedtime. 04/16/19   [provider]  gabapentin (NEURONTIN) 300 MG capsule Take 300-600 mg by mouth See admin instructions. Take 300mg  in the AM, and 600mg  at bedtime. 12/26/13   [provider]  hydrochlorothiazide (HYDRODIURIL) 25 MG tablet Take 25 mg by mouth daily.  02/27/15 07/01/19  [provider]  levothyroxine (SYNTHROID) 100 MCG tablet Take 100 mcg by mouth daily before breakfast. 04/16/19   [provider]  loratadine (CLARITIN REDITABS) 10 MG dissolvable tablet Take 10 mg by mouth daily as needed for allergies.    [provider]  losartan (COZAAR) 50 MG tablet Take 50 mg by mouth daily.    [provider]  montelukast (SINGULAIR) 10 MG tablet Take 10 mg by mouth at bedtime. 04/08/19   [provider]  Multiple Vitamins-Minerals (EYE VITAMINS PO) Take 1 tablet by mouth daily.     [provider]  oxyCODONE (ROXICODONE) 5 MG immediate release tablet Take 1 tablet (5 mg total) by mouth every 4 (four) hours as needed. 05/27/19 05/26/20  Sharlotte Alamo, DPM  pravastatin (PRAVACHOL) 40 MG tablet Take 40 mg by mouth every evening.     [provider]  Propylene Glycol (SYSTANE COMPLETE OP) Place 1 drop into both eyes 2 (two) times daily as needed (dry eyes).    [provider]  sodium chloride (MURO 128) 5 % ophthalmic solution Place 1 drop into the left eye 2 (two) times a day.    [provider]  traMADol (ULTRAM) 50 MG tablet Take 50 mg by mouth every 6 (six) hours as needed for moderate pain.     [provider]   Allergies  Allergen Reactions   Macrodantin [Nitrofurantoin Macrocrystal] Hives   Penicillins Other (See Comments)    Did it involve swelling of the face/tongue/throat, SOB,  or low BP? Unknown Did it involve sudden or severe rash/hives, skin peeling, or any reaction on the inside of your mouth or nose? Unknown Did you need to seek medical attention at a hospital or doctor's office? Unknown When did it last happen?  as a child  If all above answers are "NO", may proceed with cephalosporin use.      Review of Systems:  Gen:  Denies  fever, sweats, chills weight loss  HEENT: Denies blurred vision, double vision, ear pain, eye pain, hearing loss, nose bleeds, sore throat Cardiac:  No dizziness, chest pain or heaviness, chest tightness,edema, No JVD Resp:   No cough, -sputum production, +shortness of breath,-wheezing, -hemoptysis,  Other:  All other systems negative   BP 110/70 (BP Location: Left Arm, Patient Position: Sitting, Cuff Size: Normal)   Pulse 70   Temp (!) 96.9 F (36.1 C)   Ht 5' 1.5" (1.562 m)   Wt 161 lb (73 kg)   SpO2 97%   BMI 29.93 kg/m    Physical Examination:   General Appearance: No distress  EYES PERRLA, EOM intact.   NECK Supple, No JVD Pulmonary: normal breath sounds, No wheezing.  CardiovascularNormal S1,S2.  No m/r/g.    ALL OTHER ROS ARE NEGATIVE     ASSESSMENT AND PLAN SYNOPSIS  70 year old female with COPD mild with interstitial restrictive lung disease with a previous history of sleep apnea  very noncompliant with a recent diagnosis of bronchitis and COVID-19 infection  COPD Will need follow-up PFTs in 1 year Shortness of breath and dyspnea exertion related to COPD and deconditioned state Check overnight pulse oximetry to assess for nocturnal hypoxia Check 6-minute walk test to assess for exertional hypoxia  Continue Symbicort as prescribed   Moderate to severe OSA Patient has very poor compliance Patient has back pain that is causing her to get restless sleep  Former smoker 1 pack a day for 50 years Follow-up CT chest for lung cancer screening program    MEDICATION ADJUSTMENTS/LABS AND TESTS  ORDERED: Please use CPAP more often Continue inhalers as prescribed Recommend weight loss Check ONO and 6WMT Continue Lung Cancer Screening Program   CURRENT MEDICATIONS REVIEWED AT LENGTH WITH PATIENT TODAY   Patient  satisfied with Plan of action and management. All questions answered  Follow up 1 year  Total Time Spent 32 mins   Walburga Hudman Patricia Pesa, M.D.  [facility]  Medical Director [place] Director [facility] Cardio-Pulmonary Department

## 2021-10-08 DIAGNOSIS — D225 Melanocytic nevi of trunk: Secondary | ICD-10-CM | POA: Diagnosis not present

## 2021-10-08 DIAGNOSIS — D2271 Melanocytic nevi of right lower limb, including hip: Secondary | ICD-10-CM | POA: Diagnosis not present

## 2021-10-08 DIAGNOSIS — D2272 Melanocytic nevi of left lower limb, including hip: Secondary | ICD-10-CM | POA: Diagnosis not present

## 2021-10-08 DIAGNOSIS — L821 Other seborrheic keratosis: Secondary | ICD-10-CM | POA: Diagnosis not present

## 2021-10-08 DIAGNOSIS — D2262 Melanocytic nevi of left upper limb, including shoulder: Secondary | ICD-10-CM | POA: Diagnosis not present

## 2021-10-08 DIAGNOSIS — D2261 Melanocytic nevi of right upper limb, including shoulder: Secondary | ICD-10-CM | POA: Diagnosis not present

## 2021-10-24 DIAGNOSIS — H04123 Dry eye syndrome of bilateral lacrimal glands: Secondary | ICD-10-CM | POA: Diagnosis not present

## 2021-11-25 ENCOUNTER — Other Ambulatory Visit: Payer: Self-pay | Admitting: Internal Medicine

## 2021-12-26 DIAGNOSIS — F419 Anxiety disorder, unspecified: Secondary | ICD-10-CM | POA: Diagnosis not present

## 2021-12-26 DIAGNOSIS — M2550 Pain in unspecified joint: Secondary | ICD-10-CM | POA: Diagnosis not present

## 2021-12-26 DIAGNOSIS — Z0000 Encounter for general adult medical examination without abnormal findings: Secondary | ICD-10-CM | POA: Diagnosis not present

## 2021-12-26 DIAGNOSIS — R739 Hyperglycemia, unspecified: Secondary | ICD-10-CM | POA: Diagnosis not present

## 2021-12-26 DIAGNOSIS — E039 Hypothyroidism, unspecified: Secondary | ICD-10-CM | POA: Diagnosis not present

## 2021-12-26 DIAGNOSIS — J431 Panlobular emphysema: Secondary | ICD-10-CM | POA: Diagnosis not present

## 2021-12-26 DIAGNOSIS — Z79899 Other long term (current) drug therapy: Secondary | ICD-10-CM | POA: Diagnosis not present

## 2021-12-26 DIAGNOSIS — E782 Mixed hyperlipidemia: Secondary | ICD-10-CM | POA: Diagnosis not present

## 2021-12-26 DIAGNOSIS — I10 Essential (primary) hypertension: Secondary | ICD-10-CM | POA: Diagnosis not present

## 2021-12-26 DIAGNOSIS — F32A Depression, unspecified: Secondary | ICD-10-CM | POA: Diagnosis not present

## 2022-01-08 DIAGNOSIS — R519 Headache, unspecified: Secondary | ICD-10-CM | POA: Diagnosis not present

## 2022-01-08 DIAGNOSIS — R6889 Other general symptoms and signs: Secondary | ICD-10-CM | POA: Diagnosis not present

## 2022-01-08 DIAGNOSIS — J029 Acute pharyngitis, unspecified: Secondary | ICD-10-CM | POA: Diagnosis not present

## 2022-01-08 DIAGNOSIS — R051 Acute cough: Secondary | ICD-10-CM | POA: Diagnosis not present

## 2022-01-09 DIAGNOSIS — E039 Hypothyroidism, unspecified: Secondary | ICD-10-CM | POA: Diagnosis not present

## 2022-01-09 DIAGNOSIS — E782 Mixed hyperlipidemia: Secondary | ICD-10-CM | POA: Diagnosis not present

## 2022-01-09 DIAGNOSIS — M2550 Pain in unspecified joint: Secondary | ICD-10-CM | POA: Diagnosis not present

## 2022-01-09 DIAGNOSIS — R739 Hyperglycemia, unspecified: Secondary | ICD-10-CM | POA: Diagnosis not present

## 2022-01-09 DIAGNOSIS — Z79899 Other long term (current) drug therapy: Secondary | ICD-10-CM | POA: Diagnosis not present

## 2022-01-09 DIAGNOSIS — I10 Essential (primary) hypertension: Secondary | ICD-10-CM | POA: Diagnosis not present

## 2022-01-14 ENCOUNTER — Other Ambulatory Visit: Payer: Self-pay | Admitting: *Deleted

## 2022-01-14 DIAGNOSIS — Z87891 Personal history of nicotine dependence: Secondary | ICD-10-CM

## 2022-01-30 ENCOUNTER — Other Ambulatory Visit: Payer: Self-pay

## 2022-01-30 ENCOUNTER — Ambulatory Visit
Admission: RE | Admit: 2022-01-30 | Discharge: 2022-01-30 | Disposition: A | Discharge status: Home / Self Care | Payer: PPO | Source: Ambulatory Visit | Attending: Acute Care | Admitting: Acute Care

## 2022-01-30 DIAGNOSIS — Z87891 Personal history of nicotine dependence: Secondary | ICD-10-CM | POA: Diagnosis not present

## 2022-02-03 ENCOUNTER — Other Ambulatory Visit: Payer: Self-pay

## 2022-02-03 DIAGNOSIS — Z87891 Personal history of nicotine dependence: Secondary | ICD-10-CM

## 2022-06-19 ENCOUNTER — Other Ambulatory Visit: Payer: Self-pay | Admitting: Internal Medicine

## 2022-06-26 DIAGNOSIS — R739 Hyperglycemia, unspecified: Secondary | ICD-10-CM | POA: Diagnosis not present

## 2022-06-26 DIAGNOSIS — Z79899 Other long term (current) drug therapy: Secondary | ICD-10-CM | POA: Diagnosis not present

## 2022-06-26 DIAGNOSIS — M25511 Pain in right shoulder: Secondary | ICD-10-CM | POA: Diagnosis not present

## 2022-06-26 DIAGNOSIS — E7800 Pure hypercholesterolemia, unspecified: Secondary | ICD-10-CM | POA: Diagnosis not present

## 2022-06-26 DIAGNOSIS — E039 Hypothyroidism, unspecified: Secondary | ICD-10-CM | POA: Diagnosis not present

## 2022-06-26 DIAGNOSIS — I10 Essential (primary) hypertension: Secondary | ICD-10-CM | POA: Diagnosis not present

## 2022-06-26 DIAGNOSIS — Z1231 Encounter for screening mammogram for malignant neoplasm of breast: Secondary | ICD-10-CM | POA: Diagnosis not present

## 2022-06-26 DIAGNOSIS — J431 Panlobular emphysema: Secondary | ICD-10-CM | POA: Diagnosis not present

## 2022-06-27 ENCOUNTER — Other Ambulatory Visit: Payer: Self-pay | Admitting: Internal Medicine

## 2022-06-27 DIAGNOSIS — Z1231 Encounter for screening mammogram for malignant neoplasm of breast: Secondary | ICD-10-CM

## 2022-07-16 DIAGNOSIS — M25511 Pain in right shoulder: Secondary | ICD-10-CM | POA: Diagnosis not present

## 2022-07-16 DIAGNOSIS — M25521 Pain in right elbow: Secondary | ICD-10-CM | POA: Diagnosis not present

## 2022-07-16 DIAGNOSIS — M7521 Bicipital tendinitis, right shoulder: Secondary | ICD-10-CM | POA: Diagnosis not present

## 2022-07-16 DIAGNOSIS — M19021 Primary osteoarthritis, right elbow: Secondary | ICD-10-CM | POA: Diagnosis not present

## 2022-07-16 DIAGNOSIS — M7711 Lateral epicondylitis, right elbow: Secondary | ICD-10-CM | POA: Diagnosis not present

## 2022-07-21 ENCOUNTER — Other Ambulatory Visit: Payer: Self-pay | Admitting: Orthopedic Surgery

## 2022-07-21 DIAGNOSIS — M7711 Lateral epicondylitis, right elbow: Secondary | ICD-10-CM

## 2022-07-21 DIAGNOSIS — M19021 Primary osteoarthritis, right elbow: Secondary | ICD-10-CM

## 2022-07-30 ENCOUNTER — Ambulatory Visit
Admission: RE | Admit: 2022-07-30 | Discharge: 2022-07-30 | Disposition: A | Discharge status: Home / Self Care | Payer: PPO | Source: Ambulatory Visit | Attending: Orthopedic Surgery | Admitting: Orthopedic Surgery

## 2022-07-30 DIAGNOSIS — M25421 Effusion, right elbow: Secondary | ICD-10-CM | POA: Diagnosis not present

## 2022-07-30 DIAGNOSIS — R600 Localized edema: Secondary | ICD-10-CM | POA: Diagnosis not present

## 2022-07-30 DIAGNOSIS — M7711 Lateral epicondylitis, right elbow: Secondary | ICD-10-CM | POA: Insufficient documentation

## 2022-07-30 DIAGNOSIS — M19021 Primary osteoarthritis, right elbow: Secondary | ICD-10-CM | POA: Diagnosis not present

## 2022-08-27 DIAGNOSIS — R42 Dizziness and giddiness: Secondary | ICD-10-CM | POA: Diagnosis not present

## 2022-08-27 DIAGNOSIS — J3489 Other specified disorders of nose and nasal sinuses: Secondary | ICD-10-CM | POA: Diagnosis not present

## 2022-08-27 DIAGNOSIS — Z23 Encounter for immunization: Secondary | ICD-10-CM | POA: Diagnosis not present

## 2022-09-18 ENCOUNTER — Ambulatory Visit: Payer: PPO | Admitting: Internal Medicine

## 2022-09-18 ENCOUNTER — Encounter: Payer: Self-pay | Admitting: Internal Medicine

## 2022-09-18 VITALS — BP 124/76 | HR 61 | Temp 98.1°F | Ht 61.5 in | Wt 160.2 lb

## 2022-09-18 DIAGNOSIS — G4733 Obstructive sleep apnea (adult) (pediatric): Secondary | ICD-10-CM | POA: Diagnosis not present

## 2022-09-18 DIAGNOSIS — J449 Chronic obstructive pulmonary disease, unspecified: Secondary | ICD-10-CM

## 2022-09-18 NOTE — Progress Notes (Signed)
Name: Janet Osborne MRN: [id number] DOB: 22-Jul-1951     CONSULTATION DATE: 09/18/2022 REFERRING MD : Dr. Saunders Revel    CXR 2 View  The CXR was Independently Reviewed By Me  CXR reviewed-WNL, no pneumonia or fluids   CT chest 07/2019 Independently Reviewed By Me Today CT chest-WNL, no masses, very small lung nodule Need to follow up every year  CT chest 8/21 Mild centrilobular emphysema. No pleural effusion, airspace consolidation or atelectasis. Scarring is identified within the right middle lobe and lingula. There is a tiny nodule within the central right lower lobe with an equivalent diameter of 2.8 mm. Unchanged when compared with the previous exam. No new lung nodules.  PFT  09/2019 Moderate restrictive lung disease  MILD FORM OF COPD  Sleep Study 07/2019 AHI 19  CHIEF COMPLAINT:  Follow-up OSA Follow-up COPD    HPI Chronic shortness of breath-stable Uses Symbicort as needed Albuterol as needed Allergic rhinitis Allergy to cats  No exacerbation at this time No evidence of heart failure at this time No evidence or signs of infection at this time No respiratory distress No fevers, chills, nausea, vomiting, diarrhea No evidence of lower extremity edema No evidence hemoptysis  Patient has decided not to continue with CPAP therapy at this time The risks explained to patient in detail regarding noncompliant with CPAP therapy High risk for arrhythmias high risk for heart failure   PAST MEDICAL HISTORY :   has a past medical history of Abdominal hernia, Anemia, Anxiety, Chronic constipation, Colon polyps, Complication of anesthesia, COPD (chronic obstructive pulmonary disease) ([facility]), DDD (degenerative disc disease), cervical, Depression, Endometriosis, Essential hypertension, benign, Fibrocystic breast disease, GERD (gastroesophageal reflux disease), H/O hand surgery (03/17/2017), Hemorrhoids, Hyperlipidemia, Hypothyroidism, Migraine, Nontoxic multinodular  goiter, Osteoarthrosis involving, or with mention of more than one site, but not specified as generalized, multiple sites, and Tobacco use.  has a past surgical history that includes Abdominal hysterectomy; Hernia repair; Rotator cuff repair (Left); Carpal tunnel release (Bilateral); Trigger finger release (Left); thumb surgery; Polypectomy; Appendectomy; Tonsillectomy; Esophagogastroduodenoscopy; Colonoscopy; Anterior cervical decomp/discectomy fusion; Esophagogastroduodenoscopy (egd) with propofol (N/A, 04/03/2017); Cheilectomy (Right, 05/27/2019); Foot surgery; Joint replacement (Left, 01/21/2012); Colonoscopy with propofol (N/A, 07/30/2020); Hallux fusion (Right, 2/72/5366); and Graft application (Right, 4/40/3474). Prior to Admission medications   Medication Sig Start Date End Date Taking? Authorizing Provider  acetaminophen (TYLENOL) 500 MG tablet Take 500 mg by mouth 2 (two) times a day.    [provider]  albuterol (VENTOLIN HFA) 108 (90 Base) MCG/ACT inhaler Inhale 1-2 puffs into the lungs every 6 (six) hours as needed for wheezing or shortness of breath.    [provider]  aspirin 81 MG chewable tablet Chew 81 mg by mouth daily.    [provider]  budesonide-formoterol (SYMBICORT) 160-4.5 MCG/ACT inhaler Inhale 2 puffs into the lungs 2 (two) times daily.    [provider]  Calcium Carbonate-Vit D-Min (CALTRATE 600+D PLUS MINERALS) 600-800 MG-UNIT CHEW Chew 1 tablet by mouth daily.    [provider]  Cholecalciferol (VITAMIN D) 50 MCG (2000 UT) CAPS Take 2,000 Units by mouth at bedtime.    [provider]  Cyanocobalamin (VITAMIN B-12) 5000 MCG TBDP Take 5,000 mcg by mouth at bedtime.    [provider]  diazepam (VALIUM) 5 MG tablet Take 5 mg by mouth every 6 (six) hours as needed for anxiety.    [provider]  esomeprazole (NEXIUM) 40 MG capsule Take 40 mg by mouth daily at 12 noon.  [provider]   estradiol (ESTRACE) 0.5 MG tablet Take 0.5 mg by mouth at bedtime. 04/16/19   [provider]  gabapentin (NEURONTIN) 300 MG capsule Take 300-600 mg by mouth See admin instructions. Take '300mg'$  in the AM, and '600mg'$  at bedtime. 12/26/13   [provider]  hydrochlorothiazide (HYDRODIURIL) 25 MG tablet Take 25 mg by mouth daily.  02/27/15 07/01/19  [provider]  levothyroxine (SYNTHROID) 100 MCG tablet Take 100 mcg by mouth daily before breakfast. 04/16/19   [provider]  loratadine (CLARITIN REDITABS) 10 MG dissolvable tablet Take 10 mg by mouth daily as needed for allergies.    [provider]  losartan (COZAAR) 50 MG tablet Take 50 mg by mouth daily.    [provider]  montelukast (SINGULAIR) 10 MG tablet Take 10 mg by mouth at bedtime. 04/08/19   [provider]  Multiple Vitamins-Minerals (EYE VITAMINS PO) Take 1 tablet by mouth daily.     [provider]  oxyCODONE (ROXICODONE) 5 MG immediate release tablet Take 1 tablet (5 mg total) by mouth every 4 (four) hours as needed. 05/27/19 05/26/20  Sharlotte Alamo, DPM  pravastatin (PRAVACHOL) 40 MG tablet Take 40 mg by mouth every evening.     [provider]  Propylene Glycol (SYSTANE COMPLETE OP) Place 1 drop into both eyes 2 (two) times daily as needed (dry eyes).    [provider]  sodium chloride (MURO 128) 5 % ophthalmic solution Place 1 drop into the left eye 2 (two) times a day.    [provider]  traMADol (ULTRAM) 50 MG tablet Take 50 mg by mouth every 6 (six) hours as needed for moderate pain.     [provider]   Allergies  Allergen Reactions   Macrodantin [Nitrofurantoin Macrocrystal] Hives   Penicillins Other (See Comments)    Did it involve swelling of the face/tongue/throat, SOB, or low BP? Unknown Did it involve sudden or severe rash/hives, skin peeling, or any reaction on the inside of your mouth or nose? Unknown Did you need to  seek medical attention at a hospital or doctor's office? Unknown When did it last happen?  as a child  If all above answers are "NO", may proceed with cephalosporin use.        Review of Systems: Gen:  Denies  fever, sweats, chills weight loss  HEENT: Denies blurred vision, double vision, ear pain, eye pain, hearing loss, nose bleeds, sore throat Cardiac:  No dizziness, chest pain or heaviness, chest tightness,edema, No JVD Resp:   No cough, -sputum production, +shortness of breath,-wheezing, -hemoptysis,  Other:  All other systems negative  BP 124/76 (BP Location: Left Arm, Cuff Size: Normal)   Pulse 61   Temp 98.1 F (36.7 C) (Temporal)   Ht 5' 1.5" (1.562 m)   Wt 160 lb 3.2 oz (72.7 kg)   SpO2 96%   BMI 29.78 kg/m     Physical Examination:   General Appearance: No distress  EYES PERRLA, EOM intact.   NECK Supple, No JVD Pulmonary: normal breath sounds, No wheezing.  CardiovascularNormal S1,S2.  No m/r/g.   Abdomen: Benign, Soft, non-tender. ALL OTHER ROS ARE NEGATIVE     ASSESSMENT AND PLAN SYNOPSIS  71 year old female with COPD mild with interstitial restrictive lung disease with a previous history of sleep apnea very noncompliant with a recent diagnosis of bronchitis and COVID-19 infection  COPD-stable at this time Uses Symbicort as needed Avoid secondhand smoke Avoid allergens Avoid  sick contacts   Moderate to severe OSA Patient has very poor compliance Patient has decided not to use her CPAP anymore  Former smoker 1 pack a day for 50 years Follow-up CT chest for lung cancer screening program CT CHEST 01/2022 New tiny peripheral left lower lobe pulmonary nodule measuring 1.4 mm in volume derived mean diameter (series 3/image 207). No additional new significant pulmonary nodules.   MEDICATION ADJUSTMENTS/LABS AND TESTS ORDERED: Recommend weight loss Continue Lung Cancer Screening Program Avoid secondhand smoke Avoid allergens Avoid sick  contacts  CURRENT MEDICATIONS REVIEWED AT LENGTH WITH PATIENT TODAY   Patient  satisfied with Plan of action and management. All questions answered  Follow-up 1 year  Total Time Spent 25 mins   Laurine Kuyper Patricia Pesa, M.D.  [facility]  Medical Director [place] Director [facility] Cardio-Pulmonary Department

## 2022-09-18 NOTE — Patient Instructions (Addendum)
Continue inhalers as needed Recommend weight loss Continue Lung Cancer Screening Program

## 2022-09-22 ENCOUNTER — Other Ambulatory Visit: Payer: Self-pay | Admitting: Medical

## 2022-10-17 DIAGNOSIS — D225 Melanocytic nevi of trunk: Secondary | ICD-10-CM | POA: Diagnosis not present

## 2022-10-17 DIAGNOSIS — D2261 Melanocytic nevi of right upper limb, including shoulder: Secondary | ICD-10-CM | POA: Diagnosis not present

## 2022-10-17 DIAGNOSIS — L821 Other seborrheic keratosis: Secondary | ICD-10-CM | POA: Diagnosis not present

## 2022-10-17 DIAGNOSIS — D2272 Melanocytic nevi of left lower limb, including hip: Secondary | ICD-10-CM | POA: Diagnosis not present

## 2022-10-17 DIAGNOSIS — D2271 Melanocytic nevi of right lower limb, including hip: Secondary | ICD-10-CM | POA: Diagnosis not present

## 2022-10-17 DIAGNOSIS — R202 Paresthesia of skin: Secondary | ICD-10-CM | POA: Diagnosis not present

## 2022-10-17 DIAGNOSIS — D2262 Melanocytic nevi of left upper limb, including shoulder: Secondary | ICD-10-CM | POA: Diagnosis not present

## 2022-11-05 DIAGNOSIS — H2513 Age-related nuclear cataract, bilateral: Secondary | ICD-10-CM | POA: Diagnosis not present

## 2022-11-05 DIAGNOSIS — H04123 Dry eye syndrome of bilateral lacrimal glands: Secondary | ICD-10-CM | POA: Diagnosis not present

## 2022-11-26 DIAGNOSIS — K219 Gastro-esophageal reflux disease without esophagitis: Secondary | ICD-10-CM | POA: Diagnosis not present

## 2022-12-17 DIAGNOSIS — Z23 Encounter for immunization: Secondary | ICD-10-CM | POA: Diagnosis not present

## 2022-12-17 DIAGNOSIS — W540XXA Bitten by dog, initial encounter: Secondary | ICD-10-CM | POA: Diagnosis not present

## 2022-12-17 DIAGNOSIS — S0185XA Open bite of other part of head, initial encounter: Secondary | ICD-10-CM | POA: Diagnosis not present

## 2022-12-17 DIAGNOSIS — M7989 Other specified soft tissue disorders: Secondary | ICD-10-CM | POA: Diagnosis not present

## 2022-12-17 DIAGNOSIS — S61452A Open bite of left hand, initial encounter: Secondary | ICD-10-CM | POA: Diagnosis not present

## 2022-12-22 ENCOUNTER — Other Ambulatory Visit: Payer: Self-pay | Admitting: Medical

## 2022-12-31 DIAGNOSIS — I10 Essential (primary) hypertension: Secondary | ICD-10-CM | POA: Diagnosis not present

## 2022-12-31 DIAGNOSIS — Z1231 Encounter for screening mammogram for malignant neoplasm of breast: Secondary | ICD-10-CM | POA: Diagnosis not present

## 2022-12-31 DIAGNOSIS — E079 Disorder of thyroid, unspecified: Secondary | ICD-10-CM | POA: Diagnosis not present

## 2022-12-31 DIAGNOSIS — R739 Hyperglycemia, unspecified: Secondary | ICD-10-CM | POA: Diagnosis not present

## 2022-12-31 DIAGNOSIS — E785 Hyperlipidemia, unspecified: Secondary | ICD-10-CM | POA: Diagnosis not present

## 2022-12-31 DIAGNOSIS — F419 Anxiety disorder, unspecified: Secondary | ICD-10-CM | POA: Diagnosis not present

## 2022-12-31 DIAGNOSIS — J449 Chronic obstructive pulmonary disease, unspecified: Secondary | ICD-10-CM | POA: Diagnosis not present

## 2022-12-31 DIAGNOSIS — Z0000 Encounter for general adult medical examination without abnormal findings: Secondary | ICD-10-CM | POA: Diagnosis not present

## 2022-12-31 DIAGNOSIS — Z79899 Other long term (current) drug therapy: Secondary | ICD-10-CM | POA: Diagnosis not present

## 2023-01-07 DIAGNOSIS — M25521 Pain in right elbow: Secondary | ICD-10-CM | POA: Diagnosis not present

## 2023-01-07 DIAGNOSIS — M19011 Primary osteoarthritis, right shoulder: Secondary | ICD-10-CM | POA: Diagnosis not present

## 2023-01-07 DIAGNOSIS — M19021 Primary osteoarthritis, right elbow: Secondary | ICD-10-CM | POA: Diagnosis not present

## 2023-01-07 DIAGNOSIS — M25421 Effusion, right elbow: Secondary | ICD-10-CM | POA: Diagnosis not present

## 2023-01-07 DIAGNOSIS — G8929 Other chronic pain: Secondary | ICD-10-CM | POA: Diagnosis not present

## 2023-01-07 DIAGNOSIS — M7711 Lateral epicondylitis, right elbow: Secondary | ICD-10-CM | POA: Diagnosis not present

## 2023-01-07 DIAGNOSIS — M8588 Other specified disorders of bone density and structure, other site: Secondary | ICD-10-CM | POA: Diagnosis not present

## 2023-01-07 DIAGNOSIS — M25511 Pain in right shoulder: Secondary | ICD-10-CM | POA: Diagnosis not present

## 2023-01-21 ENCOUNTER — Ambulatory Visit
Admission: RE | Admit: 2023-01-21 | Discharge: 2023-01-21 | Disposition: A | Discharge status: Home / Self Care | Payer: Medicare HMO | Source: Ambulatory Visit | Attending: Internal Medicine | Admitting: Internal Medicine

## 2023-01-21 DIAGNOSIS — Z1231 Encounter for screening mammogram for malignant neoplasm of breast: Secondary | ICD-10-CM | POA: Insufficient documentation

## 2023-01-26 ENCOUNTER — Other Ambulatory Visit: Payer: Self-pay | Admitting: Internal Medicine

## 2023-01-26 DIAGNOSIS — R928 Other abnormal and inconclusive findings on diagnostic imaging of breast: Secondary | ICD-10-CM

## 2023-01-30 ENCOUNTER — Ambulatory Visit
Admission: RE | Admit: 2023-01-30 | Discharge: 2023-01-30 | Disposition: A | Discharge status: Home / Self Care | Payer: Medicare HMO | Source: Ambulatory Visit | Attending: Acute Care | Admitting: Acute Care

## 2023-01-30 DIAGNOSIS — Z87891 Personal history of nicotine dependence: Secondary | ICD-10-CM | POA: Diagnosis not present

## 2023-02-02 ENCOUNTER — Ambulatory Visit
Admission: RE | Admit: 2023-02-02 | Discharge: 2023-02-02 | Disposition: A | Discharge status: Home / Self Care | Payer: Medicare HMO | Source: Ambulatory Visit | Attending: Internal Medicine | Admitting: Internal Medicine

## 2023-02-02 ENCOUNTER — Telehealth: Payer: Self-pay | Admitting: Acute Care

## 2023-02-02 DIAGNOSIS — R928 Other abnormal and inconclusive findings on diagnostic imaging of breast: Secondary | ICD-10-CM | POA: Diagnosis not present

## 2023-02-02 DIAGNOSIS — R922 Inconclusive mammogram: Secondary | ICD-10-CM | POA: Diagnosis not present

## 2023-02-02 NOTE — Telephone Encounter (Signed)
Called report from [facility] Radiology  IMPRESSION: 1. New pulmonary nodule in the medial aspect of the right lower lobe categorized as Lung-RADS 4AS, suspicious. Follow up low-dose chest CT without contrast in 3 months (please use the following order, "CT CHEST LCS NODULE FOLLOW-UP W/O CM") is recommended. PET-CT is not strongly recommended at this time, as this could represent infectious/inflammatory nodule. 2. The "S" modifier above refers to potentially clinically significant non lung cancer related findings. Specifically, there is aortic atherosclerosis, in addition to two-vessel coronary artery disease. Please note that although the presence of coronary artery calcium documents the presence of coronary artery disease, the severity of this disease and any potential stenosis cannot be assessed on this non-gated CT examination. Assessment for potential risk factor modification, dietary therapy or pharmacologic therapy may be warranted, if clinically indicated. 3. Mild diffuse bronchial wall thickening with mild centrilobular and paraseptal emphysema; imaging findings suggestive of underlying COPD.

## 2023-02-02 NOTE — Telephone Encounter (Signed)
Janet Osborne calling with call report. For CT scan. Janet Osborne phone number is [phone].

## 2023-02-02 NOTE — Telephone Encounter (Signed)
Noted! Thank you

## 2023-02-03 ENCOUNTER — Telehealth: Payer: Self-pay | Admitting: Acute Care

## 2023-02-03 DIAGNOSIS — Z87891 Personal history of nicotine dependence: Secondary | ICD-10-CM

## 2023-02-03 DIAGNOSIS — R911 Solitary pulmonary nodule: Secondary | ICD-10-CM

## 2023-02-03 NOTE — Telephone Encounter (Signed)
Order placed for 3 months LDCT nodule follow up and results/plan faxed to PCP

## 2023-02-03 NOTE — Telephone Encounter (Signed)
I have called Janet Osborne with the results of her low dose CT Chest. Her scan was read as a 4A. She has a new  new lesion in the posteromedial aspect of the right lower lobe (axial image 198) with a volume derived mean diameter of 7.5 mm. Recommendation is for a 3 month follow up. Patient is in agreement. Incidental findings are CAD, and aortic stenosis and emphysema. She is on statin medication . [name] , 3 month follow up due end May 2024. Fax  results to PCP and she has also requested [name], who I will add to this message.  Thanks so much

## 2023-04-08 DIAGNOSIS — R739 Hyperglycemia, unspecified: Secondary | ICD-10-CM | POA: Diagnosis not present

## 2023-04-08 DIAGNOSIS — F419 Anxiety disorder, unspecified: Secondary | ICD-10-CM | POA: Diagnosis not present

## 2023-04-08 DIAGNOSIS — I10 Essential (primary) hypertension: Secondary | ICD-10-CM | POA: Diagnosis not present

## 2023-04-08 DIAGNOSIS — E782 Mixed hyperlipidemia: Secondary | ICD-10-CM | POA: Diagnosis not present

## 2023-04-08 DIAGNOSIS — Z79899 Other long term (current) drug therapy: Secondary | ICD-10-CM | POA: Diagnosis not present

## 2023-04-08 DIAGNOSIS — E039 Hypothyroidism, unspecified: Secondary | ICD-10-CM | POA: Diagnosis not present

## 2023-04-17 ENCOUNTER — Other Ambulatory Visit: Payer: Self-pay | Admitting: Medical

## 2023-04-30 ENCOUNTER — Ambulatory Visit
Admission: RE | Admit: 2023-04-30 | Discharge: 2023-04-30 | Disposition: A | Discharge status: Home / Self Care | Payer: Medicare HMO | Source: Ambulatory Visit | Attending: Acute Care | Admitting: Acute Care

## 2023-04-30 ENCOUNTER — Ambulatory Visit: Payer: Medicare HMO

## 2023-04-30 DIAGNOSIS — Z87891 Personal history of nicotine dependence: Secondary | ICD-10-CM | POA: Insufficient documentation

## 2023-04-30 DIAGNOSIS — R911 Solitary pulmonary nodule: Secondary | ICD-10-CM | POA: Diagnosis not present

## 2023-05-07 ENCOUNTER — Other Ambulatory Visit: Payer: Self-pay | Admitting: Acute Care

## 2023-05-07 DIAGNOSIS — Z87891 Personal history of nicotine dependence: Secondary | ICD-10-CM

## 2023-05-07 DIAGNOSIS — Z122 Encounter for screening for malignant neoplasm of respiratory organs: Secondary | ICD-10-CM

## 2023-05-18 DIAGNOSIS — M19021 Primary osteoarthritis, right elbow: Secondary | ICD-10-CM | POA: Diagnosis not present

## 2023-05-18 DIAGNOSIS — M7541 Impingement syndrome of right shoulder: Secondary | ICD-10-CM | POA: Diagnosis not present

## 2023-05-18 DIAGNOSIS — M7521 Bicipital tendinitis, right shoulder: Secondary | ICD-10-CM | POA: Diagnosis not present

## 2023-05-18 DIAGNOSIS — M25421 Effusion, right elbow: Secondary | ICD-10-CM | POA: Diagnosis not present

## 2023-05-18 DIAGNOSIS — M19011 Primary osteoarthritis, right shoulder: Secondary | ICD-10-CM | POA: Diagnosis not present

## 2023-05-18 DIAGNOSIS — G8929 Other chronic pain: Secondary | ICD-10-CM | POA: Diagnosis not present

## 2023-05-19 ENCOUNTER — Other Ambulatory Visit: Payer: Self-pay | Admitting: Medical

## 2023-05-29 ENCOUNTER — Other Ambulatory Visit: Payer: Self-pay | Admitting: Internal Medicine

## 2023-05-29 DIAGNOSIS — M4807 Spinal stenosis, lumbosacral region: Secondary | ICD-10-CM

## 2023-06-03 ENCOUNTER — Ambulatory Visit
Admission: RE | Admit: 2023-06-03 | Discharge: 2023-06-03 | Disposition: A | Discharge status: Home / Self Care | Payer: Medicare HMO | Source: Ambulatory Visit | Attending: Internal Medicine | Admitting: Internal Medicine

## 2023-06-03 DIAGNOSIS — M5136 Other intervertebral disc degeneration, lumbar region: Secondary | ICD-10-CM | POA: Diagnosis not present

## 2023-06-03 DIAGNOSIS — M5135 Other intervertebral disc degeneration, thoracolumbar region: Secondary | ICD-10-CM | POA: Diagnosis not present

## 2023-06-03 DIAGNOSIS — M4317 Spondylolisthesis, lumbosacral region: Secondary | ICD-10-CM | POA: Diagnosis not present

## 2023-06-03 DIAGNOSIS — M47816 Spondylosis without myelopathy or radiculopathy, lumbar region: Secondary | ICD-10-CM | POA: Diagnosis not present

## 2023-06-03 DIAGNOSIS — M4807 Spinal stenosis, lumbosacral region: Secondary | ICD-10-CM | POA: Insufficient documentation

## 2023-06-17 DIAGNOSIS — M47816 Spondylosis without myelopathy or radiculopathy, lumbar region: Secondary | ICD-10-CM | POA: Diagnosis not present

## 2023-06-17 DIAGNOSIS — M1612 Unilateral primary osteoarthritis, left hip: Secondary | ICD-10-CM | POA: Diagnosis not present

## 2023-06-17 DIAGNOSIS — M4317 Spondylolisthesis, lumbosacral region: Secondary | ICD-10-CM | POA: Diagnosis not present

## 2023-06-17 DIAGNOSIS — M5126 Other intervertebral disc displacement, lumbar region: Secondary | ICD-10-CM | POA: Diagnosis not present

## 2023-06-17 DIAGNOSIS — M5137 Other intervertebral disc degeneration, lumbosacral region: Secondary | ICD-10-CM | POA: Diagnosis not present

## 2023-06-17 DIAGNOSIS — M5416 Radiculopathy, lumbar region: Secondary | ICD-10-CM | POA: Diagnosis not present

## 2023-06-17 DIAGNOSIS — Z981 Arthrodesis status: Secondary | ICD-10-CM | POA: Diagnosis not present

## 2023-06-17 DIAGNOSIS — M4310 Spondylolisthesis, site unspecified: Secondary | ICD-10-CM | POA: Diagnosis not present

## 2023-06-17 DIAGNOSIS — M5136 Other intervertebral disc degeneration, lumbar region: Secondary | ICD-10-CM | POA: Diagnosis not present

## 2023-06-17 DIAGNOSIS — J449 Chronic obstructive pulmonary disease, unspecified: Secondary | ICD-10-CM | POA: Diagnosis not present

## 2023-07-08 DIAGNOSIS — I10 Essential (primary) hypertension: Secondary | ICD-10-CM | POA: Diagnosis not present

## 2023-07-08 DIAGNOSIS — E782 Mixed hyperlipidemia: Secondary | ICD-10-CM | POA: Diagnosis not present

## 2023-07-08 DIAGNOSIS — R739 Hyperglycemia, unspecified: Secondary | ICD-10-CM | POA: Diagnosis not present

## 2023-07-08 DIAGNOSIS — E039 Hypothyroidism, unspecified: Secondary | ICD-10-CM | POA: Diagnosis not present

## 2023-07-08 DIAGNOSIS — Z79899 Other long term (current) drug therapy: Secondary | ICD-10-CM | POA: Diagnosis not present

## 2023-08-12 DIAGNOSIS — F419 Anxiety disorder, unspecified: Secondary | ICD-10-CM | POA: Diagnosis not present

## 2023-08-12 DIAGNOSIS — E039 Hypothyroidism, unspecified: Secondary | ICD-10-CM | POA: Diagnosis not present

## 2023-08-12 DIAGNOSIS — E782 Mixed hyperlipidemia: Secondary | ICD-10-CM | POA: Diagnosis not present

## 2023-08-12 DIAGNOSIS — R739 Hyperglycemia, unspecified: Secondary | ICD-10-CM | POA: Diagnosis not present

## 2023-08-12 DIAGNOSIS — I10 Essential (primary) hypertension: Secondary | ICD-10-CM | POA: Diagnosis not present

## 2023-09-30 ENCOUNTER — Ambulatory Visit: Payer: Medicare HMO | Admitting: Adult Health

## 2023-10-12 ENCOUNTER — Ambulatory Visit: Payer: Self-pay | Admitting: Urology

## 2023-10-21 DIAGNOSIS — M9953 Intervertebral disc stenosis of neural canal of lumbar region: Secondary | ICD-10-CM | POA: Diagnosis not present

## 2023-10-21 DIAGNOSIS — M76891 Other specified enthesopathies of right lower limb, excluding foot: Secondary | ICD-10-CM | POA: Diagnosis not present

## 2023-10-21 DIAGNOSIS — M25552 Pain in left hip: Secondary | ICD-10-CM | POA: Diagnosis not present

## 2023-10-29 DIAGNOSIS — Z981 Arthrodesis status: Secondary | ICD-10-CM | POA: Diagnosis not present

## 2023-10-29 DIAGNOSIS — M4803 Spinal stenosis, cervicothoracic region: Secondary | ICD-10-CM | POA: Diagnosis not present

## 2023-10-29 DIAGNOSIS — M5126 Other intervertebral disc displacement, lumbar region: Secondary | ICD-10-CM | POA: Diagnosis not present

## 2023-10-29 DIAGNOSIS — M542 Cervicalgia: Secondary | ICD-10-CM | POA: Diagnosis not present

## 2023-10-29 DIAGNOSIS — M1612 Unilateral primary osteoarthritis, left hip: Secondary | ICD-10-CM | POA: Diagnosis not present

## 2023-10-29 DIAGNOSIS — M4312 Spondylolisthesis, cervical region: Secondary | ICD-10-CM | POA: Diagnosis not present

## 2023-10-30 DIAGNOSIS — D2271 Melanocytic nevi of right lower limb, including hip: Secondary | ICD-10-CM | POA: Diagnosis not present

## 2023-10-30 DIAGNOSIS — D225 Melanocytic nevi of trunk: Secondary | ICD-10-CM | POA: Diagnosis not present

## 2023-10-30 DIAGNOSIS — D2261 Melanocytic nevi of right upper limb, including shoulder: Secondary | ICD-10-CM | POA: Diagnosis not present

## 2023-10-30 DIAGNOSIS — L821 Other seborrheic keratosis: Secondary | ICD-10-CM | POA: Diagnosis not present

## 2023-10-30 DIAGNOSIS — D2262 Melanocytic nevi of left upper limb, including shoulder: Secondary | ICD-10-CM | POA: Diagnosis not present

## 2023-10-30 DIAGNOSIS — B078 Other viral warts: Secondary | ICD-10-CM | POA: Diagnosis not present

## 2023-10-30 DIAGNOSIS — D2272 Melanocytic nevi of left lower limb, including hip: Secondary | ICD-10-CM | POA: Diagnosis not present

## 2023-11-03 ENCOUNTER — Ambulatory Visit: Payer: Medicare HMO | Admitting: Nurse Practitioner

## 2023-11-12 DIAGNOSIS — H2513 Age-related nuclear cataract, bilateral: Secondary | ICD-10-CM | POA: Diagnosis not present

## 2023-11-12 DIAGNOSIS — Z83518 Family history of other specified eye disorder: Secondary | ICD-10-CM | POA: Diagnosis not present

## 2023-11-12 DIAGNOSIS — H0289 Other specified disorders of eyelid: Secondary | ICD-10-CM | POA: Diagnosis not present

## 2023-12-08 DIAGNOSIS — M1612 Unilateral primary osteoarthritis, left hip: Secondary | ICD-10-CM | POA: Diagnosis not present

## 2023-12-10 ENCOUNTER — Ambulatory Visit: Payer: Medicare HMO | Admitting: Nurse Practitioner

## 2023-12-17 DIAGNOSIS — M545 Low back pain, unspecified: Secondary | ICD-10-CM | POA: Diagnosis not present

## 2023-12-18 DIAGNOSIS — M8588 Other specified disorders of bone density and structure, other site: Secondary | ICD-10-CM | POA: Diagnosis not present

## 2023-12-18 DIAGNOSIS — M4319 Spondylolisthesis, multiple sites in spine: Secondary | ICD-10-CM | POA: Diagnosis not present

## 2023-12-18 DIAGNOSIS — F32A Depression, unspecified: Secondary | ICD-10-CM | POA: Diagnosis not present

## 2023-12-18 DIAGNOSIS — Z981 Arthrodesis status: Secondary | ICD-10-CM | POA: Diagnosis not present

## 2023-12-18 DIAGNOSIS — Z90710 Acquired absence of both cervix and uterus: Secondary | ICD-10-CM | POA: Diagnosis not present

## 2023-12-18 DIAGNOSIS — M1990 Unspecified osteoarthritis, unspecified site: Secondary | ICD-10-CM | POA: Diagnosis not present

## 2023-12-18 DIAGNOSIS — Z79899 Other long term (current) drug therapy: Secondary | ICD-10-CM | POA: Diagnosis not present

## 2023-12-18 DIAGNOSIS — R8290 Unspecified abnormal findings in urine: Secondary | ICD-10-CM | POA: Diagnosis not present

## 2023-12-18 DIAGNOSIS — M25552 Pain in left hip: Secondary | ICD-10-CM | POA: Diagnosis not present

## 2023-12-18 DIAGNOSIS — E039 Hypothyroidism, unspecified: Secondary | ICD-10-CM | POA: Diagnosis not present

## 2023-12-18 DIAGNOSIS — Z7951 Long term (current) use of inhaled steroids: Secondary | ICD-10-CM | POA: Diagnosis not present

## 2023-12-18 DIAGNOSIS — M4317 Spondylolisthesis, lumbosacral region: Secondary | ICD-10-CM | POA: Diagnosis not present

## 2023-12-18 DIAGNOSIS — M5126 Other intervertebral disc displacement, lumbar region: Secondary | ICD-10-CM | POA: Diagnosis not present

## 2023-12-18 DIAGNOSIS — M62838 Other muscle spasm: Secondary | ICD-10-CM | POA: Diagnosis not present

## 2023-12-18 DIAGNOSIS — J449 Chronic obstructive pulmonary disease, unspecified: Secondary | ICD-10-CM | POA: Diagnosis not present

## 2023-12-18 DIAGNOSIS — K219 Gastro-esophageal reflux disease without esophagitis: Secondary | ICD-10-CM | POA: Diagnosis not present

## 2023-12-18 DIAGNOSIS — I10 Essential (primary) hypertension: Secondary | ICD-10-CM | POA: Diagnosis not present

## 2023-12-18 DIAGNOSIS — M51369 Other intervertebral disc degeneration, lumbar region without mention of lumbar back pain or lower extremity pain: Secondary | ICD-10-CM | POA: Diagnosis not present

## 2023-12-18 DIAGNOSIS — M47816 Spondylosis without myelopathy or radiculopathy, lumbar region: Secondary | ICD-10-CM | POA: Diagnosis not present

## 2023-12-18 DIAGNOSIS — Z9181 History of falling: Secondary | ICD-10-CM | POA: Diagnosis not present

## 2023-12-18 DIAGNOSIS — M48062 Spinal stenosis, lumbar region with neurogenic claudication: Secondary | ICD-10-CM | POA: Diagnosis not present

## 2023-12-18 DIAGNOSIS — Z880 Allergy status to penicillin: Secondary | ICD-10-CM | POA: Diagnosis not present

## 2023-12-18 DIAGNOSIS — Z7982 Long term (current) use of aspirin: Secondary | ICD-10-CM | POA: Diagnosis not present

## 2023-12-18 DIAGNOSIS — Z736 Limitation of activities due to disability: Secondary | ICD-10-CM | POA: Diagnosis not present

## 2023-12-18 DIAGNOSIS — E785 Hyperlipidemia, unspecified: Secondary | ICD-10-CM | POA: Diagnosis not present

## 2023-12-18 DIAGNOSIS — Z79890 Hormone replacement therapy (postmenopausal): Secondary | ICD-10-CM | POA: Diagnosis not present

## 2023-12-22 ENCOUNTER — Other Ambulatory Visit: Payer: Self-pay | Admitting: Internal Medicine

## 2023-12-22 DIAGNOSIS — R739 Hyperglycemia, unspecified: Secondary | ICD-10-CM | POA: Diagnosis not present

## 2023-12-22 DIAGNOSIS — J431 Panlobular emphysema: Secondary | ICD-10-CM | POA: Diagnosis not present

## 2023-12-22 DIAGNOSIS — F32A Depression, unspecified: Secondary | ICD-10-CM | POA: Diagnosis not present

## 2023-12-22 DIAGNOSIS — I10 Essential (primary) hypertension: Secondary | ICD-10-CM | POA: Diagnosis not present

## 2023-12-22 DIAGNOSIS — R7303 Prediabetes: Secondary | ICD-10-CM | POA: Diagnosis not present

## 2023-12-22 DIAGNOSIS — E039 Hypothyroidism, unspecified: Secondary | ICD-10-CM | POA: Diagnosis not present

## 2023-12-22 DIAGNOSIS — Z0000 Encounter for general adult medical examination without abnormal findings: Secondary | ICD-10-CM | POA: Diagnosis not present

## 2023-12-22 DIAGNOSIS — Z79899 Other long term (current) drug therapy: Secondary | ICD-10-CM | POA: Diagnosis not present

## 2023-12-22 DIAGNOSIS — E782 Mixed hyperlipidemia: Secondary | ICD-10-CM | POA: Diagnosis not present

## 2023-12-22 DIAGNOSIS — M8580 Other specified disorders of bone density and structure, unspecified site: Secondary | ICD-10-CM | POA: Diagnosis not present

## 2023-12-22 DIAGNOSIS — F419 Anxiety disorder, unspecified: Secondary | ICD-10-CM | POA: Diagnosis not present

## 2023-12-22 DIAGNOSIS — Z1231 Encounter for screening mammogram for malignant neoplasm of breast: Secondary | ICD-10-CM

## 2024-01-18 DIAGNOSIS — M4317 Spondylolisthesis, lumbosacral region: Secondary | ICD-10-CM | POA: Diagnosis not present

## 2024-01-18 DIAGNOSIS — M4316 Spondylolisthesis, lumbar region: Secondary | ICD-10-CM | POA: Diagnosis not present

## 2024-01-18 DIAGNOSIS — M5126 Other intervertebral disc displacement, lumbar region: Secondary | ICD-10-CM | POA: Diagnosis not present

## 2024-01-18 DIAGNOSIS — M47816 Spondylosis without myelopathy or radiculopathy, lumbar region: Secondary | ICD-10-CM | POA: Diagnosis not present

## 2024-01-18 DIAGNOSIS — M48062 Spinal stenosis, lumbar region with neurogenic claudication: Secondary | ICD-10-CM | POA: Diagnosis not present

## 2024-01-18 DIAGNOSIS — M51369 Other intervertebral disc degeneration, lumbar region without mention of lumbar back pain or lower extremity pain: Secondary | ICD-10-CM | POA: Diagnosis not present

## 2024-01-22 DIAGNOSIS — M48062 Spinal stenosis, lumbar region with neurogenic claudication: Secondary | ICD-10-CM | POA: Diagnosis not present

## 2024-01-27 DIAGNOSIS — M19021 Primary osteoarthritis, right elbow: Secondary | ICD-10-CM | POA: Diagnosis not present

## 2024-01-27 DIAGNOSIS — G8929 Other chronic pain: Secondary | ICD-10-CM | POA: Diagnosis not present

## 2024-01-27 DIAGNOSIS — M25421 Effusion, right elbow: Secondary | ICD-10-CM | POA: Diagnosis not present

## 2024-01-27 DIAGNOSIS — M7711 Lateral epicondylitis, right elbow: Secondary | ICD-10-CM | POA: Diagnosis not present

## 2024-02-04 ENCOUNTER — Ambulatory Visit
Admission: RE | Admit: 2024-02-04 | Discharge: 2024-02-04 | Disposition: A | Discharge status: Home / Self Care | Payer: PPO | Source: Ambulatory Visit | Attending: Internal Medicine | Admitting: Internal Medicine

## 2024-02-04 DIAGNOSIS — Z1231 Encounter for screening mammogram for malignant neoplasm of breast: Secondary | ICD-10-CM | POA: Diagnosis not present

## 2024-04-12 DIAGNOSIS — M1612 Unilateral primary osteoarthritis, left hip: Secondary | ICD-10-CM | POA: Diagnosis not present

## 2024-04-23 ENCOUNTER — Emergency Department

## 2024-04-23 ENCOUNTER — Other Ambulatory Visit: Payer: Self-pay

## 2024-04-23 ENCOUNTER — Emergency Department
Admission: EM | Admit: 2024-04-23 | Discharge: 2024-04-23 | Disposition: A | Discharge status: Home / Self Care | Attending: Emergency Medicine | Admitting: Emergency Medicine

## 2024-04-23 DIAGNOSIS — S199XXA Unspecified injury of neck, initial encounter: Secondary | ICD-10-CM | POA: Diagnosis not present

## 2024-04-23 DIAGNOSIS — S5012XA Contusion of left forearm, initial encounter: Secondary | ICD-10-CM | POA: Insufficient documentation

## 2024-04-23 DIAGNOSIS — I10 Essential (primary) hypertension: Secondary | ICD-10-CM | POA: Diagnosis not present

## 2024-04-23 DIAGNOSIS — S0990XA Unspecified injury of head, initial encounter: Secondary | ICD-10-CM

## 2024-04-23 DIAGNOSIS — M47812 Spondylosis without myelopathy or radiculopathy, cervical region: Secondary | ICD-10-CM | POA: Diagnosis not present

## 2024-04-23 DIAGNOSIS — T07XXXA Unspecified multiple injuries, initial encounter: Secondary | ICD-10-CM

## 2024-04-23 DIAGNOSIS — W01198A Fall on same level from slipping, tripping and stumbling with subsequent striking against other object, initial encounter: Secondary | ICD-10-CM | POA: Diagnosis not present

## 2024-04-23 DIAGNOSIS — S161XXA Strain of muscle, fascia and tendon at neck level, initial encounter: Secondary | ICD-10-CM | POA: Diagnosis not present

## 2024-04-23 DIAGNOSIS — S0003XA Contusion of scalp, initial encounter: Secondary | ICD-10-CM | POA: Diagnosis not present

## 2024-04-23 DIAGNOSIS — M25512 Pain in left shoulder: Secondary | ICD-10-CM | POA: Diagnosis not present

## 2024-04-23 DIAGNOSIS — S5002XA Contusion of left elbow, initial encounter: Secondary | ICD-10-CM | POA: Diagnosis not present

## 2024-04-23 DIAGNOSIS — M47813 Spondylosis without myelopathy or radiculopathy, cervicothoracic region: Secondary | ICD-10-CM | POA: Diagnosis not present

## 2024-04-23 DIAGNOSIS — S46912A Strain of unspecified muscle, fascia and tendon at shoulder and upper arm level, left arm, initial encounter: Secondary | ICD-10-CM | POA: Insufficient documentation

## 2024-04-23 DIAGNOSIS — Z7982 Long term (current) use of aspirin: Secondary | ICD-10-CM | POA: Insufficient documentation

## 2024-04-23 DIAGNOSIS — Z981 Arthrodesis status: Secondary | ICD-10-CM | POA: Diagnosis not present

## 2024-04-23 NOTE — ED Provider Notes (Signed)
 [facility] Provider Note    Event Date/Time   First MD Initiated Contact with Patient 04/23/24 1116     (approximate)   History   Fall   HPI  Tiajuana Leppanen is a 73 y.o. female with history of hypertension, hyperlipidemia, degenerative disc disease, anemia and as listed in EMR presents to the emergency department for treatment and evaluation after tripping and falling while getting up to the bathroom.  She believes that her toe got caught in something causing her to fall. She hit her head on the dresser and immediately had a knot on the back of her head.  No loss of consciousness.  She is also complaining of left shoulder pain and left elbow pain.  She has had several bruises appear on the arm and shoulder since the injury.  She is on daily aspirin but no other anticoagulants.      Physical Exam   Triage Vital Signs: ED Triage Vitals  Encounter Vitals Group     BP 04/23/24 1038 (!) 172/92     Systolic BP Percentile --      Diastolic BP Percentile --      Pulse Rate 04/23/24 1038 (!) 57     Resp 04/23/24 1038 18     Temp 04/23/24 1038 97.7 F (36.5 C)     Temp Source 04/23/24 1038 Oral     SpO2 04/23/24 1038 100 %     Weight --      Height --      Head Circumference --      Peak Flow --      Pain Score 04/23/24 1039 (S) 6     Pain Loc --      Pain Education --      Exclude from Growth Chart --     Most recent vital signs: Vitals:   04/23/24 1038  BP: (!) 172/92  Pulse: (!) 57  Resp: 18  Temp: 97.7 F (36.5 C)  SpO2: 100%    General: Awake, no distress.  CV:  Good peripheral perfusion.  Resp:  Normal effort.  Abd:  No distention.  Other:  Hematoma felt over the left occipital area without open wound.   Contusion noted over the posterior aspect of the left shoulder, elbow, and forearm.  Demonstrates active abduction of the left arm with complaint of pain.  No obvious step-off or deformity noted.  Patient able to demonstrate full  range of motion of the left elbow and wrist.   ED Results / Procedures / Treatments   Labs (all labs ordered are listed, but only abnormal results are displayed) Labs Reviewed - No data to display   EKG  Not indicated   RADIOLOGY  Image and radiology report reviewed and interpreted by me. Radiology report consistent with the same.  CT image of the head and cervical spine are negative for acute concerns.  X-ray image of the left shoulder is negative for acute findings.  PROCEDURES:  Critical Care performed: No  Procedures   MEDICATIONS ORDERED IN ED:  Medications - No data to display   IMPRESSION / MDM / ASSESSMENT AND PLAN / ED COURSE   I have reviewed the triage note.  Differential diagnosis includes, but is not limited to, ICH, scalp hematoma, fracture, muscle strain, contusion  Patient's presentation is most consistent with acute presentation with potential threat to life or bodily function.  73 year old female presenting to the emergency department after mechanical, nonsyncopal fall this morning at  home.  See HPI for further details.  Exam is reassuring.  She does have a history of cervical fusion and is having some neck soreness.  Plan will be to get a CT of her head and cervical spine.  She also has pain with range of motion of the left shoulder although there is no step-off deformity.  X-ray image will be obtained as well.  CT of the head, cervical spine and x-ray image of the left shoulder is without acute findings.  Results discussed with the patient.  She states that she has tramadol and ibuprofen  at home and will take them if needed as prescribed.  She will be discharged home with head injury instructions and advised to follow-up with primary care if not improving over the week.  For any concerns over the weekend, she is to return to the emergency department.      FINAL CLINICAL IMPRESSION(S) / ED DIAGNOSES   Final diagnoses:  Hematoma of scalp,  initial encounter  Minor head injury, initial encounter  Acute strain of neck muscle, initial encounter  Shoulder strain, left, initial encounter  Multiple contusions     Rx / DC Orders   ED Discharge Orders     None        Note:  This document was prepared using Dragon voice recognition software and may include unintentional dictation errors.   Sherryle Don, FNP 04/23/24 1238    Iver Marker, MD 04/23/24 1525

## 2024-04-23 NOTE — ED Triage Notes (Signed)
 Pt states she tripped and fell getting up to go to the bathroom and hit her head on the dresser. Denies any LOC. Pt c/o head pain and left shoulder pain and left elbow pain.

## 2024-04-23 NOTE — ED Notes (Signed)
 Pt d/c home per EDP order. Discharge summary reviewed, pt verbalizes understanding, ambulatory off unit with spouse. NAD

## 2024-05-03 DIAGNOSIS — M7582 Other shoulder lesions, left shoulder: Secondary | ICD-10-CM | POA: Diagnosis not present

## 2024-05-03 DIAGNOSIS — M1711 Unilateral primary osteoarthritis, right knee: Secondary | ICD-10-CM | POA: Diagnosis not present

## 2024-05-04 DIAGNOSIS — Z981 Arthrodesis status: Secondary | ICD-10-CM | POA: Diagnosis not present

## 2024-05-04 DIAGNOSIS — M5126 Other intervertebral disc displacement, lumbar region: Secondary | ICD-10-CM | POA: Diagnosis not present

## 2024-05-04 DIAGNOSIS — M1612 Unilateral primary osteoarthritis, left hip: Secondary | ICD-10-CM | POA: Diagnosis not present

## 2024-05-11 DIAGNOSIS — E782 Mixed hyperlipidemia: Secondary | ICD-10-CM | POA: Diagnosis not present

## 2024-05-11 DIAGNOSIS — J431 Panlobular emphysema: Secondary | ICD-10-CM | POA: Diagnosis not present

## 2024-05-11 DIAGNOSIS — Z0000 Encounter for general adult medical examination without abnormal findings: Secondary | ICD-10-CM | POA: Diagnosis not present

## 2024-05-11 DIAGNOSIS — Z1331 Encounter for screening for depression: Secondary | ICD-10-CM | POA: Diagnosis not present

## 2024-05-11 DIAGNOSIS — F419 Anxiety disorder, unspecified: Secondary | ICD-10-CM | POA: Diagnosis not present

## 2024-05-11 DIAGNOSIS — Z79899 Other long term (current) drug therapy: Secondary | ICD-10-CM | POA: Diagnosis not present

## 2024-05-11 DIAGNOSIS — F32A Depression, unspecified: Secondary | ICD-10-CM | POA: Diagnosis not present

## 2024-05-11 DIAGNOSIS — M25521 Pain in right elbow: Secondary | ICD-10-CM | POA: Diagnosis not present

## 2024-05-11 DIAGNOSIS — I10 Essential (primary) hypertension: Secondary | ICD-10-CM | POA: Diagnosis not present

## 2024-05-11 DIAGNOSIS — R7303 Prediabetes: Secondary | ICD-10-CM | POA: Diagnosis not present

## 2024-05-11 DIAGNOSIS — E039 Hypothyroidism, unspecified: Secondary | ICD-10-CM | POA: Diagnosis not present

## 2024-06-09 DIAGNOSIS — R399 Unspecified symptoms and signs involving the genitourinary system: Secondary | ICD-10-CM | POA: Diagnosis not present

## 2024-06-15 ENCOUNTER — Other Ambulatory Visit: Payer: Self-pay | Admitting: Orthopedic Surgery

## 2024-06-15 DIAGNOSIS — M1711 Unilateral primary osteoarthritis, right knee: Secondary | ICD-10-CM

## 2024-06-15 DIAGNOSIS — M25561 Pain in right knee: Secondary | ICD-10-CM

## 2024-06-16 ENCOUNTER — Ambulatory Visit
Admission: RE | Admit: 2024-06-16 | Discharge: 2024-06-16 | Disposition: A | Discharge status: Home / Self Care | Source: Ambulatory Visit | Attending: Orthopedic Surgery | Admitting: Orthopedic Surgery

## 2024-06-16 DIAGNOSIS — M1711 Unilateral primary osteoarthritis, right knee: Secondary | ICD-10-CM | POA: Insufficient documentation

## 2024-06-16 DIAGNOSIS — M25561 Pain in right knee: Secondary | ICD-10-CM | POA: Insufficient documentation

## 2024-06-16 DIAGNOSIS — M25461 Effusion, right knee: Secondary | ICD-10-CM | POA: Diagnosis not present

## 2024-06-29 DIAGNOSIS — M1711 Unilateral primary osteoarthritis, right knee: Secondary | ICD-10-CM | POA: Diagnosis not present

## 2024-06-29 DIAGNOSIS — M2241 Chondromalacia patellae, right knee: Secondary | ICD-10-CM | POA: Diagnosis not present

## 2024-07-08 DIAGNOSIS — J431 Panlobular emphysema: Secondary | ICD-10-CM | POA: Diagnosis not present

## 2024-07-08 DIAGNOSIS — I10 Essential (primary) hypertension: Secondary | ICD-10-CM | POA: Diagnosis not present

## 2024-07-08 DIAGNOSIS — M7989 Other specified soft tissue disorders: Secondary | ICD-10-CM | POA: Diagnosis not present

## 2024-07-12 ENCOUNTER — Encounter: Payer: Self-pay | Admitting: Medical

## 2024-07-12 ENCOUNTER — Ambulatory Visit: Attending: Medical | Admitting: Medical

## 2024-07-12 VITALS — BP 134/70 | HR 56 | Ht 60.0 in | Wt 163.4 lb

## 2024-07-12 DIAGNOSIS — I10 Essential (primary) hypertension: Secondary | ICD-10-CM

## 2024-07-12 DIAGNOSIS — R0609 Other forms of dyspnea: Secondary | ICD-10-CM | POA: Diagnosis not present

## 2024-07-12 DIAGNOSIS — I2510 Atherosclerotic heart disease of native coronary artery without angina pectoris: Secondary | ICD-10-CM | POA: Diagnosis not present

## 2024-07-12 DIAGNOSIS — R600 Localized edema: Secondary | ICD-10-CM

## 2024-07-12 DIAGNOSIS — E785 Hyperlipidemia, unspecified: Secondary | ICD-10-CM | POA: Diagnosis not present

## 2024-07-12 NOTE — Progress Notes (Signed)
 Cardiology Office Note   Date:  07/12/2024  ID:  Tesneem, Dufrane March 02, 1951, MRN [id number] PCP: Auston Reyes BIRCH, MD  [facility] Providers Cardiologist:  Lonni Hanson, MD    History of Present Illness Janet Osborne is a 73 y.o. female with a hx of nonobstructive CAD by cardiac CTA in 2020, HTN, remote tobacco use (quit 2020), HLD, COPD, OSA not compliant with CPAP, thyroid  disease who presents for overdue follow-up.    Exercise stress echo 2019 showed normal LVEF 55% with hyperdynamic function with stress, no WMA, trivial AI, mild MR, mild TR. Cardiac CTA 07/2019 showed LAD with mixed proximal and mid vessel disease with stenosis up to 51-69% (CT FFR 0.85), Lcx and RCA normal. Calcium  score of 191, aortic atherosclerosis noted.    Patient was seen 12/2020 reporting shortness of breath. An echo was ordered. Also encouraged to see pulmonology and use CPAP regularly. Echo showed LVEF 60-65%, no WMA and normal diastolic parameters.  Patient was last seen 08/16/2021 reporting she had bronchitis and was on a Z-Pak.  She reported she was not using CPAP regularly due to the mask not fitting and she was referred to pulmonology.  Today, the patient was on prednisone  and feet and ankles were swollen. This was abnormal for the patient, so she came in. Yesterday was the last day of steroids. Swelling is improving. She was on steroids for her knee. She starts PT tomorrow. She denies chest pain or SOB. She denies lightheadedness, dizziness, palpitations, heart racing.   Studies Reviewed EKG Interpretation Date/Time:  Tuesday July 12 2024 08:36:15 EDT Ventricular Rate:  56 PR Interval:  116 QRS Duration:  80 QT Interval:  408 QTC Calculation: 393 R Axis:   25  Text Interpretation: Sinus bradycardia When compared with ECG of 24-May-2019 13:27, No significant change was found Confirmed by Franchester, Zyanne Schumm ([id number]) on 07/12/2024 8:39:06 AM     Echo 2022 1. Left ventricular ejection  fraction, by estimation, is 60 to 65%. Left  ventricular ejection fraction by 3D volume is 68 %. The left ventricle has  normal function. The left ventricle has no regional wall motion  abnormalities. Left ventricular diastolic   parameters were normal. The average left ventricular global longitudinal  strain is -22.0 %. The global longitudinal strain is normal.   2. Right ventricular systolic function is normal. The right ventricular  size is normal.   3. The mitral valve is normal in structure. Trivial mitral valve  regurgitation.   4. The aortic valve was not well visualized. Aortic valve regurgitation  is not visualized.   5. The inferior vena cava is normal in size with greater than 50%  respiratory variability, suggesting right atrial pressure of 3 mmHg.   Cardiac CT 07/2019  IMPRESSION: 1. Coronary artery calcium  score 191 Agatston units. This places the patient in the 88th percentile for age and gender, suggesting high risk for future cardiac events.   2. Moderate proximal LAD stenosis. Will send for FFR to assess for hemodynamic significance. FINDINGS: FFR 0.86 mid LAD.   IMPRESSION: No evidence for hemodynamically significant stenosis.       Physical Exam VS:  BP 134/70 (BP Location: Left Arm, Patient Position: Sitting, Cuff Size: Normal)   Pulse (!) 56   Ht 5' (1.524 m)   Wt 163 lb 6.4 oz (74.1 kg)   SpO2 98%   BMI 31.91 kg/m        Wt Readings from Last 3 Encounters:  07/12/24 163  lb 6.4 oz (74.1 kg)  09/18/22 160 lb 3.2 oz (72.7 kg)  09/11/21 161 lb (73 kg)    GEN: Well nourished, well developed in no acute distress NECK: No JVD; No carotid bruits CARDIAC: bradycardia, RR, no murmurs, rubs, gallops RESPIRATORY:  Clear to auscultation without rales, wheezing or rhonchi  ABDOMEN: Soft, non-tender, non-distended EXTREMITIES:  No edema; No deformity   ASSESSMENT AND PLAN  DOE Nonobstructive CAD Patient has a long history of dyspnea on exertion.  Likely  COPD is contributing.  She reports shortness of breath and diaphoresis with mild household activities.  She denies any chest pain.  Cardiac CTA in 2020 showed moderate LAD stenosis with negative FFR.  I will order a cardiac PET stress.  Continue aspirin and statin therapy.  LLE Patient with on prednisone  for her knee, last dose was yesterday.  She had swelling on her lower leg and feet that has improved.  On exam she appears euvolemic, no significant swelling noted.  We will continue hydrochlorothiazide 25 mg daily.  I will update an echocardiogram.  Prior echocardiogram showed normal EF and normal diastolic parameters.  Hypertension Blood pressure is good today.  Continue hydrochlorothiazide and losartan.  Hyperlipidemia LDL 65.  Continue Crestor  20 mg daily.    Informed Consent   Shared Decision Making/Informed Consent The risks [chest pain, shortness of breath, cardiac arrhythmias, dizziness, blood pressure fluctuations, myocardial infarction, stroke/transient ischemic attack, nausea, vomiting, allergic reaction, radiation exposure, metallic taste sensation and life-threatening complications (estimated to be 1 in 10,000)], benefits (risk stratification, diagnosing coronary artery disease, treatment guidance) and alternatives of a cardiac PET stress test were discussed in detail with Janet Osborne and she agrees to proceed.     Dispo: Follow-up in 4-6 weeks  Signed, Marylynne Keelin [name], PA-C

## 2024-07-12 NOTE — Patient Instructions (Signed)
 Medication Instructions:  Your physician recommends that you continue on your current medications as directed. Please refer to the Current Medication list given to you today.    *If you need a refill on your cardiac medications before your next appointment, please call your pharmacy*  Lab Work: No labs ordered today    Testing/Procedures: Your physician has requested that you have an echocardiogram. Echocardiography is a painless test that uses sound waves to create images of your heart. It provides your doctor with information about the size and shape of your heart and how well your heart's chambers and valves are working.   You may receive an ultrasound enhancing agent through an IV if needed to better visualize your heart during the echo. This procedure takes approximately one hour.  There are no restrictions for this procedure.  This will take place at [address] [facility] [address], [place] [address]  Please note: We ask at that you not bring children with you during ultrasound (echo/ vascular) testing. Due to room size and safety concerns, children are not allowed in the ultrasound rooms during exams. Our front office staff cannot provide observation of children in our lobby area while testing is being conducted. An adult accompanying a patient to their appointment will only be allowed in the ultrasound room at the discretion of the ultrasound technician under special circumstances. We apologize for any inconvenience.      Please report to Radiology at the [facility] Main Entrance 30 minutes early for your test.  [address] [place], [place] [address]                         OR   Please report to Radiology at [facility] Main Entrance, medical mall, 30 mins prior to your test.  [address]  [place], [place]  How to Prepare for Your Cardiac PET/CT Stress Test:  Nothing to eat or drink, except water, 3 hours prior to  arrival time.  NO caffeine/decaffeinated products, or chocolate 12 hours prior to arrival. (Please note decaffeinated beverages (teas/coffees) still contain caffeine).  If you have caffeine within 12 hours prior, the test will need to be rescheduled.  Medication instructions: Do not take erectile dysfunction medications for 72 hours prior to test (sildenafil, tadalafil) Do not take nitrates (isosorbide mononitrate, Ranexa) the day before or day of test Do not take tamsulosin the day before or morning of test Hold theophylline containing medications for 12 hours. Hold Dipyridamole 48 hours prior to the test.  Diabetic Preparation: If able to eat breakfast prior to 3 hour fasting, you may take all medications, including your insulin. Do not worry if you miss your breakfast dose of insulin - start at your next meal. If you do not eat prior to 3 hour fast-Hold all diabetes (oral and insulin) medications. Patients who wear a continuous glucose monitor MUST remove the device prior to scanning.  You may take your remaining medications with water.  NO perfume, cologne or lotion on chest or abdomen area. FEMALES - Please avoid wearing dresses to this appointment.  Total time is 1 to 2 hours; you may want to bring reading material for the waiting time.  IF YOU THINK YOU MAY BE PREGNANT, OR ARE NURSING PLEASE INFORM THE TECHNOLOGIST.  In preparation for your appointment, medication and supplies will be purchased.  Appointment availability is limited, so if you need to cancel or reschedule, please  call the Radiology Department Scheduler at [phone] 24 hours in advance to avoid a cancellation fee of $100.00  What to Expect When you Arrive:  Once you arrive and check in for your appointment, you will be taken to a preparation room within the Radiology Department.  A technologist or Nurse will obtain your medical history, verify that you are correctly prepped for the exam, and explain the procedure.   Afterwards, an IV will be started in your arm and electrodes will be placed on your skin for EKG monitoring during the stress portion of the exam. Then you will be escorted to the PET/CT scanner.  There, staff will get you positioned on the scanner and obtain a blood pressure and EKG.  During the exam, you will continue to be connected to the EKG and blood pressure machines.  A small, safe amount of a radioactive tracer will be injected in your IV to obtain a series of pictures of your heart along with an injection of a stress agent.    After your Exam:  It is recommended that you eat a meal and drink a caffeinated beverage to counter act any effects of the stress agent.  Drink plenty of fluids for the remainder of the day and urinate frequently for the first couple of hours after the exam.  Your doctor will inform you of your test results within 7-10 business days.  For more information and frequently asked questions, please visit our website: [email or web address]  For questions about your test or how to prepare for your test, please call: Cardiac Imaging Nurse Navigators Office: [phone]   Follow-Up: At [facility], you and your health needs are our priority.  As part of our continuing mission to provide you with exceptional heart care, our providers are all part of one team.  This team includes your primary Cardiologist (physician) and Advanced Practice Providers or APPs (Physician Assistants and Nurse Practitioners) who all work together to provide you with the care you need, when you need it.  Your next appointment:   4-6 week(s)  Provider:   Redell Cave, MD or Cadence Franchester, PA-C

## 2024-07-12 NOTE — Addendum Note (Signed)
 Addended by: DESIDERIO RUSSELL SAILOR on: 07/12/2024 09:12 AM   Modules accepted: Orders

## 2024-07-13 DIAGNOSIS — M1711 Unilateral primary osteoarthritis, right knee: Secondary | ICD-10-CM | POA: Diagnosis not present

## 2024-07-20 DIAGNOSIS — M1711 Unilateral primary osteoarthritis, right knee: Secondary | ICD-10-CM | POA: Diagnosis not present

## 2024-07-27 DIAGNOSIS — M1711 Unilateral primary osteoarthritis, right knee: Secondary | ICD-10-CM | POA: Diagnosis not present

## 2024-08-03 ENCOUNTER — Ambulatory Visit: Attending: Medical

## 2024-08-03 DIAGNOSIS — R0609 Other forms of dyspnea: Secondary | ICD-10-CM | POA: Diagnosis not present

## 2024-08-03 DIAGNOSIS — M1711 Unilateral primary osteoarthritis, right knee: Secondary | ICD-10-CM | POA: Diagnosis not present

## 2024-08-03 LAB — ECHOCARDIOGRAM COMPLETE
AR max vel: 2.27 cm2
AV Area VTI: 2.16 cm2
AV Area mean vel: 1.95 cm2
AV Mean grad: 4 mmHg
AV Peak grad: 7.6 mmHg
Ao pk vel: 1.38 m/s
Area-P 1/2: 3.16 cm2
MV VTI: 2.21 cm2
S' Lateral: 3.1 cm
Single Plane A4C EF: 59.8 %

## 2024-08-04 ENCOUNTER — Ambulatory Visit: Payer: Self-pay | Admitting: Medical

## 2024-08-06 ENCOUNTER — Encounter (HOSPITAL_COMMUNITY): Payer: Self-pay

## 2024-08-10 DIAGNOSIS — M2241 Chondromalacia patellae, right knee: Secondary | ICD-10-CM | POA: Diagnosis not present

## 2024-08-10 DIAGNOSIS — M1711 Unilateral primary osteoarthritis, right knee: Secondary | ICD-10-CM | POA: Diagnosis not present

## 2024-08-11 ENCOUNTER — Ambulatory Visit
Admission: RE | Admit: 2024-08-11 | Discharge: 2024-08-11 | Disposition: A | Discharge status: Home / Self Care | Source: Ambulatory Visit | Attending: Medical | Admitting: Medical

## 2024-08-11 DIAGNOSIS — R0609 Other forms of dyspnea: Secondary | ICD-10-CM | POA: Insufficient documentation

## 2024-08-11 DIAGNOSIS — I700 Atherosclerosis of aorta: Secondary | ICD-10-CM | POA: Insufficient documentation

## 2024-08-11 LAB — NM PET CT CARDIAC PERFUSION MULTI W/ABSOLUTE BLOODFLOW
LV dias vol: 65 mL (ref 46–106)
MBFR: 3.34
Nuc Rest EF: 63 %
Nuc Stress EF: 68 %
Peak HR: 92 {beats}/min
Rest HR: 55 {beats}/min
Rest MBF: 0.77 ml/g/min
Rest Nuclear Isotope Dose: 19.3 mCi
SRS: 0
SSS: 0
ST Depression (mm): 0 mm
Stress MBF: 2.57 ml/g/min
Stress Nuclear Isotope Dose: 19.4 mCi
TID: 0.97

## 2024-08-11 MED ORDER — REGADENOSON 0.4 MG/5ML IV SOLN
0.4000 mg | Freq: Once | INTRAVENOUS | Status: AC
  Administered 2024-08-11: 0.4 mg via INTRAVENOUS
  Filled 2024-08-11: qty 5

## 2024-08-11 MED ORDER — REGADENOSON 0.4 MG/5ML IV SOLN
INTRAVENOUS | Status: AC
  Filled 2024-08-11: qty 5

## 2024-08-11 MED ORDER — RUBIDIUM RB82 GENERATOR (RUBYFILL)
25.0000 | PACK | Freq: Once | INTRAVENOUS | Status: AC
  Administered 2024-08-11: 19.28 via INTRAVENOUS

## 2024-08-11 MED ORDER — DEXTROSE 5 % IV SOLN
INTRAVENOUS | Status: AC
  Filled 2024-08-11: qty 50

## 2024-08-11 MED ORDER — CAFFEINE CITRATE BASE COMPONENT 10 MG/ML IV SOLN
INTRAVENOUS | Status: AC
  Filled 2024-08-11: qty 3

## 2024-08-11 MED ORDER — RUBIDIUM RB82 GENERATOR (RUBYFILL)
25.0000 | PACK | Freq: Once | INTRAVENOUS | Status: AC
  Administered 2024-08-11: 19.42 via INTRAVENOUS

## 2024-08-12 DIAGNOSIS — G4700 Insomnia, unspecified: Secondary | ICD-10-CM | POA: Diagnosis not present

## 2024-08-12 DIAGNOSIS — M2550 Pain in unspecified joint: Secondary | ICD-10-CM | POA: Diagnosis not present

## 2024-08-17 ENCOUNTER — Ambulatory Visit: Attending: Medical | Admitting: Medical

## 2024-08-17 ENCOUNTER — Ambulatory Visit: Admitting: Medical

## 2024-08-17 ENCOUNTER — Encounter: Payer: Self-pay | Admitting: Medical

## 2024-08-17 VITALS — BP 110/68 | HR 63 | Ht 60.0 in | Wt 161.2 lb

## 2024-08-17 DIAGNOSIS — I10 Essential (primary) hypertension: Secondary | ICD-10-CM | POA: Diagnosis not present

## 2024-08-17 DIAGNOSIS — I059 Rheumatic mitral valve disease, unspecified: Secondary | ICD-10-CM | POA: Diagnosis not present

## 2024-08-17 DIAGNOSIS — I2510 Atherosclerotic heart disease of native coronary artery without angina pectoris: Secondary | ICD-10-CM | POA: Diagnosis not present

## 2024-08-17 DIAGNOSIS — I5032 Chronic diastolic (congestive) heart failure: Secondary | ICD-10-CM | POA: Diagnosis not present

## 2024-08-17 DIAGNOSIS — E782 Mixed hyperlipidemia: Secondary | ICD-10-CM

## 2024-08-17 NOTE — Patient Instructions (Signed)
 Medication Instructions:   Your physician recommends that you continue on your current medications as directed. Please refer to the Current Medication list given to you today.   *If you need a refill on your cardiac medications before your next appointment, please call your pharmacy*  Lab Work:  No labs ordered today   If you have labs (blood work) drawn today and your tests are completely normal, you will receive your results only by: MyChart Message (if you have MyChart) OR A paper copy in the mail If you have any lab test that is abnormal or we need to change your treatment, we will call you to review the results.  Testing/Procedures:  No test ordered today   Follow-Up: At [facility], you and your health needs are our priority.  As part of our continuing mission to provide you with exceptional heart care, our providers are all part of one team.  This team includes your primary Cardiologist (physician) and Advanced Practice Providers or APPs (Physician Assistants and Nurse Practitioners) who all work together to provide you with the care you need, when you need it.  Your next appointment:    6 month(s)  Provider:    You may see Lonni Hanson, MD or one of the following Advanced Practice Providers on your designated Care Team:   Lonni Meager, NP Lesley Maffucci, PA-C Bernardino Bring, PA-C Cadence [place], PA-C Tylene Lunch, NP Barnie Hila, NP

## 2024-08-17 NOTE — Progress Notes (Unsigned)
 Cardiology Office Note   Date:  08/18/2024  ID:  Janet Osborne, Janet Osborne May 15, 1951, MRN [id number] PCP: Auston Reyes BIRCH, MD   HeartCare Providers Cardiologist:  Lonni Hanson, MD   History of Present Illness Janet Osborne is a 73 y.o. female with a hx of nonobstructive CAD by cardiac CTA in 2020, HTN, remote tobacco use (quit 2020), HLD, COPD, OSA not compliant with CPAP, thyroid  disease who presents for follow-up.    Exercise stress echo 2019 showed normal LVEF 55% with hyperdynamic function with stress, no WMA, trivial AI, mild MR, mild TR. Cardiac CTA 07/2019 showed LAD with mixed proximal and mid vessel disease with stenosis up to 51-69% (CT FFR 0.85), Lcx and RCA normal. Calcium  score of 191, aortic atherosclerosis noted.    Patient was seen 12/2020 reporting shortness of breath. An echo was ordered. Also encouraged to see pulmonology and use CPAP regularly. Echo showed LVEF 60-65%, no WMA and normal diastolic parameters.   The patient was last seen 07/12/24 on prednisone  and had swelling in the feet and ankles.  She had recently finished her steroids and swelling was improving.  She reported shortness of breath with household activities.  Cardiac PET stress was ordered.  This showed no evidence of ischemia, normal pulm function, normal perfusion, low risk study.  Today, the patient reports swelling has resolved. She denies chest pain or shortness of breath. She denies dizziness or lightheadedness. She started cymbalta for pain to help her sleep at night.   Studies Reviewed   MPI 08/2024       LV perfusion is normal. There is no evidence of ischemia. There is no evidence of infarction.   Rest left ventricular function is normal. Rest EF: 63%. Stress left ventricular function is normal. Stress EF: 68%. End diastolic cavity size is normal.   Myocardial blood flow was computed to be 0.32ml/g/min at rest and 2.57ml/g/min at stress. Global myocardial blood flow reserve was  3.34 and was normal.   Coronary calcium  was present on the attenuation correction CT images. Moderate coronary calcifications were present. Coronary calcifications were present in the left anterior descending artery and right coronary artery distribution(s).   The study is normal. The study is low risk.  Echo 07/2024 1. Left ventricular ejection fraction, by estimation, is 55 to 60%. Left  ventricular ejection fraction by PLAX is 57 %. The left ventricle has  normal function. The left ventricle has no regional wall motion  abnormalities. Left ventricular diastolic  parameters are indeterminate.   2. Right ventricular systolic function is normal. The right ventricular  size is normal. Tricuspid regurgitation signal is inadequate for assessing  PA pressure.   3. Left atrial size was mildly dilated.   4. The mitral valve is normal in structure. Mild to moderate mitral valve  regurgitation. No evidence of mitral stenosis.   5. The aortic valve is normal in structure. Aortic valve regurgitation is  not visualized. No aortic stenosis is present.   6. The inferior vena cava is normal in size with greater than 50%  respiratory variability, suggesting right atrial pressure of 3 mmHg.   Echo 2022 1. Left ventricular ejection fraction, by estimation, is 60 to 65%. Left  ventricular ejection fraction by 3D volume is 68 %. The left ventricle has  normal function. The left ventricle has no regional wall motion  abnormalities. Left ventricular diastolic   parameters were normal. The average left ventricular global longitudinal  strain is -22.0 %. The global longitudinal  strain is normal.   2. Right ventricular systolic function is normal. The right ventricular  size is normal.   3. The mitral valve is normal in structure. Trivial mitral valve  regurgitation.   4. The aortic valve was not well visualized. Aortic valve regurgitation  is not visualized.   5. The inferior vena cava is normal in size  with greater than 50%  respiratory variability, suggesting right atrial pressure of 3 mmHg.    Cardiac CT 07/2019  IMPRESSION: 1. Coronary artery calcium  score 191 Agatston units. This places the patient in the 88th percentile for age and gender, suggesting high risk for future cardiac events.   2. Moderate proximal LAD stenosis. Will send for FFR to assess for hemodynamic significance. FINDINGS: FFR 0.86 mid LAD.   IMPRESSION: No evidence for hemodynamically significant stenosis.       Physical Exam VS:  BP 110/68 (BP Location: Left Arm, Patient Position: Sitting, Cuff Size: Normal)   Pulse 63   Ht 5' (1.524 m)   Wt 161 lb 4 oz (73.1 kg)   SpO2 96%   BMI 31.49 kg/m        Wt Readings from Last 3 Encounters:  08/17/24 161 lb 4 oz (73.1 kg)  07/12/24 163 lb 6.4 oz (74.1 kg)  09/18/22 160 lb 3.2 oz (72.7 kg)    GEN: Well nourished, well developed in no acute distress NECK: No JVD; No carotid bruits CARDIAC: RRR, no murmurs, rubs, gallops RESPIRATORY:  Clear to auscultation without rales, wheezing or rhonchi  ABDOMEN: Soft, non-tender, non-distended EXTREMITIES:  No edema; No deformity   ASSESSMENT AND PLAN  Nonobstructive CAD Cardiac CTA in 2020 showed moderate LAD stenosis and negative FFR. Recent Myoview Lexiscan  for SOB was low risk without evidence of ischemia or prior infarction, it did show moderate coronary artery calcifications.  Patient denies any further chest pain or worsening shortness of breath. We will continue aspirin and statin therapy.  HFpEF Lower leg edema has improved since prednisone  was stopped.  She is on hydrochlorothiazide 25 mg daily.  Mild to moderate MR Recent echo showed normal pulm function with mild to moderate MR.  We can update echo every 1 to 3 years.  Hypertension Blood pressure today is normal, continue hydrochlorothiazide and losartan.  Hyperlipidemia LDL 65.  Continue Crestor  20 mg daily.       Dispo: Follow-up in 6  months  Signed, Monike Bragdon [name], PA-C

## 2024-08-17 NOTE — Progress Notes (Deleted)
  Cardiology Office Note   Date:  08/17/2024  ID:  Carrina, Schoenberger [birth year], MRN [id number] PCP: Auston Reyes BIRCH, MD  [facility] Providers Cardiologist:  Lonni Hanson, MD { Click to update primary MD,subspecialty MD or APP then REFRESH:1}    History of Present Illness Alveta Quintela is a 73 y.o. female with a hx of nonobstructive CAD by cardiac CTA in 2020, HTN, remote tobacco use (quit 2020), HLD, COPD, OSA not compliant with CPAP, thyroid  disease who presents for overdue follow-up.    Exercise stress echo 2019 showed normal LVEF 55% with hyperdynamic function with stress, no WMA, trivial AI, mild MR, mild TR. Cardiac CTA 07/2019 showed LAD with mixed proximal and mid vessel disease with stenosis up to 51-69% (CT FFR 0.85), Lcx and RCA normal. Calcium  score of 191, aortic atherosclerosis noted.    Patient was seen 12/2020 reporting shortness of breath. An echo was ordered. Also encouraged to see pulmonology and use CPAP regularly. Echo showed LVEF 60-65%, no WMA and normal diastolic parameters.  The patient was last seen 07/12/24 on prednisone  and had swelling in the feet and ankles.  She had recently finished her steroids and swelling was improving.  She reported shortness of breath with household activities.  Cardiac PET stress was ordered.  This showed no evidence of ischemia, normal pulm function, normal perfusion, low risk study.  Today,   ROS: ***  Studies Reviewed      *** Risk Assessment/Calculations {Does this patient have ATRIAL FIBRILLATION?:[phone]} No BP recorded.  {Refresh Note OR Click here to enter BP  :1}***       Physical Exam VS:  There were no vitals taken for this visit.       Wt Readings from Last 3 Encounters:  07/12/24 163 lb 6.4 oz (74.1 kg)  09/18/22 160 lb 3.2 oz (72.7 kg)  09/11/21 161 lb (73 kg)    GEN: Well nourished, well developed in no acute distress NECK: No JVD; No carotid bruits CARDIAC: ***RRR, no murmurs, rubs,  gallops RESPIRATORY:  Clear to auscultation without rales, wheezing or rhonchi  ABDOMEN: Soft, non-tender, non-distended EXTREMITIES:  No edema; No deformity   ASSESSMENT AND PLAN ***    {Are you ordering a CV Procedure (e.g. stress test, cath, DCCV, TEE, etc)?   Press F2        :[id number]}  Dispo: ***  Signed, Jovon Streetman [name], PA-C

## 2024-09-01 DIAGNOSIS — M1711 Unilateral primary osteoarthritis, right knee: Secondary | ICD-10-CM | POA: Diagnosis not present

## 2024-09-02 ENCOUNTER — Other Ambulatory Visit: Payer: Self-pay | Admitting: Acute Care

## 2024-09-02 DIAGNOSIS — Z87891 Personal history of nicotine dependence: Secondary | ICD-10-CM

## 2024-09-02 DIAGNOSIS — Z122 Encounter for screening for malignant neoplasm of respiratory organs: Secondary | ICD-10-CM

## 2024-09-08 DIAGNOSIS — M1711 Unilateral primary osteoarthritis, right knee: Secondary | ICD-10-CM | POA: Diagnosis not present

## 2024-09-15 DIAGNOSIS — M1711 Unilateral primary osteoarthritis, right knee: Secondary | ICD-10-CM | POA: Diagnosis not present

## 2024-09-28 DIAGNOSIS — M1612 Unilateral primary osteoarthritis, left hip: Secondary | ICD-10-CM | POA: Diagnosis not present

## 2024-11-18 DIAGNOSIS — D1801 Hemangioma of skin and subcutaneous tissue: Secondary | ICD-10-CM | POA: Diagnosis not present

## 2024-11-18 DIAGNOSIS — L821 Other seborrheic keratosis: Secondary | ICD-10-CM | POA: Diagnosis not present
